# Patient Record
Sex: Male | Born: 1945
Health system: Southern US, Community
[De-identification: ages and names within clinical notes are randomized; demographics above are authoritative.]

## PROBLEM LIST (undated history)

## (undated) DIAGNOSIS — K08109 Complete loss of teeth, unspecified cause, unspecified class: Secondary | ICD-10-CM

## (undated) DIAGNOSIS — N400 Enlarged prostate without lower urinary tract symptoms: Secondary | ICD-10-CM

## (undated) DIAGNOSIS — I4819 Other persistent atrial fibrillation: Secondary | ICD-10-CM

## (undated) DIAGNOSIS — D3502 Benign neoplasm of left adrenal gland: Secondary | ICD-10-CM

## (undated) DIAGNOSIS — Z8719 Personal history of other diseases of the digestive system: Secondary | ICD-10-CM

## (undated) DIAGNOSIS — H409 Unspecified glaucoma: Secondary | ICD-10-CM

## (undated) DIAGNOSIS — D3501 Benign neoplasm of right adrenal gland: Secondary | ICD-10-CM

## (undated) DIAGNOSIS — N529 Male erectile dysfunction, unspecified: Secondary | ICD-10-CM

## (undated) DIAGNOSIS — Z86010 Personal history of colonic polyps: Secondary | ICD-10-CM

## (undated) DIAGNOSIS — K5730 Diverticulosis of large intestine without perforation or abscess without bleeding: Secondary | ICD-10-CM

## (undated) DIAGNOSIS — Z9189 Other specified personal risk factors, not elsewhere classified: Secondary | ICD-10-CM

## (undated) DIAGNOSIS — M1990 Unspecified osteoarthritis, unspecified site: Secondary | ICD-10-CM

## (undated) DIAGNOSIS — Z7901 Long term (current) use of anticoagulants: Secondary | ICD-10-CM

## (undated) DIAGNOSIS — R011 Cardiac murmur, unspecified: Secondary | ICD-10-CM

## (undated) DIAGNOSIS — I4891 Unspecified atrial fibrillation: Secondary | ICD-10-CM

## (undated) DIAGNOSIS — K219 Gastro-esophageal reflux disease without esophagitis: Secondary | ICD-10-CM

## (undated) DIAGNOSIS — Z860101 Personal history of adenomatous and serrated colon polyps: Secondary | ICD-10-CM

## (undated) DIAGNOSIS — E119 Type 2 diabetes mellitus without complications: Secondary | ICD-10-CM

## (undated) DIAGNOSIS — Z972 Presence of dental prosthetic device (complete) (partial): Secondary | ICD-10-CM

## (undated) DIAGNOSIS — E785 Hyperlipidemia, unspecified: Secondary | ICD-10-CM

## (undated) DIAGNOSIS — I10 Essential (primary) hypertension: Secondary | ICD-10-CM

## (undated) DIAGNOSIS — I491 Atrial premature depolarization: Secondary | ICD-10-CM

## (undated) DIAGNOSIS — G629 Polyneuropathy, unspecified: Secondary | ICD-10-CM

## (undated) DIAGNOSIS — C679 Malignant neoplasm of bladder, unspecified: Secondary | ICD-10-CM

## (undated) HISTORY — DX: Malignant neoplasm of bladder, unspecified: C67.9

## (undated) HISTORY — PX: COLONOSCOPY W/ POLYPECTOMY: SHX1380

## (undated) HISTORY — PX: COLONOSCOPY WITH ESOPHAGOGASTRODUODENOSCOPY (EGD): SHX5779

## (undated) HISTORY — DX: Unspecified atrial fibrillation: I48.91

---

## 2002-03-23 ENCOUNTER — Encounter: Admission: RE | Admit: 2002-03-23 | Discharge: 2002-06-21 | Payer: Self-pay | Admitting: Internal Medicine

## 2002-06-07 ENCOUNTER — Ambulatory Visit (HOSPITAL_COMMUNITY): Admission: RE | Admit: 2002-06-07 | Discharge: 2002-06-07 | Payer: Self-pay | Admitting: Gastroenterology

## 2002-06-07 ENCOUNTER — Encounter (INDEPENDENT_AMBULATORY_CARE_PROVIDER_SITE_OTHER): Payer: Self-pay | Admitting: *Deleted

## 2003-05-24 ENCOUNTER — Inpatient Hospital Stay (HOSPITAL_COMMUNITY): Admission: RE | Admit: 2003-05-24 | Discharge: 2003-05-26 | Payer: Self-pay | Admitting: Neurological Surgery

## 2003-05-24 HISTORY — PX: ANTERIOR CERVICAL DECOMP/DISCECTOMY FUSION: SHX1161

## 2003-06-18 ENCOUNTER — Encounter: Admission: RE | Admit: 2003-06-18 | Discharge: 2003-06-18 | Payer: Self-pay | Admitting: Neurological Surgery

## 2003-07-16 ENCOUNTER — Encounter: Admission: RE | Admit: 2003-07-16 | Discharge: 2003-07-16 | Payer: Self-pay | Admitting: Neurological Surgery

## 2003-08-27 ENCOUNTER — Encounter: Admission: RE | Admit: 2003-08-27 | Discharge: 2003-08-27 | Payer: Self-pay | Admitting: Neurological Surgery

## 2003-11-20 ENCOUNTER — Encounter: Admission: RE | Admit: 2003-11-20 | Discharge: 2003-11-20 | Payer: Self-pay | Admitting: Neurological Surgery

## 2004-07-25 IMAGING — CR DG CERVICAL SPINE 1V
1 series · 1 of 1 positions shown · non-contrast
Comparison: none

CLINICAL DATA: Left arm pain and numbness.  Cervical fusion on 05/24/03.
 SINGLE VIEW CERVICAL SPINE ? 08/27/03 
 Comparison 07/16/03.
 A single lateral view of the cervical spine again demonstrates interbody bone plug and anterior screw and plate fusion at the C4 through C7 levels with normal alignment.  
 IMPRESSION
 Stable anterior cervical fusion at the C4 through C7 levels with normal alignment.

[view not recorded]
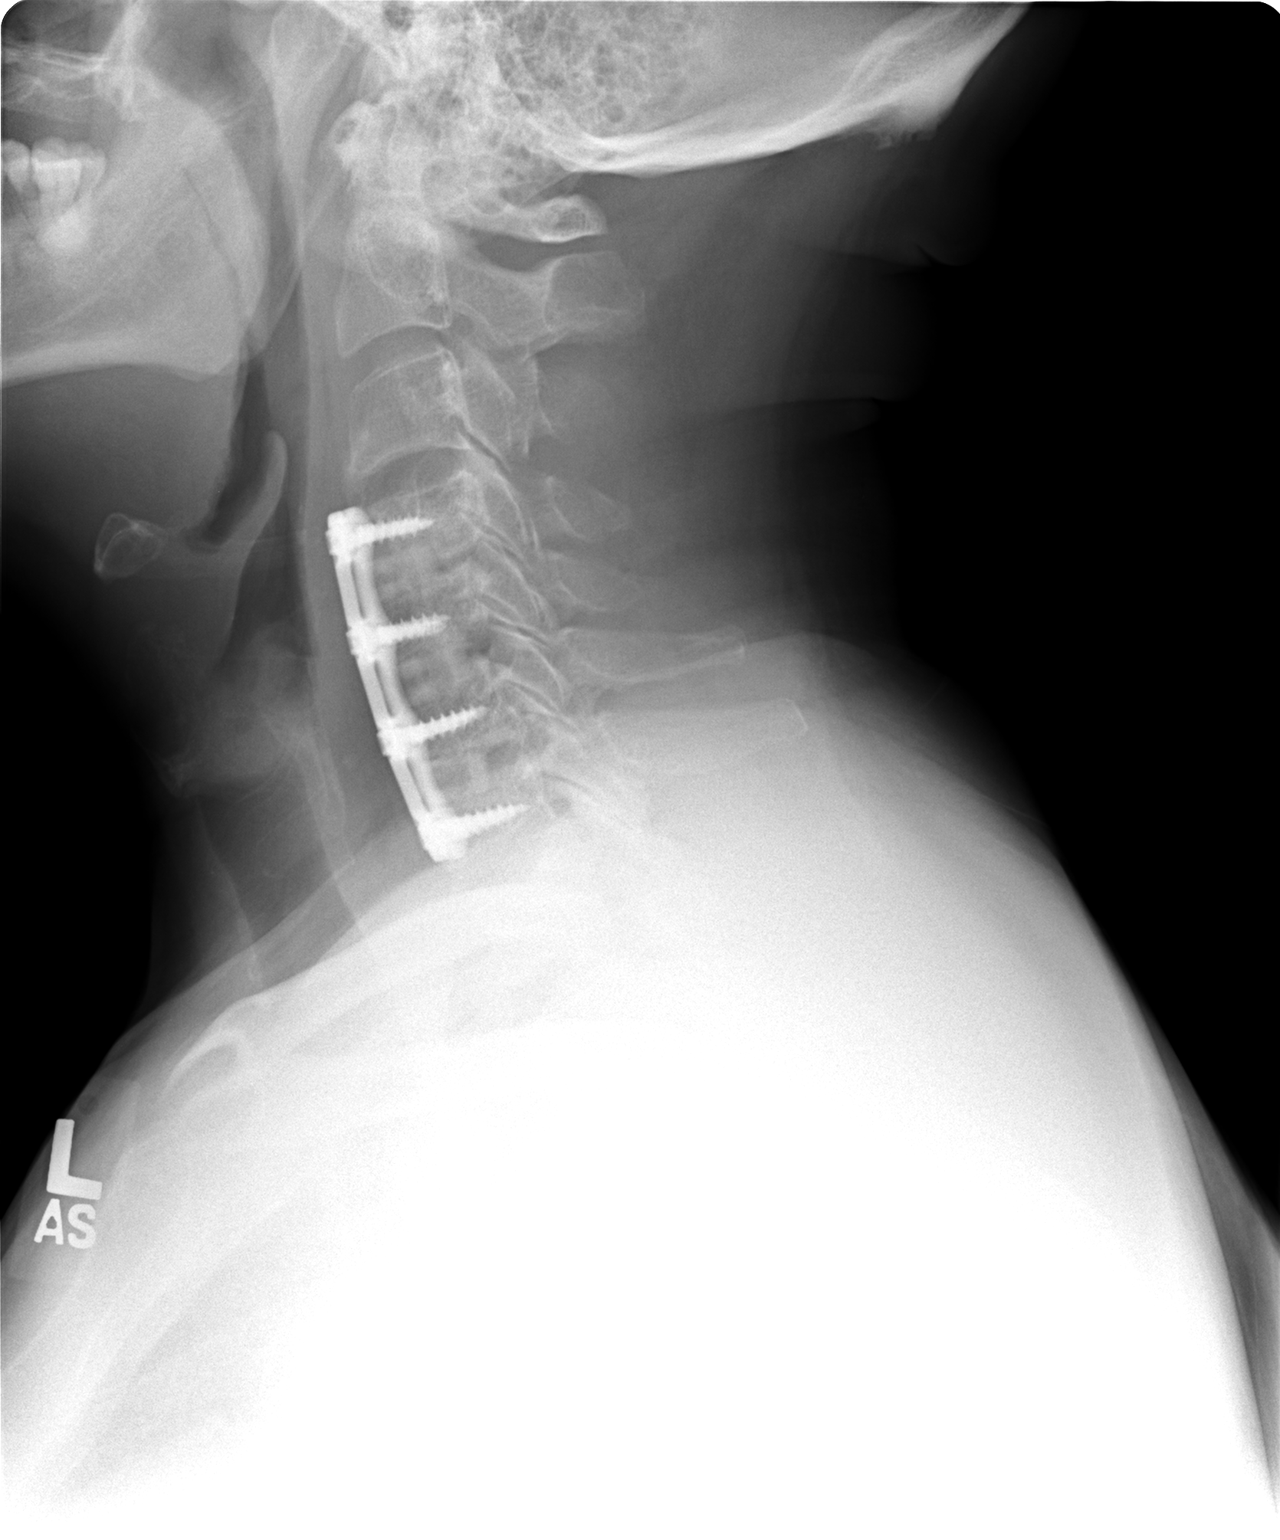

[1 of 1 positions shown; findings below may reference images not displayed]

## 2007-05-23 ENCOUNTER — Encounter: Payer: Self-pay | Admitting: Internal Medicine

## 2007-05-23 LAB — HM COLONOSCOPY

## 2008-11-09 ENCOUNTER — Encounter: Payer: Self-pay | Admitting: Internal Medicine

## 2008-11-09 LAB — CONVERTED CEMR LAB
ALT: 18 U/L
AST: 18 U/L
Albumin: 4.5 g/dL
Alkaline Phosphatase: 82 U/L
BUN: 12 mg/dL
CO2: 26 meq/L
Calcium: 10.3 mg/dL
Chloride: 99 meq/L
Cholesterol: 150 mg/dL
Creatinine, Ser: 0.96 mg/dL
Glucose, Bld: 148 mg/dL
HDL: 46 mg/dL
Hgb A1c MFr Bld: 6.7 %
LDL Cholesterol: 78 mg/dL
Potassium: 4.1 meq/L
Sodium: 140 meq/L
Total Bilirubin: 0.5 mg/dL
Total Protein: 7.3 g/dL
Triglyceride fasting, serum: 129 mg/dL

## 2009-03-15 ENCOUNTER — Encounter: Payer: Self-pay | Admitting: Internal Medicine

## 2009-03-15 LAB — CONVERTED CEMR LAB: Hgb A1c MFr Bld: 6.2 %

## 2009-05-31 ENCOUNTER — Encounter: Payer: Self-pay | Admitting: Internal Medicine

## 2009-05-31 LAB — CONVERTED CEMR LAB
ALT: 17 U/L
AST: 23 U/L
Albumin: 4.4 g/dL
Alkaline Phosphatase: 73 U/L
BUN: 15 mg/dL
CO2: 21 meq/L
Calcium: 10.3 mg/dL
Chloride: 103 meq/L
Cholesterol: 140 mg/dL
Creatinine, Ser: 1.02 mg/dL
Glucose, Bld: 109 mg/dL
HDL: 44 mg/dL
LDL Cholesterol: 68 mg/dL
Potassium: 3.4 meq/L
Sodium: 141 meq/L
Total Bilirubin: 0.6 mg/dL
Total Protein: 7.2 g/dL
Triglyceride fasting, serum: 138 mg/dL

## 2009-12-23 ENCOUNTER — Ambulatory Visit: Payer: Self-pay | Admitting: Internal Medicine

## 2009-12-23 DIAGNOSIS — Z860100 Personal history of colon polyps, unspecified: Secondary | ICD-10-CM | POA: Insufficient documentation

## 2009-12-23 DIAGNOSIS — E785 Hyperlipidemia, unspecified: Secondary | ICD-10-CM | POA: Insufficient documentation

## 2009-12-23 DIAGNOSIS — Z8719 Personal history of other diseases of the digestive system: Secondary | ICD-10-CM | POA: Insufficient documentation

## 2009-12-23 DIAGNOSIS — E1169 Type 2 diabetes mellitus with other specified complication: Secondary | ICD-10-CM | POA: Insufficient documentation

## 2009-12-23 DIAGNOSIS — I10 Essential (primary) hypertension: Secondary | ICD-10-CM | POA: Insufficient documentation

## 2009-12-23 DIAGNOSIS — E119 Type 2 diabetes mellitus without complications: Secondary | ICD-10-CM | POA: Insufficient documentation

## 2009-12-23 DIAGNOSIS — E1159 Type 2 diabetes mellitus with other circulatory complications: Secondary | ICD-10-CM | POA: Insufficient documentation

## 2009-12-23 LAB — CONVERTED CEMR LAB
ALT: 16 U/L
AST: 21 U/L
Albumin: 4.1 g/dL
Alkaline Phosphatase: 59 U/L
BUN: 12 mg/dL
Bilirubin, Direct: 0.2 mg/dL
CO2: 30 meq/L
Calcium: 10 mg/dL
Chloride: 101 meq/L
Cholesterol: 141 mg/dL
Creatinine, Ser: 0.9 mg/dL
GFR calc non Af Amer: 103.57 mL/min
Glucose, Bld: 125 mg/dL — ABNORMAL HIGH
HDL: 42.4 mg/dL
Hgb A1c MFr Bld: 6.7 % — ABNORMAL HIGH
LDL Cholesterol: 76 mg/dL
Potassium: 3.9 meq/L
Sodium: 141 meq/L
Total Bilirubin: 1 mg/dL
Total CHOL/HDL Ratio: 3
Total Protein: 6.8 g/dL
Triglycerides: 114 mg/dL
VLDL: 22.8 mg/dL

## 2009-12-23 LAB — HM DIABETES FOOT EXAM: HM Diabetic Foot Exam: NORMAL

## 2009-12-26 ENCOUNTER — Telehealth: Payer: Self-pay | Admitting: Internal Medicine

## 2010-01-02 ENCOUNTER — Encounter: Payer: Self-pay | Admitting: Internal Medicine

## 2010-03-09 DIAGNOSIS — Z8619 Personal history of other infectious and parasitic diseases: Secondary | ICD-10-CM

## 2010-03-09 HISTORY — DX: Personal history of other infectious and parasitic diseases: Z86.19

## 2010-03-26 ENCOUNTER — Ambulatory Visit
Admission: RE | Admit: 2010-03-26 | Discharge: 2010-03-26 | Payer: Self-pay | Source: Home / Self Care | Attending: Internal Medicine | Admitting: Internal Medicine

## 2010-03-26 ENCOUNTER — Other Ambulatory Visit: Payer: Self-pay | Admitting: Internal Medicine

## 2010-03-26 LAB — HEMOGLOBIN A1C: Hgb A1c MFr Bld: 6.8 % — ABNORMAL HIGH (ref 4.6–6.5)

## 2010-04-08 NOTE — Progress Notes (Signed)
Summary: COLONOSCOPY  Phone Note From Other Clinic   Caller: Dr Kenna Gilbert office  Summary of Call: Pt had colon w/Dr Loreta Ave in 2007 and is due 05/08/2010. They will fax colon and path report to my attention to document EMR.  Initial call taken by: Lamar Sprinkles, CMA,  December 26, 2009 2:32 PM  Follow-up for Phone Call        Info entered into EMR, report set to be scanned Follow-up by: Lamar Sprinkles, CMA,  December 26, 2009 4:45 PM  Additional Follow-up for Phone Call Additional follow up Details #1::        ok noted - thanks Additional Follow-up by: Newt Lukes MD,  December 27, 2009 10:12 AM         Preventive Care Screening  Colonoscopy:    Date:  05/23/2007    Next Due:  06/2010    Results:  Adenomatous Polyp

## 2010-04-08 NOTE — Procedures (Signed)
Summary: Colonoscopy/[facility]  Colonoscopy/[facility]   Imported By: Sherian Rein 01/01/2010 07:39:01  _____________________________________________________________________  External Attachment:    Type:   Image     Comment:   External Document

## 2010-04-08 NOTE — Assessment & Plan Note (Signed)
Summary: NEW PT/MEDICARE/#/LB   Vital Signs:  Patient profile:   65 year old male Height:      67 inches (170.18 cm) Weight:      204.4 pounds (92.91 kg) BMI:     32.13 O2 Sat:      96 % on Room air Temp:     98.5 degrees F (36.94 degrees C) oral Pulse rate:   81 / minute BP sitting:   140 / 72  (left arm) Cuff size:   large  Vitals Entered By: Orlan Leavens RMA (December 23, 2009 8:12 AM)  O2 Flow:  Room air CC: New patient Is Patient Diabetic? Yes Did you bring your meter with you today? No Pain Assessment Patient in pain? no        Primary Care Provider:  Newt Lukes MD  CC:  New patient.  History of Present Illness: new pt to me and our practice, here to est care prev followed at [facility] [place]  1) DM2 - reports compliance with ongoing medical treatment and no changes in medication dose or frequency. denies adverse side effects related to current therapy. checks sugars  <1 x/d, no signs or symptoms hypoglycemia  2) HTN - reports compliance with ongoing medical treatment and no changes in medication dose or frequency. denies adverse side effects related to current therapy.  no edema, HA or CP  3) dyslipidemia - on statin tx since 2009 - reports compliance with ongoing medical treatment and no changes in medication dose or frequency. denies adverse side effects related to current therapy.  no GI or muscle c/o  Preventive Screening-Counseling & Management  Alcohol-Tobacco     Alcohol drinks/day: <1     Smoking Status: current     Smoking Cessation Counseling: yes     Tobacco Counseling: to quit use of tobacco products  Caffeine-Diet-Exercise     Caffeine Counseling: not indicated; caffeine use is not excessive or problematic     Does Patient Exercise: no     Exercise Counseling: to improve exercise regimen  Safety-Violence-Falls     Seat Belt Counseling: not indicated; patient wears seat belts     Helmet Counseling: not applicable     Firearm  Counseling: not indicated; uses recommended firearm safety measures     Violence Counseling: not indicated; no violence risk noted     Fall Risk Counseling: not indicated; no significant falls noted  Clinical Review Panels:  Prevention   Last Colonoscopy:  pt states was done about 3 years ago polyp removed (03/09/2006)   Current Medications (verified): 1)  Metformin Hcl 500 Mg Tabs (Metformin Hcl) .... Take 1 Two Times A Day 2)  Lipitor 20 Mg Tabs (Atorvastatin Calcium) .... Take 1 By Mouth Once Daily 3)  Diltiazem Hcl Er Beads 180 Mg Xr24h-Cap (Diltiazem Hcl Er Beads) .... Take 1 Two Times A Day 4)  Lisinopril-Hydrochlorothiazide 20-12.5 Mg Tabs (Lisinopril-Hydrochlorothiazide) .... Take 1 Two Times A Day 5)  Terazosin Hcl 10 Mg Caps (Terazosin Hcl) .... 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 I have reviewed all other systems and they were negative.   Physical Exam  General:  alert, well-developed, well-nourished, and cooperative to examination.    Eyes:  vision grossly intact; pupils equal, round and reactive to light.  conjunctiva and lids normal.    Ears:  normal pinnae bilaterally, without erythema, swelling, or tenderness to palpation. TMs clear, without effusion, or cerumen impaction. Hearing grossly  normal bilaterally  Mouth:  teeth and gums in good repair; mucous membranes moist, without lesions or ulcers. oropharynx clear without exudate, no erythema.  Neck:  supple, full ROM, no masses, no thyromegaly; no thyroid nodules or tenderness. no JVD or carotid bruits.   Lungs:  normal respiratory effort, no intercostal retractions or use of accessory muscles; normal breath sounds bilaterally - no crackles and no wheezes.    Heart:  normal rate, regular rhythm, no murmur, and no rub. BLE without edema. normal DP pulses and normal cap refill in all 4 extremities    Abdomen:  soft, non-tender, normal bowel sounds, no distention; no masses and no appreciable hepatomegaly or splenomegaly.   Msk:  No deformity or scoliosis noted of thoracic or lumbar spine.   Neurologic:  alert & oriented X3 and cranial nerves II-XII symetrically intact.  strength normal in all extremities, sensation intact to light touch, and gait normal. speech fluent without dysarthria or aphasia; follows commands with good comprehension.  Skin:  no rashes, vesicles, ulcers, or erythema. No nodules or irregularity to palpation.  Psych:  Oriented X3, memory intact for recent and remote, normally interactive, good eye contact, not anxious appearing, not depressed appearing, and not agitated.     Diabetes Management Exam:    Foot Exam (with socks and/or shoes not present):       Sensory-Pinprick/Light touch:          Left medial foot (L-4): normal          Left dorsal foot (L-5): normal          Left lateral foot (S-1): normal          Right medial foot (L-4): normal          Right dorsal foot (L-5): normal          Right lateral foot (S-1): normal       Sensory-Monofilament:          Left foot: normal          Right foot: normal       Inspection:          Left foot: normal          Right foot: normal       Nails:          Left foot: normal          Right foot: normal   Impression & Recommendations:  Problem # 1:   HYPERTENSION (ICD-401.9)  His updated medication list for this problem includes:    Diltiazem Hcl Er Beads 180 Mg Xr24h-cap (Diltiazem hcl er beads) .Marland Kitchen... Take 1 two times a day    Lisinopril-hydrochlorothiazide 20-12.5 Mg Tabs (Lisinopril-hydrochlorothiazide) .Marland Kitchen... Take 1 two times a day    Terazosin Hcl 10 Mg Caps (Terazosin hcl) .Marland Kitchen... Take 1 by mouth once daily  Orders: TLB-BMP (Basic Metabolic Panel-BMET) (80048-METABOL)  BP today: 140/72  Problem # 2:  HYPERLIPIDEMIA (ICD-272.4) send for prior records to review His updated medication list for this problem includes:    Lipitor 20 Mg Tabs (Atorvastatin calcium) .Marland Kitchen... Take 1 by mouth once daily  Orders: TLB-Lipid Panel (80061-LIPID)  Problem # 3:  DIABETES MELLITUS, TYPE II (ICD-250.00) send for prior records to review  His updated medication list for this problem includes:    Metformin Hcl 500 Mg Tabs (Metformin hcl) .Marland Kitchen... Take 1 two times a day    Lisinopril-hydrochlorothiazide 20-12.5 Mg Tabs (Lisinopril-hydrochlorothiazide) .Marland Kitchen... Take 1 two times a day  Orders: TLB-A1C / Hgb A1C (Glycohemoglobin) (83036-A1C)  Problem # 4:  FAMILY HISTORY OF COLON CA 1ST DEGREE RELATIVE <60 (ICD-V16.0) reports screenng q76yrs, overdue now send for prior records and refer back to GI as per pt schedule (dr. Loreta Ave)   Orders: Gastroenterology Referral (GI)  Complete Medication List: 1)  Metformin Hcl 500 Mg Tabs (Metformin hcl) .... Take 1 two times a day 2)  Lipitor 20 Mg Tabs (Atorvastatin calcium) .... Take 1 by mouth once daily 3)  Diltiazem Hcl Er Beads 180 Mg Xr24h-cap (Diltiazem hcl er beads) .... Take 1 two times a day 4)  Lisinopril-hydrochlorothiazide 20-12.5 Mg Tabs (Lisinopril-hydrochlorothiazide) .... Take 1 two times a day 5)  Terazosin Hcl 10 Mg Caps (Terazosin hcl) .... Take 1 by mouth once daily  Other Orders: TLB-Hepatic/Liver Function Pnl (80076-HEPATIC)  Contraindications/Deferment of Procedures/Staging:     Test/Procedure: FLU VAX    Reason for deferment: patient declined   Patient Instructions: 1)  it was good to see you today. 2)  medications and history reviewed - no changes recommended at this time - refills done 90 days 3)  test(s) ordered today - your results will be posted on the phone tree for review in 48-72 hours from the time of test completion; call [phone] and enter your 9 digit MRN (listed above on this page, just below your name); if any changes need to be made or there are abnormal results, you will be contacted directly. 4)  will send for records from [facility] to review  5)  we'll make referral to dr. Loreta Ave for your colonoscopy . Our office will contact you regarding this appointment once made.  6)  Tobacco is very bad for your health and your loved ones! You Should stop smoking! 7)  Please schedule a follow-up appointment in 3-4 months to recheck diabetes, call sooner if problems.  Prescriptions: TERAZOSIN HCL 10 MG CAPS (TERAZOSIN HCL) take 1 by mouth once daily  #90 x 1   Entered and Authorized by:   Newt Lukes MD   Signed by:   Newt Lukes MD on 12/23/2009   Method used:   Electronically to        [facility] [address] [phone]* (retail)       [address]       [place], [place]  [address]       Ph: [phone]       Fax: [phone]   RxID:   [id number] LISINOPRIL-HYDROCHLOROTHIAZIDE 20-12.5 MG TABS (LISINOPRIL-HYDROCHLOROTHIAZIDE) take 1 two times a day  #180 x 1   Entered and Authorized by:   Newt Lukes MD   Signed by:   Newt Lukes MD on 12/23/2009   Method used:   Electronically to        [facility] [address] [phone]* (retail)       [address]  [place], [place]  [address]       Ph: [phone]       Fax: [phone]   RxID:   [id number] DILTIAZEM HCL ER BEADS 180 MG XR24H-CAP (DILTIAZEM HCL ER BEADS) take 1 two times a day  #180 x 1   Entered and Authorized by:   Newt Lukes MD   Signed by:   Newt Lukes MD on 12/23/2009   Method used:   Electronically to        [facility] [phone]* (retail)       [address]       [place], [place]  [address]       Ph: [phone]       Fax: [phone]   RxID:   [id number] LIPITOR 20 MG TABS (ATORVASTATIN CALCIUM) take 1 by mouth once daily  #90 x 1   Entered and Authorized by:   Newt Lukes MD   Signed by:   Newt Lukes MD on 12/23/2009   Method used:   Electronically to        [facility] [phone]* (retail)       [address]       [place], [place]  [address]       Ph: [phone]       Fax: [phone]   RxID:   [id number] METFORMIN HCL 500 MG TABS (METFORMIN HCL) take 1 two times a day  #180 x 1   Entered and Authorized by:   Newt Lukes MD   Signed by:   Newt Lukes MD on 12/23/2009   Method used:   Electronically to        [facility] [phone]* (retail)       [address]       [place], [place]  [address]       Ph: [phone]       Fax: [phone]   RxID:   [id number]    Orders Added: 1)  TLB-A1C / Hgb A1C (Glycohemoglobin) [83036-A1C] 2)  TLB-Lipid Panel [80061-LIPID] 3)  TLB-Hepatic/Liver Function Pnl [80076-HEPATIC] 4)  TLB-BMP (Basic Metabolic Panel-BMET) [80048-METABOL] 5)  Gastroenterology Referral [GI] 6)  New Patient Level IV [26712]     Colonoscopy  Procedure date:  03/09/2006  Findings:      pt states was done about 3 years ago polyp removed

## 2010-04-08 NOTE — Procedures (Signed)
Summary: Jaci Lazier MD  Jaci Lazier MD   Imported By: Lester Kibler 01/23/2010 10:22:10  _____________________________________________________________________  External Attachment:    Type:   Image     Comment:   External Document

## 2010-04-10 NOTE — Assessment & Plan Note (Signed)
Summary: 3-4 MTH FU---STC   Vital Signs:  Patient profile:   65 year old male Height:      67 inches (170.18 cm) Weight:      204.4 pounds (92.91 kg) O2 Sat:      97 % on Room air Temp:     98.8 degrees F (37.11 degrees C) oral Pulse rate:   81 / minute BP sitting:   122 / 72  (left arm) Cuff size:   large  Vitals Entered By: Orlan Leavens RMA (March 26, 2010 10:24 AM)  O2 Flow:  Room air CC: 3 month follow-up Is Patient Diabetic? Yes Did you bring your meter with you today? No Pain Assessment Patient in pain? no        Primary Care Provider:  Newt Lukes MD  CC:  3 month follow-up.  History of Present Illness: here for f/u  1) DM2 - reports compliance with ongoing medical treatment and no changes in medication dose or frequency. denies adverse side effects related to current therapy. checks sugars  <1 x/d, no signs or symptoms hypoglycemia  2) HTN - reports compliance with ongoing medical treatment and no changes in medication dose or frequency. denies adverse side effects related to current therapy.  no edema, HA or CP  3) dyslipidemia - on statin tx since 2009 - reports compliance with ongoing medical treatment and no changes in medication dose or frequency. denies adverse side effects related to current therapy.  no GI or muscle c/o  Clinical Review Panels:  Lipid Management   Cholesterol:  141 (12/23/2009)   LDL (bad choesterol):  76 (12/23/2009)   HDL (good cholesterol):  42.40 (12/23/2009)   Triglycerides:  138 (05/31/2009)  Diabetes Management   HgBA1C:  6.7 (12/23/2009)   Creatinine:  0.9 (12/23/2009)   Last Foot Exam:  yes (12/23/2009)  Complete Metabolic Panel   Glucose:  125 (12/23/2009)   Sodium:  141 (12/23/2009)   Potassium:  3.9 (12/23/2009)   Chloride:  101 (12/23/2009)   CO2:  30 (12/23/2009)   BUN:  12 (12/23/2009)   Creatinine:  0.9 (12/23/2009)   Albumin:  4.1 (12/23/2009)   Total Protein:  6.8 (12/23/2009)   Calcium:  10.0  (12/23/2009)   Total Bili:  1.0 (12/23/2009)   Alk Phos:  59 (12/23/2009)   SGPT (ALT):  16 (12/23/2009)   SGOT (AST):  21 (12/23/2009)   Current Medications (verified): 1)  Metformin Hcl 500 Mg Tabs (Metformin Hcl) .... Take 1 Two Times A Day 2)  Lipitor 20 Mg Tabs (Atorvastatin Calcium) .... Take 1 By Mouth Once Daily 3)  Diltiazem Hcl Er Beads 180 Mg Xr24h-Cap (Diltiazem Hcl Er Beads) .... Take 1 Two Times A Day 4)  Lisinopril-Hydrochlorothiazide 20-12.5 Mg Tabs (Lisinopril-Hydrochlorothiazide) .... Take 1 Two Times A Day 5)  Terazosin Hcl 10 Mg Caps (Terazosin Hcl) .... Take 1 By Mouth Once Daily 6)  Aspirin 81 Mg Tbec (Aspirin) .... Take 1 By Mouth Once Daily  Allergies (verified): No Known Drug Allergies  Past History:  Past Medical History: Diabetes mellitus, type II Diverticulitis, hx  Hyperlipidemia Hypertension Colonic polyps, hx   MD roster: GI - mann  Review of Systems  The patient denies weight loss, weight gain, chest pain, syncope, and headaches.    Physical Exam  General:  alert, well-developed, well-nourished, and cooperative to examination.    Lungs:  normal respiratory effort, no intercostal retractions or use of accessory muscles; normal breath sounds bilaterally - no crackles and  no wheezes.    Heart:  normal rate, regular rhythm, no murmur, and no rub. BLE without edema.  Msk:  1.5cm round ganglion cyst on thumb side of left wrist flexor surface   Impression & Recommendations:  Problem # 1:  DIABETES MELLITUS, TYPE II (ICD-250.00)  His updated medication list for this problem includes:    Metformin Hcl 500 Mg Tabs (Metformin hcl) .Marland Kitchen... Take 1 two times a day    Lisinopril-hydrochlorothiazide 20-12.5 Mg Tabs (Lisinopril-hydrochlorothiazide) .Marland Kitchen... Take 1 two times a day    Aspirin 81 Mg Tbec (Aspirin) .Marland Kitchen... Take 1 by mouth once daily  Orders: TLB-A1C / Hgb A1C (Glycohemoglobin) (83036-A1C)  Labs Reviewed: Creat: 0.9 (12/23/2009)    Reviewed  HgBA1c results: 6.7 (12/23/2009)  6.2 (03/15/2009)  Problem # 2:  HYPERTENSION (ICD-401.9)  His updated medication list for this problem includes:    Diltiazem Hcl Er Beads 180 Mg Xr24h-cap (Diltiazem hcl er beads) .Marland Kitchen... Take 1 two times a day    Lisinopril-hydrochlorothiazide 20-12.5 Mg Tabs (Lisinopril-hydrochlorothiazide) .Marland Kitchen... Take 1 two times a day    Terazosin Hcl 10 Mg Caps (Terazosin hcl) .Marland Kitchen... Take 1 by mouth once daily  BP today: 122/72 Prior BP: 140/72 (12/23/2009)  Labs Reviewed: K+: 3.9 (12/23/2009) Creat: : 0.9 (12/23/2009)   Chol: 141 (12/23/2009)   HDL: 42.40 (12/23/2009)   LDL: 76 (12/23/2009)   TG: 114.0 (12/23/2009)  Problem # 3:  HYPERLIPIDEMIA (ICD-272.4)  His updated medication list for this problem includes:    Lipitor 20 Mg Tabs (Atorvastatin calcium) .Marland Kitchen... Take 1 by mouth once daily  Labs Reviewed: SGOT: 21 (12/23/2009)   SGPT: 16 (12/23/2009)   HDL:42.40 (12/23/2009), 44 (05/31/2009)  LDL:76 (12/23/2009), 68 (05/31/2009)  Chol:141 (12/23/2009), 140 (05/31/2009)  Trig:114.0 (12/23/2009), 138 (05/31/2009)  Problem # 4:  FAMILY HISTORY OF COLON CA 1ST DEGREE RELATIVE <60 (ICD-V16.0)  refer back to GI as per pt schedule (dr. Loreta Ave) - due 05/2010  Orders: Gastroenterology Referral (GI)  Complete Medication List: 1)  Metformin Hcl 500 Mg Tabs (Metformin hcl) .... Take 1 two times a day 2)  Lipitor 20 Mg Tabs (Atorvastatin calcium) .... Take 1 by mouth once daily 3)  Diltiazem Hcl Er Beads 180 Mg Xr24h-cap (Diltiazem hcl er beads) .... Take 1 two times a day 4)  Lisinopril-hydrochlorothiazide 20-12.5 Mg Tabs (Lisinopril-hydrochlorothiazide) .... Take 1 two times a day 5)  Terazosin Hcl 10 Mg Caps (Terazosin hcl) .... Take 1 by mouth once daily 6)  Aspirin 81 Mg Tbec (Aspirin) .... Take 1 by mouth once daily  Patient Instructions: 1)  it was good to see you today. 2)  test(s) ordered today - your results will be posted on the phone tree for review in 48-72  hours from the time of test completion; call [phone] and enter your 9 digit MRN (listed above on this page, just below your name); if any changes need to be made or there are abnormal results, you will be contacted directly.  3)  no medication changes today 4)  we'll make referral to Dr. Loreta Ave for your colonoscopy in 05/2010. Our office will contact you regarding this appointment once made.  5)  Please schedule a follow-up appointment in 3-4 months to monitor diabetes, call sooner if problems.    Orders Added: 1)  Gastroenterology Referral [GI] 2)  TLB-A1C / Hgb A1C (Glycohemoglobin) [83036-A1C] 3)  Est. Patient Level IV [13086]

## 2010-05-26 LAB — CONVERTED CEMR LAB: Hgb A1c MFr Bld: 6.8 % — ABNORMAL HIGH

## 2010-05-28 ENCOUNTER — Encounter: Payer: Self-pay | Admitting: *Deleted

## 2010-06-03 ENCOUNTER — Encounter: Payer: Self-pay | Admitting: Internal Medicine

## 2010-07-02 ENCOUNTER — Other Ambulatory Visit (INDEPENDENT_AMBULATORY_CARE_PROVIDER_SITE_OTHER): Payer: Medicare HMO

## 2010-07-02 ENCOUNTER — Ambulatory Visit (INDEPENDENT_AMBULATORY_CARE_PROVIDER_SITE_OTHER): Payer: Medicare HMO | Admitting: Internal Medicine

## 2010-07-02 ENCOUNTER — Encounter: Payer: Self-pay | Admitting: Internal Medicine

## 2010-07-02 DIAGNOSIS — I10 Essential (primary) hypertension: Secondary | ICD-10-CM

## 2010-07-02 DIAGNOSIS — Z23 Encounter for immunization: Secondary | ICD-10-CM

## 2010-07-02 DIAGNOSIS — E119 Type 2 diabetes mellitus without complications: Secondary | ICD-10-CM

## 2010-07-02 DIAGNOSIS — E785 Hyperlipidemia, unspecified: Secondary | ICD-10-CM

## 2010-07-02 LAB — HEMOGLOBIN A1C: Hgb A1c MFr Bld: 6.8 % — ABNORMAL HIGH (ref 4.6–6.5)

## 2010-07-02 NOTE — Progress Notes (Signed)
  Subjective:    Patient ID: Colin Love, male    DOB: [birth year], 65 y.o.   MRN: [id number]  HPI  here for follow up - reviewed chronic medical issues today:  DM2 - reports compliance with ongoing medical treatment and no changes in medication dose or frequency. denies adverse side effects related to current therapy. checks sugars  <1 x/d, no signs or symptoms hypoglycemia  HTN - reports compliance with ongoing medical treatment and no changes in medication dose or frequency. denies adverse side effects related to current therapy.  no edema, HA or CP  dyslipidemia - on statin tx since 2009 - reports compliance with ongoing medical treatment and no changes in medication dose or frequency. denies adverse side effects related to current therapy.  no GI or muscle c/o  Past Medical History  Diagnosis Date  . COLONIC POLYPS, HX OF   . DIVERTICULITIS, HX OF   . DIABETES MELLITUS, TYPE II   . HYPERLIPIDEMIA   . HYPERTENSION      Review of Systems  Constitutional: Negative for unexpected weight change.  Respiratory: Negative for cough.   Cardiovascular: Negative for chest pain.  Neurological: Negative for headaches.       Objective:   Physical Exam  Constitutional: He is oriented to person, place, and time. He appears well-developed and well-nourished.  Cardiovascular: Normal rate, regular rhythm and normal heart sounds.   Pulmonary/Chest: Effort normal and breath sounds normal. No respiratory distress.  Musculoskeletal:       2.5 cm ganglion cyst on left wrist, flexor surface thenar side - nontender  Neurological: He is alert and oriented to person, place, and time. No cranial nerve deficit.  Psychiatric: He has a normal mood and affect. His behavior is normal.   Lab Results  Component Value Date   CHOL 141 12/23/2009   TRIG 114.0 12/23/2009   HDL 42.40 12/23/2009   ALT 16 12/23/2009   AST 21 12/23/2009   NA 141 12/23/2009   K 3.9 12/23/2009   CL 101 12/23/2009   CREATININE 0.9 12/23/2009   BUN 12 12/23/2009   CO2 30 12/23/2009   HGBA1C 6.8* 03/26/2010       Assessment & Plan:  Pt will notify us if ganglion cyst causes any problems See problem list. Medications and labs reviewed today.

## 2010-07-02 NOTE — Assessment & Plan Note (Signed)
The current medical regimen is effective;  continue present plan and medications.  Check a1c today

## 2010-07-02 NOTE — Assessment & Plan Note (Signed)
On statin since 2009 - The current medical regimen is effective;  continue present plan and medications.

## 2010-07-02 NOTE — Patient Instructions (Addendum)
It was good to see you today. Test(s) ordered today. Your results will be called to you after review (48-72hours after test completion). If any changes need to be made, you will be notified at that time. Medications reviewed, no changes at this time. Tdap shot today - think about pneumonia shot and shingles shot as discussed Please schedule followup in 4-6 months, call sooner if problems.

## 2010-07-02 NOTE — Assessment & Plan Note (Signed)
The current medical regimen is effective;  continue present plan and medications.  BP Readings from Last 3 Encounters:  07/02/10 118/70  03/26/10 122/72  12/23/09 140/72

## 2010-07-03 ENCOUNTER — Telehealth: Payer: Self-pay | Admitting: Internal Medicine

## 2010-07-03 NOTE — Telephone Encounter (Signed)
Called pt no ansew left msg on personal cell md reponse concerning a1c...07/03/10@1 :11pm/LMB

## 2010-07-03 NOTE — Telephone Encounter (Signed)
Please call patient - normal a1c results. No medication changes recommended. Thanks.

## 2010-07-21 ENCOUNTER — Other Ambulatory Visit: Payer: Self-pay | Admitting: Internal Medicine

## 2010-07-25 LAB — HM COLONOSCOPY

## 2010-07-25 NOTE — Op Note (Signed)
NAME:  Colin Love, Colin Love                        ACCOUNT NO.:  [id number]   MEDICAL RECORD NO.:  [id number]                   PATIENT TYPE:  AMB   LOCATION:  ENDO                                 FACILITY:  [facility]   PHYSICIAN:  Anselmo Rod, M.D.               DATE OF BIRTH:  05/02/1945   DATE OF PROCEDURE:  06/07/2002  DATE OF DISCHARGE:                                 OPERATIVE REPORT   PROCEDURE PERFORMED:  Colonoscopy with snare polypectomy times 15 and cold  biopsies times 12.   ENDOSCOPIST:  Charna Elizabeth, M.D.   INSTRUMENT USED:  Olympus video colonoscope.   INDICATIONS FOR PROCEDURE:  The patient is a 65 year old African-American  male with a previous history of colonic polyps and a family history of colon  cancer in a first degree relative (mother) undergoing a repeat surveillance  colonoscopy to rule out recurrent polyps.   PREPROCEDURE PREPARATION:  Informed consent was procured from the patient.  The patient was fasted for eight hours prior to the procedure and prepped  with a bottle of magnesium citrate and a gallon of GoLYTELY the night prior  to the procedure.   PREPROCEDURE PHYSICAL:  The patient had stable vital signs.  Neck supple.  Chest clear to auscultation.  S1 and S2 regular.  Abdomen soft with normal  bowel sounds.   DESCRIPTION OF PROCEDURE:  The patient was placed in left lateral decubitus  position and sedated with 70 mg of Demerol and 7 mg of Versed intravenously.  Once the patient was adequately sedated and maintained on low flow oxygen  and continuous cardiac monitoring, the Olympus video colonoscope was  advanced from the rectum to the cecum with difficulty.  There was some  residual stool in the colon.  Multiple washes were done. Multiple small  sessile polyps were biopsied and removed by snare polypectomy from the  rectum and the rectosigmoid area.  There was a large pedunculated polyp that  was snared from 45 cm after injecting the base of the  polyp with 2 ml of  epinephrine.  A patch of erythema was biopsied to rule out dysphagia versus  adenomatous change.  This patch was at  65 cm.  Two polyps were snared from the transverse colon.  There was  evidence of right-sided diverticulosis with cecal diverticula present as  well.  A prominent anal papilla was seen on retroflexion.  Small internal  hemorrhoids were seen as well.  The patient tolerated the procedure well  without complications.   IMPRESSION:  1. Multiple colonic polyps in the rectum and rectosigmoid area.  2. A large pedunculated polyp in the left colon and two polyps in the     transverse colon removed (see description above).  3. Area of erythema biopsied at 65 cm rule out dysplasia versus adenomatous     change.  4. Right-sided diverticulosis with a few scattered diverticula in the  transverse colon.  Cecal diverticula noticed as well.  5. Some residual stool in the colon, multiple washes done.  6. Prominent anal papilla seen on retroflexion with small internal     hemorrhoids.   RECOMMENDATIONS:  1. Await pathology results.  2. Possible evaluation for removal of anal papilla by Adolph Pollack,     M.D.  3. Avoid all aspirin products including over-the-counter nonsteroidals for     the next three to four weeks.  4. High fiber diet with liberal fluid intake.  5. Outpatient follow-up in the next two weeks for further recommendations.                                                   Anselmo Rod, M.D.    JNM/MEDQ  D:  06/08/2002  T:  06/08/2002  Job:  [id number]   cc:   Gabriel Earing, M.D.  [address]  [place]  [place] [address]  Fax: [phone]   Adolph Pollack, M.D.  [address], [address]  [place]  [place] [address]  Fax: [phone]

## 2010-07-25 NOTE — Op Note (Signed)
NAME:  Colin Love, Colin Love NO.:  [id number]   MEDICAL RECORD NO.:  [id number]                   PATIENT TYPE:  OIB   LOCATION:  2899                                 FACILITY:  [facility]   PHYSICIAN:  Tia Alert, MD                  DATE OF BIRTH:  27-Jun-1945   DATE OF PROCEDURE:  05/24/2003  DATE OF DISCHARGE:                                 OPERATIVE REPORT   PREOPERATIVE DIAGNOSIS:  Cervical spondylitic myelopathy from cervical  spinal stenosis at C4-5, C5-6, and C6-7.   POSTOPERATIVE DIAGNOSIS:  Cervical spondylitic myelopathy from cervical  spinal stenosis at C4-5, C5-6, and C6-7.   PROCEDURES:  1. Decompressive anterior cervical diskectomy, C4-5, C5-6, and C6-7 for     spinal cord and nerve root decompression.  2. Anterior cervical fusion, C4-5, C5-6, and C6-7, utilizing 8 mm fibular     allografts.  3. Anterior cervical plating, C4 to C7, inclusive, utilizing a 60 mm     Atlantis Vision plate.   SURGEON:  Tia Alert, M.D.   ASSISTANT:  Reinaldo Meeker, M.D.   ANESTHESIA:  General endotracheal.   COMPLICATIONS:  None apparent.   INDICATION FOR PROCEDURE:  Colin Love is a 65 year old black male who  presented with right hand numbness and neck pain.  He was found to be  myelopathic on exam with hyperreflexia and mild spasticity.  He had an MRI,  which showed cervical spondylosis with stenosis at C4-5, C5-6, and C6-7, and  I recommended decompressive anterior diskectomy, fusion, and plating at C4-  5, C5-6, and C6-7.  He understood the risks, benefits, and alternatives and  wished to proceed.   DESCRIPTION OF PROCEDURE:  The patient was taken to the operating room and  after induction of adequate generalized endotracheal anesthesia, he was  placed in the supine position on the operating room table.  His right  anterior cervical region was prepped with Duraprep and then draped in the  usual sterile fashion.  Local anesthesia 5 mL was  injected and then an  incision was made and carried down to the platysma.  The platysma was  opened, elevated, and undermined with the Metzenbaum scissors.  I then  dissected in a plane medial to the sternocleidomastoid muscle and lateral to  the trachea and esophagus to expose the anterior cervical spine at C4-5, C5-  6, and C6-7.  Intraoperative fluoroscopy confirmed our level and then the  longus colli muscles were taken down bilaterally from C4 to C7 and then the  retractors were placed under this to expose the anterior cervical spine.  He  had a lot of anterior osteophytes, and these were removed with the Leksell  rongeur and the high-speed drill and then the disk spaces were incised at C4-  5, C5-6, and C6-7 and initial diskectomy was done with pituitary rongeurs  and curved curettes.  We then brought in  the drill and drilled the end  plates and the anterior osteophytes.  We drilled down to the level of the  posterior longitudinal ligament at each level.  Once this was done, we  brought in the operating microscope and the remainder of the procedure was  done under this.  At C4-5 we were able to open the posterior longitudinal  ligament and then remove this circumferentially along with the posterior  osteophytes for cord decompression.  We could see the cord pulsatile through  the dura.  We performed bilateral foraminotomies.  Once the decompression  was complete, we measured this to be 8 mm and placed an 8 mm fibular  allograft into the interspace for arthrodesis here.  We then did the same  procedure at C5-6 and C6-7.  We drilled the end plates at Z6-1 down to the  level of the posterior longitudinal ligament and once again opened this and  removed it circumferentially with the Kerrison punch and performed bilateral  foraminotomies.  At C6-7 we did the exact same thing.  We drilled the end  plates down to the level of the posterior longitudinal ligament, which was  then opened, and  then removed circumferentially along with the posterior  osteophytes with the Kerrison punch.  Once the decompression was completed  at all three levels, 8 mm fibular allografts were tapped into each level at  C5-6 and C6-7.  We then turned our attention to the anterior cervical  plating.  We used a 60 mm Atlantis Vision plate, and we were able to place  two 15 mm fixed-angle screws into the body of C7 and two variable-angle 15  mm screws into the bodies of C4, C5, and C6, inclusive.  These were then  locked into position with a locking mechanism.  We then irrigated with  copious amounts of bacitracin-containing saline solution, dried all bleeding  points with bipolar cautery.  Once meticulous hemostasis was achieved, we  closed the platysma with 3-0 Vicryl, closed the subcuticular tissue with 3-0  Vicryl, and closed the skin with Dermabond.  The drapes were removed.  The  patient was awakened from general anesthesia and transferred to the recovery  room in stable condition.  At the end of the procedure all sponge, needle,  and instrument counts were correct.                                               Tia Alert, MD    DSJ/MEDQ  D:  05/24/2003  T:  05/26/2003  Job:  [id number]

## 2010-07-28 ENCOUNTER — Encounter: Payer: Self-pay | Admitting: Internal Medicine

## 2010-08-01 ENCOUNTER — Encounter: Payer: Self-pay | Admitting: Internal Medicine

## 2010-08-06 ENCOUNTER — Encounter: Payer: Self-pay | Admitting: Internal Medicine

## 2010-10-31 ENCOUNTER — Encounter: Payer: Self-pay | Admitting: Internal Medicine

## 2010-10-31 ENCOUNTER — Ambulatory Visit (INDEPENDENT_AMBULATORY_CARE_PROVIDER_SITE_OTHER): Payer: Medicare HMO | Admitting: Internal Medicine

## 2010-10-31 ENCOUNTER — Other Ambulatory Visit (INDEPENDENT_AMBULATORY_CARE_PROVIDER_SITE_OTHER): Payer: Medicare HMO

## 2010-10-31 DIAGNOSIS — M67439 Ganglion, unspecified wrist: Secondary | ICD-10-CM

## 2010-10-31 DIAGNOSIS — E785 Hyperlipidemia, unspecified: Secondary | ICD-10-CM

## 2010-10-31 DIAGNOSIS — M6740 Ganglion, unspecified site: Secondary | ICD-10-CM

## 2010-10-31 DIAGNOSIS — E119 Type 2 diabetes mellitus without complications: Secondary | ICD-10-CM

## 2010-10-31 DIAGNOSIS — I10 Essential (primary) hypertension: Secondary | ICD-10-CM

## 2010-10-31 LAB — HEMOGLOBIN A1C: Hgb A1c MFr Bld: 6.7 % — ABNORMAL HIGH (ref 4.6–6.5)

## 2010-10-31 NOTE — Assessment & Plan Note (Signed)
On statin since 2009 - LDL at goal, check annually The current medical regimen is effective;  continue present plan and medications. 

## 2010-10-31 NOTE — Assessment & Plan Note (Signed)
Manual recheck 132/82 The current medical regimen is effective;  continue present plan and medications.  BP Readings from Last 3 Encounters:  10/31/10 150/82  07/02/10 118/70  03/26/10 122/72

## 2010-10-31 NOTE — Assessment & Plan Note (Signed)
The current medical regimen is effective;  continue present plan and medications.  Check a1c today  Lab Results  Component Value Date   HGBA1C 6.8* 07/02/2010

## 2010-10-31 NOTE — Patient Instructions (Signed)
It was good to see you today. Test(s) ordered today. Your results will be called to you after review (48-72hours after test completion). If any changes need to be made, you will be notified at that time. Medications reviewed, no changes at this time. Get the flu shot this fall and think about pneumonia shot as discussed we'll make referral to eye doctor and hand doctor. Our office will contact you regarding appointment(s) once made. Please schedule followup in 4-6 months for diabetes and cholesterol check, call sooner if problems.

## 2010-10-31 NOTE — Progress Notes (Signed)
  Subjective:    Patient ID: Colin Love, male    DOB: 12-10-1945, 65 y.o.   MRN: [id number]  HPI   here for follow up Would like cyst to be removed from L wrist -  Also reviewed chronic medical issues today:  DM2 - reports compliance with ongoing medical treatment and no changes in medication dose or frequency. denies adverse side effects related to current therapy. checks sugars  <1 x/d, no signs or symptoms hypoglycemia  HTN - reports compliance with ongoing medical treatment and no changes in medication dose or frequency. denies adverse side effects related to current therapy.  no edema, HA or CP  dyslipidemia - on statin tx since 2009 - reports compliance with ongoing medical treatment and no changes in medication dose or frequency. denies adverse side effects related to current therapy.  no GI or muscle aches  Past Medical History  Diagnosis Date  . COLONIC POLYPS, HX OF   . DIVERTICULITIS, HX OF   . DIABETES MELLITUS, TYPE II   . HYPERLIPIDEMIA   . HYPERTENSION      Review of Systems  Constitutional: Negative for unexpected weight change.  Respiratory: Negative for cough.   Cardiovascular: Negative for chest pain.  Neurological: Negative for headaches.       Objective:   Physical Exam BP 132/82  Pulse 93  Temp(Src) 98.7 F (37.1 C) (Oral)  Ht 5\' 7"  (1.702 m)  Wt 201 lb (91.173 kg)  BMI 31.48 kg/m2  SpO2 98% Wt Readings from Last 3 Encounters:  10/31/10 201 lb (91.173 kg)  07/02/10 204 lb (92.534 kg)  03/26/10 204 lb 6.4 oz (92.715 kg)   Constitutional: He is oriented to person, place, and time. He appears well-developed and well-nourished.  Cardiovascular: Normal rate, regular rhythm and normal heart sounds.   Pulmonary/Chest: Effort normal and breath sounds normal. No respiratory distress.  Musculoskeletal: 2.5 cm ganglion cyst on left wrist, flexor surface thenar side - nontender  Neurological: He is alert and oriented to person, place, and time. No  cranial nerve deficit.  Psychiatric: He has a normal mood and affect. His behavior is normal.    Lab Results  Component Value Date   CHOL 141 12/23/2009   TRIG 114.0 12/23/2009   HDL 42.40 12/23/2009   ALT 16 12/23/2009   AST 21 12/23/2009   NA 141 12/23/2009   K 3.9 12/23/2009   CL 101 12/23/2009   CREATININE 0.9 12/23/2009   BUN 12 12/23/2009   CO2 30 12/23/2009   HGBA1C 6.8* 07/02/2010       Assessment & Plan:   See problem list. Medications and labs reviewed today.  Ganglion cyst L wrist - refer to hand for mgmt of same

## 2011-01-28 ENCOUNTER — Other Ambulatory Visit: Payer: Self-pay | Admitting: Internal Medicine

## 2011-03-20 ENCOUNTER — Other Ambulatory Visit (INDEPENDENT_AMBULATORY_CARE_PROVIDER_SITE_OTHER): Payer: Medicare HMO

## 2011-03-20 ENCOUNTER — Encounter: Payer: Self-pay | Admitting: Internal Medicine

## 2011-03-20 ENCOUNTER — Ambulatory Visit (INDEPENDENT_AMBULATORY_CARE_PROVIDER_SITE_OTHER): Payer: Medicare HMO | Admitting: Internal Medicine

## 2011-03-20 DIAGNOSIS — F17200 Nicotine dependence, unspecified, uncomplicated: Secondary | ICD-10-CM

## 2011-03-20 DIAGNOSIS — Z23 Encounter for immunization: Secondary | ICD-10-CM

## 2011-03-20 DIAGNOSIS — E785 Hyperlipidemia, unspecified: Secondary | ICD-10-CM

## 2011-03-20 DIAGNOSIS — E119 Type 2 diabetes mellitus without complications: Secondary | ICD-10-CM

## 2011-03-20 DIAGNOSIS — I10 Essential (primary) hypertension: Secondary | ICD-10-CM

## 2011-03-20 DIAGNOSIS — Z720 Tobacco use: Secondary | ICD-10-CM

## 2011-03-20 LAB — LIPID PANEL
Cholesterol: 126 mg/dL (ref 0–200)
HDL: 47.4 mg/dL
LDL Cholesterol: 67 mg/dL (ref 0–99)
Total CHOL/HDL Ratio: 3
Triglycerides: 60 mg/dL (ref 0.0–149.0)
VLDL: 12 mg/dL (ref 0.0–40.0)

## 2011-03-20 LAB — HEMOGLOBIN A1C: Hgb A1c MFr Bld: 6.6 % — ABNORMAL HIGH (ref 4.6–6.5)

## 2011-03-20 NOTE — Patient Instructions (Signed)
It was good to see you today. Test(s) ordered today. Your results will be called to you after review (48-72hours after test completion). If any changes need to be made, you will be notified at that time. Medications reviewed, no changes at this time. Flu shot and pneumonia vaccine today Continue to think about giving up cigarettes! Use nicotine gum, nicotine patches or electronic cigarettes. If you're interested in medication to help you quit, please call  - let me know how I can help! Please schedule followup in 6 months for diabetes check, call sooner if problems.

## 2011-03-20 NOTE — Assessment & Plan Note (Signed)
The current medical regimen is effective;  continue present plan and medications.  BP Readings from Last 3 Encounters:  03/20/11 122/82  10/31/10 132/82  07/02/10 118/70

## 2011-03-20 NOTE — Assessment & Plan Note (Signed)
On statin since 2009 - LDL at goal, check annually The current medical regimen is effective;  continue present plan and medications. 

## 2011-03-20 NOTE — Assessment & Plan Note (Signed)
The current medical regimen is effective;  continue present plan and medications.  Check a1c today  Lab Results  Component Value Date   HGBA1C 6.7* 10/31/2010

## 2011-03-20 NOTE — Progress Notes (Signed)
  Subjective:    Patient ID: Colin Love, male    DOB: 05/23/1945, 66 y.o.   MRN: [id number]  HPI   here for follow up - reviewed chronic medical issues today:  DM2 - reports compliance with ongoing medical treatment and no changes in medication dose or frequency. denies adverse side effects related to current therapy. checks sugars  <1 x/d, no signs or symptoms hypoglycemia  HTN - reports compliance with ongoing medical treatment and no changes in medication dose or frequency. denies adverse side effects related to current therapy.  no edema, HA or CP  dyslipidemia - on statin tx since 2009 - reports compliance with ongoing medical treatment and no changes in medication dose or frequency. denies adverse side effects related to current therapy.  no GI or muscle aches  Past Medical History  Diagnosis Date  . COLONIC POLYPS, HX OF   . DIVERTICULITIS, HX OF   . DIABETES MELLITUS, TYPE II   . HYPERLIPIDEMIA   . HYPERTENSION      Review of Systems  Constitutional: Negative for unexpected weight change.  Respiratory: Negative for cough.   Cardiovascular: Negative for chest pain.  Neurological: Negative for headaches.       Objective:   Physical Exam  BP 122/82  Pulse 88  Temp(Src) 98.9 F (37.2 C) (Oral)  Wt 205 lb 9.6 oz (93.26 kg)  SpO2 96% Wt Readings from Last 3 Encounters:  03/20/11 205 lb 9.6 oz (93.26 kg)  10/31/10 201 lb (91.173 kg)  07/02/10 204 lb (92.534 kg)   Constitutional: He is appears well-developed and well-nourished.  Cardiovascular: Normal rate, regular rhythm and normal heart sounds.   Pulmonary/Chest: Effort normal and breath sounds normal. No respiratory distress.   Neurological: He is alert and oriented to person, place, and time. No cranial nerve deficit.  Psychiatric: He has a normal mood and affect. His behavior is normal.    Lab Results  Component Value Date   CHOL 141 12/23/2009   TRIG 114.0 12/23/2009   HDL 42.40 12/23/2009   ALT 16  12/23/2009   AST 21 12/23/2009   NA 141 12/23/2009   K 3.9 12/23/2009   CL 101 12/23/2009   CREATININE 0.9 12/23/2009   BUN 12 12/23/2009   CO2 30 12/23/2009   HGBA1C 6.7* 10/31/2010       Assessment & Plan:   See problem list. Medications and labs reviewed today.  Tobacco abuse - 5 minutes today spent counseling patient on unhealthy effects of continued tobacco abuse and encouragement of cessation including medical options available to help the patient quit smoking.

## 2011-06-18 ENCOUNTER — Other Ambulatory Visit: Payer: Self-pay | Admitting: Internal Medicine

## 2011-08-10 ENCOUNTER — Other Ambulatory Visit: Payer: Self-pay | Admitting: Internal Medicine

## 2011-08-15 ENCOUNTER — Other Ambulatory Visit: Payer: Self-pay | Admitting: Internal Medicine

## 2011-09-17 ENCOUNTER — Encounter: Payer: Self-pay | Admitting: Internal Medicine

## 2011-09-17 ENCOUNTER — Ambulatory Visit (INDEPENDENT_AMBULATORY_CARE_PROVIDER_SITE_OTHER): Payer: Medicare HMO | Admitting: Internal Medicine

## 2011-09-17 ENCOUNTER — Other Ambulatory Visit (INDEPENDENT_AMBULATORY_CARE_PROVIDER_SITE_OTHER): Payer: Medicare HMO

## 2011-09-17 VITALS — BP 130/70 | HR 74 | Temp 98.4°F | Ht 67.0 in | Wt 199.4 lb

## 2011-09-17 DIAGNOSIS — F17210 Nicotine dependence, cigarettes, uncomplicated: Secondary | ICD-10-CM | POA: Insufficient documentation

## 2011-09-17 DIAGNOSIS — I10 Essential (primary) hypertension: Secondary | ICD-10-CM

## 2011-09-17 DIAGNOSIS — E119 Type 2 diabetes mellitus without complications: Secondary | ICD-10-CM

## 2011-09-17 DIAGNOSIS — E785 Hyperlipidemia, unspecified: Secondary | ICD-10-CM

## 2011-09-17 DIAGNOSIS — F17200 Nicotine dependence, unspecified, uncomplicated: Secondary | ICD-10-CM

## 2011-09-17 DIAGNOSIS — Z720 Tobacco use: Secondary | ICD-10-CM | POA: Insufficient documentation

## 2011-09-17 LAB — HEMOGLOBIN A1C: Hgb A1c MFr Bld: 6.8 % — ABNORMAL HIGH (ref 4.6–6.5)

## 2011-09-17 NOTE — Progress Notes (Signed)
  Subjective:    Patient ID: Colin Love, male    DOB: 11/02/1945, 66 y.o.   MRN: [id number]  HPI  here for follow up - reviewed chronic medical issues today:  DM2 - reports compliance with ongoing medical treatment and no changes in medication dose or frequency. denies adverse side effects related to current therapy. checks sugars  <1 x/d, no signs or symptoms hypoglycemia  HTN - reports compliance with ongoing medical treatment and no changes in medication dose or frequency. denies adverse side effects related to current therapy.  no edema, headache, or chest pain   dyslipidemia - on statin tx since 2009 - reports compliance with ongoing medical treatment and no changes in medication dose or frequency. denies adverse side effects related to current therapy.  no GI or muscle aches  Past Medical History  Diagnosis Date  . COLONIC POLYPS, HX OF   . DIVERTICULITIS, HX OF   . DIABETES MELLITUS, TYPE II   . HYPERLIPIDEMIA   . HYPERTENSION      Review of Systems  Constitutional: Negative for unexpected weight change.  Respiratory: Negative for cough and shortness of breath.   Cardiovascular: Negative for chest pain, palpitations and leg swelling.  Gastrointestinal: Negative for abdominal pain.  Neurological: Negative for dizziness, syncope and headaches.       Objective:   Physical Exam  BP 130/70  Pulse 74  Temp 98.4 F (36.9 C) (Oral)  Ht 5\' 7"  (1.702 m)  Wt 199 lb 6.4 oz (90.447 kg)  BMI 31.23 kg/m2  SpO2 96% Wt Readings from Last 3 Encounters:  09/17/11 199 lb 6.4 oz (90.447 kg)  03/20/11 205 lb 9.6 oz (93.26 kg)  10/31/10 201 lb (91.173 kg)   Constitutional: He is appears well-developed and well-nourished.  Cardiovascular: Normal rate, regular rhythm and normal heart sounds.  no BLE edema Pulmonary/Chest: Effort normal and breath sounds normal. No respiratory distress.   Neurological: He is alert and oriented to person, place, and time. No cranial nerve deficit.   Psychiatric: He has a normal mood and affect. His behavior is normal.    Lab Results  Component Value Date   CHOL 126 03/20/2011   TRIG 60.0 03/20/2011   HDL 47.40 03/20/2011   ALT 16 12/23/2009   AST 21 12/23/2009   NA 141 12/23/2009   K 3.9 12/23/2009   CL 101 12/23/2009   CREATININE 0.9 12/23/2009   BUN 12 12/23/2009   CO2 30 12/23/2009   HGBA1C 6.6* 03/20/2011       Assessment & Plan:   See problem list. Medications and labs reviewed today.

## 2011-09-17 NOTE — Assessment & Plan Note (Signed)
5 minutes today spent counseling patient on unhealthy effects of continued tobacco abuse and encouragement of cessation including medical options available to help the patient quit smoking. Recently purchased Nicorette gum 08/2011 - will consider chantix if nicorette ineffective -

## 2011-09-17 NOTE — Assessment & Plan Note (Signed)
On statin since 2009 - LDL at goal, check annually The current medical regimen is effective;  continue present plan and medications. 

## 2011-09-17 NOTE — Assessment & Plan Note (Signed)
The current medical regimen is effective;  continue present plan and medications.  BP Readings from Last 3 Encounters:  09/17/11 130/70  03/20/11 122/82  10/31/10 132/82

## 2011-09-17 NOTE — Assessment & Plan Note (Signed)
The current medical regimen is effective;  continue present plan and medications.  Check a1c today  Lab Results  Component Value Date   HGBA1C 6.6* 03/20/2011

## 2011-09-17 NOTE — Patient Instructions (Signed)
It was good to see you today. Test(s) ordered today. Your results will be called to you after review (48-72hours after test completion). If any changes need to be made, you will be notified at that time. Medications reviewed, no changes at this time. Continue to think about giving up cigarettes! Use nicotine gum, nicotine patches or electronic cigarettes. If you're interested in medication to help you quit, please call  - let me know how I can help! Please schedule followup in 6 months for diabetes check, call sooner if problems.

## 2011-11-05 ENCOUNTER — Ambulatory Visit (INDEPENDENT_AMBULATORY_CARE_PROVIDER_SITE_OTHER): Payer: Medicare HMO | Admitting: Internal Medicine

## 2011-11-05 ENCOUNTER — Encounter: Payer: Self-pay | Admitting: Internal Medicine

## 2011-11-05 VITALS — BP 130/60 | HR 95 | Temp 98.6°F | Ht 67.0 in | Wt 203.1 lb

## 2011-11-05 DIAGNOSIS — T6391XA Toxic effect of contact with unspecified venomous animal, accidental (unintentional), initial encounter: Secondary | ICD-10-CM

## 2011-11-05 DIAGNOSIS — E119 Type 2 diabetes mellitus without complications: Secondary | ICD-10-CM

## 2011-11-05 DIAGNOSIS — T63481A Toxic effect of venom of other arthropod, accidental (unintentional), initial encounter: Secondary | ICD-10-CM

## 2011-11-05 MED ORDER — METHYLPREDNISOLONE ACETATE 80 MG/ML IJ SUSP
80.0000 mg | Freq: Once | INTRAMUSCULAR | Status: AC
  Administered 2011-11-05: 80 mg via INTRAMUSCULAR

## 2011-11-05 MED ORDER — DIPHENHYDRAMINE HCL 25 MG PO TABS: 25.0000 mg | ORAL_TABLET | Freq: Four times a day (QID) | ORAL | Status: DC | PRN

## 2011-11-05 NOTE — Patient Instructions (Signed)
It was good to see you today. Medrol given for allergic reaction/swelling today Use Benadryl every 4-6 hours as needed for itch or swelling Watch your sugars and call if over 200 in next few days - steroids will increase your sugars more than usual for next few days! Allergic Reaction, Mild to Moderate Allergies may happen from anything your body is sensitive to. This may be food, medications, pollens, chemicals, and nearly anything around you in everyday life that produces allergens. An allergen is anything that causes an allergy producing substance. Allergens cause your body to release allergic antibodies. Through a chain of events, they cause a release of histamine into the blood stream. Histamines are meant to protect you, but they also cause your discomfort. This is why antihistamines are often used for allergies. Heredity is often a factor in causing allergic reactions. This means you may have some of the same allergies as your parents. Allergies happen in all age groups. You may have some idea of what caused your reaction. There are many allergens around [place]. It may be difficult to know what caused your reaction. If this is a first time event, it may never happen again. Allergies cannot be cured but can be controlled with medications. SYMPTOMS   You may get some or all of the following problems from allergies.  Swelling and itching in and around the mouth.   Tearing, itchy eyes.   Nasal congestion and runny nose.   Sneezing and coughing.   An itchy red rash or hives.   Vomiting or diarrhea.   Difficulty breathing.  Seasonal allergies occur in all age groups. They are seasonal because they usually occur during the same season every year. They may be a reaction to molds, grass pollens, or tree pollens. Other causes of allergies are house dust mite allergens, pet dander and mold spores. These are just a common few of the thousands of allergens around [place]. All of the symptoms listed above  happen when you come in contact with pollens and other allergens. Seasonal allergies are usually not life threatening. They are generally more of a nuisance that can often be handled using medications. Hay fever is a combination of all or some of the above listed allergy problems. It may often be treated with simple over-the-counter medications such as diphenhydramine. Take medication as directed. Check with your caregiver or package insert for child dosages. TREATMENT AND HOME CARE INSTRUCTIONS If hives or rash are present:  Take medications as directed.   You may use an over-the-counter antihistamine (diphenhydramine) for hives and itching as needed. Do not drive or drink alcohol until medications used to treat the reaction have worn off. Antihistamines tend to make people sleepy.   Apply cold cloths (compresses) to the skin or take baths in cool water. This will help itching. Avoid hot baths or showers. Heat will make a rash and itching worse.   If your allergies persist and become more severe, and over the counter medications are not effective, there are many new medications your caretaker can prescribe. Immunotherapy or desensitizing injections can be used if all else fails. Follow up with your caregiver if problems continue.  SEEK MEDICAL CARE IF:    Your allergies are becoming progressively more troublesome.   You suspect a food allergy. Symptoms generally happen within 30 minutes of eating a food.   Your symptoms have not gone away within 2 days or are getting worse.   You develop new symptoms.   You want to retest  yourself or your child with a food or drink you think causes an allergic reaction. Never test yourself or your child of a suspected allergy without being under the watchful eye of your caregivers. A second exposure to an allergen may be life-threatening.  SEEK IMMEDIATE MEDICAL CARE IF:  You develop difficulty breathing or wheezing, or have a tight feeling in your chest or  throat.   You develop a swollen mouth, hives, swelling, or itching all over your body.  A severe reaction with any of the above problems should be considered life-threatening. If you suddenly develop difficulty breathing call for local emergency medical help. THIS IS AN EMERGENCY. MAKE SURE YOU:    Understand these instructions.   Will watch your condition.   Will get help right away if you are not doing well or get worse.  Document Released: 12/21/2006 Document Revised: 02/12/2011 Document Reviewed: 12/21/2006 [facility] Patient Information 2012 [place], [place].

## 2011-11-05 NOTE — Assessment & Plan Note (Signed)
The current medical regimen is effective;  continue present plan and medications.  Advised to watch sugars and call if over 200 in next few days due to steroids effect   Lab Results  Component Value Date   HGBA1C 6.8* 09/17/2011

## 2011-11-05 NOTE — Progress Notes (Signed)
  Subjective:    Patient ID: Colin Love, male    DOB: 05/02/1945, 66 y.o.   MRN: [id number]  HPI  here for face/lip swelling Onset <6 hours ago - "normal" at 6 AM when got up to BR 730AM noticed itch on right side of mouth with swelling Progressed across upper lip for 2 hours, made appointment - now improving, but not yet normalized Denies tongue swelling, difficulty swallowing/breathing or talking No new meds or foods - actually off ASA last 72h due to upcoming dental work - planning to pull all upper teeth No fever, or rash - no swollen joints, no headache    Past Medical History  Diagnosis Date  . COLONIC POLYPS, HX OF   . DIVERTICULITIS, HX OF   . DIABETES MELLITUS, TYPE II   . HYPERLIPIDEMIA   . HYPERTENSION      Review of Systems  Constitutional: Negative for unexpected weight change.  Respiratory: Negative for cough and shortness of breath.   Cardiovascular: Negative for chest pain, palpitations and leg swelling.  Gastrointestinal: Negative for abdominal pain.  Neurological: Negative for dizziness, syncope and headaches.       Objective:   Physical Exam  BP 130/60  Pulse 95  Temp 98.6 F (37 C) (Oral)  Ht 5\' 7"  (1.702 m)  Wt 203 lb 1.9 oz (92.135 kg)  BMI 31.81 kg/m2  SpO2 97% Wt Readings from Last 3 Encounters:  11/05/11 203 lb 1.9 oz (92.135 kg)  09/17/11 199 lb 6.4 oz (90.447 kg)  03/20/11 205 lb 9.6 oz (93.26 kg)   Constitutional: He is appears well-developed and well-nourished. localized edema R>L upper lip - HENT: non tender sinus- no tongue swelling or OP swelling - no dysarthria or stridor - poor dentition with broken teeth but no abscess Cardiovascular: Normal rate, regular rhythm and normal heart sounds.  no BLE edema Pulmonary/Chest: Effort normal and breath sounds normal. No respiratory distress.   Skin - mild erythema localized to R upper lip - no blister or hive - mild nodular Neurological: He is alert and oriented to person, place, and  time. No cranial nerve deficit.  Psychiatric: He has a normal mood and affect. His behavior is normal.    Lab Results  Component Value Date   CHOL 126 03/20/2011   TRIG 60.0 03/20/2011   HDL 47.40 03/20/2011   ALT 16 12/23/2009   AST 21 12/23/2009   NA 141 12/23/2009   K 3.9 12/23/2009   CL 101 12/23/2009   CREATININE 0.9 12/23/2009   BUN 12 12/23/2009   CO2 30 12/23/2009   HGBA1C 6.8* 09/17/2011       Assessment & Plan:   Allergic reaction - ?insect sting - no evidence for angioedema - spontaneously improving since onset per pt over past 90 minutes IM medrol and benadryl today The patient is reassured that these symptoms do not appear to represent a serious or threatening condition. However, warning signs reviewed - to monitor for change and go to [facility] if worse or recurrent

## 2012-02-09 ENCOUNTER — Other Ambulatory Visit: Payer: Self-pay | Admitting: Internal Medicine

## 2012-02-16 ENCOUNTER — Other Ambulatory Visit: Payer: Self-pay | Admitting: Internal Medicine

## 2012-03-17 ENCOUNTER — Ambulatory Visit (INDEPENDENT_AMBULATORY_CARE_PROVIDER_SITE_OTHER): Payer: Medicare HMO | Admitting: Internal Medicine

## 2012-03-17 ENCOUNTER — Other Ambulatory Visit (INDEPENDENT_AMBULATORY_CARE_PROVIDER_SITE_OTHER): Payer: Medicare HMO

## 2012-03-17 ENCOUNTER — Encounter: Payer: Self-pay | Admitting: Internal Medicine

## 2012-03-17 VITALS — BP 118/72 | HR 80 | Temp 98.3°F | Ht 67.0 in | Wt 201.0 lb

## 2012-03-17 DIAGNOSIS — E119 Type 2 diabetes mellitus without complications: Secondary | ICD-10-CM

## 2012-03-17 DIAGNOSIS — Z79899 Other long term (current) drug therapy: Secondary | ICD-10-CM

## 2012-03-17 DIAGNOSIS — E785 Hyperlipidemia, unspecified: Secondary | ICD-10-CM

## 2012-03-17 DIAGNOSIS — I10 Essential (primary) hypertension: Secondary | ICD-10-CM

## 2012-03-17 DIAGNOSIS — F17200 Nicotine dependence, unspecified, uncomplicated: Secondary | ICD-10-CM

## 2012-03-17 DIAGNOSIS — Z23 Encounter for immunization: Secondary | ICD-10-CM

## 2012-03-17 LAB — HEPATIC FUNCTION PANEL
ALT: 17 U/L (ref 0–53)
AST: 22 U/L (ref 0–37)
Albumin: 4 g/dL (ref 3.5–5.2)
Alkaline Phosphatase: 58 U/L (ref 39–117)
Bilirubin, Direct: 0.2 mg/dL (ref 0.0–0.3)
Total Bilirubin: 1.1 mg/dL (ref 0.3–1.2)
Total Protein: 7.2 g/dL (ref 6.0–8.3)

## 2012-03-17 LAB — BASIC METABOLIC PANEL WITH GFR
BUN: 11 mg/dL (ref 6–23)
CO2: 29 meq/L (ref 19–32)
Calcium: 9.8 mg/dL (ref 8.4–10.5)
Chloride: 99 meq/L (ref 96–112)
Creatinine, Ser: 0.9 mg/dL (ref 0.4–1.5)
GFR: 109.56 mL/min
Glucose, Bld: 121 mg/dL — ABNORMAL HIGH (ref 70–99)
Potassium: 3.4 meq/L — ABNORMAL LOW (ref 3.5–5.1)
Sodium: 136 meq/L (ref 135–145)

## 2012-03-17 LAB — LIPID PANEL
Cholesterol: 123 mg/dL (ref 0–200)
HDL: 41.5 mg/dL
LDL Cholesterol: 58 mg/dL (ref 0–99)
Total CHOL/HDL Ratio: 3
Triglycerides: 120 mg/dL (ref 0.0–149.0)
VLDL: 24 mg/dL (ref 0.0–40.0)

## 2012-03-17 LAB — HEMOGLOBIN A1C: Hgb A1c MFr Bld: 6.6 % — ABNORMAL HIGH (ref 4.6–6.5)

## 2012-03-17 NOTE — Assessment & Plan Note (Signed)
5 minutes today spent counseling patient on unhealthy effects of continued tobacco abuse and encouragement of cessation including medical options available to help the patient quit smoking. tried Nicorette gum 08/2011 - will consider chantix if "cold [place]" plans for New Year 2014 ineffective -

## 2012-03-17 NOTE — Assessment & Plan Note (Signed)
The current medical regimen is effective;  continue present plan and medications.  BP Readings from Last 3 Encounters:  03/17/12 118/72  11/05/11 130/60  09/17/11 130/70

## 2012-03-17 NOTE — Assessment & Plan Note (Signed)
On statin since 2009 - LDL at goal, check annually The current medical regimen is effective;  continue present plan and medications. 

## 2012-03-17 NOTE — Patient Instructions (Signed)
It was good to see you today. Test(s) ordered today. Your results will be released to MyChart (or called to you) after review, usually within 72hours after test completion. If any changes need to be made, you will be notified at that same time. Flu shot given today Medications reviewed, no changes at this time. we'll make referral to eye specialist for diabetes mellitus check . Our office will contact you regarding appointment(s) once made. Continue to think about giving up cigarettes! Use nicotine gum, nicotine patches or electronic cigarettes. If you're interested in medication to help you quit, please call  - let me know how I can help! Please schedule followup in 6 months for diabetes check, call sooner if problems. Smoking Cessation Quitting smoking is important to your health and has many advantages. However, it is not always easy to quit since nicotine is a very addictive drug. Often times, people try 3 times or more before being able to quit. This document explains the best ways for you to prepare to quit smoking. Quitting takes hard work and a lot of effort, but you can do it. ADVANTAGES OF QUITTING SMOKING  You will live longer, feel better, and live better.   Your body will feel the impact of quitting smoking almost immediately.   Within 20 minutes, blood pressure decreases. Your pulse returns to its normal level.   After 8 hours, carbon monoxide levels in the blood return to normal. Your oxygen level increases.   After 24 hours, the chance of having a heart attack starts to decrease. Your breath, hair, and body stop smelling like smoke.   After 48 hours, damaged nerve endings begin to recover. Your sense of taste and smell improve.   After 72 hours, the body is virtually free of nicotine. Your bronchial tubes relax and breathing becomes easier.   After 2 to 12 weeks, lungs can hold more air. Exercise becomes easier and circulation improves.   The risk of having a heart attack,  stroke, cancer, or lung disease is greatly reduced.   After 1 year, the risk of coronary heart disease is cut in half.   After 5 years, the risk of stroke falls to the same as a nonsmoker.   After 10 years, the risk of lung cancer is cut in half and the risk of other cancers decreases significantly.   After 15 years, the risk of coronary heart disease drops, usually to the level of a nonsmoker.   If you are pregnant, quitting smoking will improve your chances of having a healthy baby.   The people you live with, especially any children, will be healthier.   You will have extra money to spend on things other than cigarettes.  QUESTIONS TO THINK ABOUT BEFORE ATTEMPTING TO QUIT You may want to talk about your answers with your caregiver.  Why do you want to quit?   If you tried to quit in the past, what helped and what did not?   What will be the most difficult situations for you after you quit? How will you plan to handle them?   Who can help you through the tough times? Your family? Friends? A caregiver?   What pleasures do you get from smoking? What ways can you still get pleasure if you quit?  Here are some questions to ask your caregiver:  How can you help me to be successful at quitting?   What medicine do you think would be best for me and how should I take  it?   What should I do if I need more help?   What is smoking withdrawal like? How can I get information on withdrawal?  GET READY  Set a quit date.   Change your environment by getting rid of all cigarettes, ashtrays, matches, and lighters in your home, car, or work. Do not let people smoke in your home.   Review your past attempts to quit. Think about what worked and what did not.  GET SUPPORT AND ENCOURAGEMENT You have a better chance of being successful if you have help. You can get support in many ways.  Tell your family, friends, and co-workers that you are going to quit and need their support. Ask them not  to smoke around you.   Get individual, group, or telephone counseling and support. Programs are available at [employer] and health centers. Call your local health department for information about programs in your area.   Spiritual beliefs and practices may help some smokers quit.   Download a "quit meter" on your computer to keep track of quit statistics, such as how long you have gone without smoking, cigarettes not smoked, and money saved.   Get a self-help book about quitting smoking and staying off of tobacco.  LEARN NEW SKILLS AND BEHAVIORS  Distract yourself from urges to smoke. Talk to someone, go for a walk, or occupy your time with a task.   Change your normal routine. Take a different route to work. Drink tea instead of coffee. Eat breakfast in a different place.   Reduce your stress. Take a hot bath, exercise, or read a book.   Plan something enjoyable to do every day. Reward yourself for not smoking.   Explore interactive web-based programs that specialize in helping you quit.  GET MEDICINE AND USE IT CORRECTLY Medicines can help you stop smoking and decrease the urge to smoke. Combining medicine with the above behavioral methods and support can greatly increase your chances of successfully quitting smoking.  Nicotine replacement therapy helps deliver nicotine to your body without the negative effects and risks of smoking. Nicotine replacement therapy includes nicotine gum, lozenges, inhalers, nasal sprays, and skin patches. Some may be available over-the-counter and others require a prescription.   Antidepressant medicine helps people abstain from smoking, but how this works is unknown. This medicine is available by prescription.   Nicotinic receptor partial agonist medicine simulates the effect of nicotine in your brain. This medicine is available by prescription.  Ask your caregiver for advice about which medicines to use and how to use them based on your health  history. Your caregiver will tell you what side effects to look out for if you choose to be on a medicine or therapy. Carefully read the information on the package. Do not use any other product containing nicotine while using a nicotine replacement product.   RELAPSE OR DIFFICULT SITUATIONS Most relapses occur within the first 3 months after quitting. Do not be discouraged if you start smoking again. Remember, most people try several times before finally quitting. You may have symptoms of withdrawal because your body is used to nicotine. You may crave cigarettes, be irritable, feel very hungry, cough often, get headaches, or have difficulty concentrating. The withdrawal symptoms are only temporary. They are strongest when you first quit, but they will go away within 10 14 days. To reduce the chances of relapse, try to:  Avoid drinking alcohol. Drinking lowers your chances of successfully quitting.   Reduce the amount of  caffeine you consume. Once you quit smoking, the amount of caffeine in your body increases and can give you symptoms, such as a rapid heartbeat, sweating, and anxiety.   Avoid smokers because they can make you want to smoke.   Do not let weight gain distract you. Many smokers will gain weight when they quit, usually less than 10 pounds. Eat a healthy diet and stay active. You can always lose the weight gained after you quit.   Find ways to improve your mood other than smoking.  FOR MORE INFORMATION   www.smokefree.gov   Document Released: 02/17/2001 Document Revised: 08/25/2011 Document Reviewed: 06/04/2011 [facility] Patient Information 2013 [place], [place].

## 2012-03-17 NOTE — Assessment & Plan Note (Signed)
The current medical regimen is effective;  continue present plan and medications.  On statin, ACEI and ASA Check a1c q6 mo, titrate as needed  Lab Results  Component Value Date   HGBA1C 6.8* 09/17/2011

## 2012-03-17 NOTE — Progress Notes (Signed)
  Subjective:    Patient ID: Colin Love, male    DOB: [birth year], 67 y.o.   MRN: [id number]  HPI  here for follow up - reviewed chronic medical issues today:  DM2 - reports compliance with ongoing medical treatment and no changes in medication dose or frequency. denies adverse side effects related to current therapy. checks sugars  <1 x/d, no signs or symptoms hypoglycemia  HTN - reports compliance with ongoing medical treatment and no changes in medication dose or frequency. denies adverse side effects related to current therapy.  no edema, headache, or chest pain   dyslipidemia - on statin tx since 2009 - reports compliance with ongoing medical treatment and no changes in medication dose or frequency. denies adverse side effects related to current therapy.  no GI or muscle aches  Past Medical History  Diagnosis Date  . COLONIC POLYPS, HX OF   . DIVERTICULITIS, HX OF   . DIABETES MELLITUS, TYPE II   . HYPERLIPIDEMIA   . HYPERTENSION      Review of Systems  Constitutional: Negative for unexpected weight change.  Respiratory: Negative for cough and shortness of breath.   Cardiovascular: Negative for chest pain, palpitations and leg swelling.  Gastrointestinal: Negative for abdominal pain.  Neurological: Negative for dizziness, syncope and headaches.       Objective:   Physical Exam  BP 118/72  Pulse 80  Temp 98.3 F (36.8 C) (Oral)  Ht 5\' 7"  (1.702 m)  Wt 201 lb (91.173 kg)  BMI 31.48 kg/m2  SpO2 97% Wt Readings from Last 3 Encounters:  03/17/12 201 lb (91.173 kg)  11/05/11 203 lb 1.9 oz (92.135 kg)  09/17/11 199 lb 6.4 oz (90.447 kg)   Constitutional: He is appears well-developed and well-nourished.  Cardiovascular: Normal rate, regular rhythm and normal heart sounds.  no BLE edema Pulmonary/Chest: Effort normal and breath sounds normal. No respiratory distress.   Neurological: He is alert and oriented to person, place, and time. No cranial nerve deficit.    Psychiatric: He has a normal mood and affect. His behavior is normal.    Lab Results  Component Value Date   CHOL 126 03/20/2011   TRIG 60.0 03/20/2011   HDL 47.40 03/20/2011   ALT 16 12/23/2009   AST 21 12/23/2009   NA 141 12/23/2009   K 3.9 12/23/2009   CL 101 12/23/2009   CREATININE 0.9 12/23/2009   BUN 12 12/23/2009   CO2 30 12/23/2009   HGBA1C 6.8* 09/17/2011       Assessment & Plan:   See problem list. Medications and labs reviewed today.

## 2012-03-18 NOTE — Progress Notes (Signed)
Subjective:    Patient ID: Colin Love, male    DOB: [birth year], 67 y.o.   MRN: [id number]  HPI Pt presents for 6 months follow-up of chronic health issues (HTN, HLD, and DM).  Pt reports no current complaints or health issues. HTN- Pt reports home monitoring of BP with good control and compliance with medications.  Pt denies chest pain, shortness of breath, syncope, headache and swelling. HLD- Pt reports attempting healthier diet and compliance with medications. DM- Pt reports home monitoring of blood sugars shows good control checking 1x/day.  Self reports previous HgbA1C of 6.8.  Pt reports compliance with medications and denies hypoglycemic events.  Pt denies blurring or changes in vision, neuropathies.  Pt denies regular follow-up with opthamologist or podiatrist. Diverticulosis-  Pt denies history of flares since last visit.  Pt reports normal bowel movements with no obvious blood or melena.    Past Medical History  Diagnosis Date  . COLONIC POLYPS, HX OF   . DIVERTICULITIS, HX OF   . DIABETES MELLITUS, TYPE II   . HYPERLIPIDEMIA   . HYPERTENSION        Review of Systems  Constitutional: Negative for fatigue and unexpected weight change.  HENT: Negative for hearing loss.   Musculoskeletal: Negative for arthralgias.  Also see above HPI.     Objective:   Physical Exam  Constitutional: He is oriented to person, place, and time. He appears well-developed and well-nourished. He is active. No distress.  HENT:  Head: Normocephalic.  Right Ear: Tympanic membrane normal.  Left Ear: Tympanic membrane normal.  Nose: No mucosal edema.  Mouth/Throat: Oropharynx is clear and moist and mucous membranes are normal.  Eyes: Conjunctivae normal are normal. Pupils are equal, round, and reactive to light.  Neck: Neck supple. No JVD present. Carotid bruit is not present. No mass and no thyromegaly present.  Cardiovascular: Normal rate, regular rhythm, normal heart sounds and intact  distal pulses.  Exam reveals no gallop and no friction rub.   No murmur heard. Pulmonary/Chest: Effort normal and breath sounds normal. No respiratory distress.  Abdominal: Soft. Bowel sounds are normal.  Neurological: He is alert and oriented to person, place, and time.  Skin: Skin is warm, dry and intact.  Psychiatric: He has a normal mood and affect. His speech is not delayed and not tangential. Cognition and memory are normal.  Diabetic foot exam completed- See [facility] flowsheet.  BP 118/72  Pulse 80  Temp 98.3 F (36.8 C) (Oral)  Ht 5\' 7"  (1.702 m)  Wt 201 lb (91.173 kg)  BMI 31.48 kg/m2  SpO2 97%  Lab Results  Component Value Date   GLUCOSE 121* 03/17/2012   CHOL 123 03/17/2012   TRIG 120.0 03/17/2012   HDL 41.50 03/17/2012   LDLCALC 58 03/17/2012   ALT 17 03/17/2012   AST 22 03/17/2012   NA 136 03/17/2012   K 3.4* 03/17/2012   CL 99 03/17/2012   CREATININE 0.9 03/17/2012   BUN 11 03/17/2012   CO2 29 03/17/2012   HGBA1C 6.6* 03/17/2012       Assessment & Plan:   Impression is of general good health.  No refills needed.  No changes in current medication regimen.  HTN-  Well controlled will follow-up in 6 months and as needed.  Continue home monitoring.  Will continue current medication regimen.  HLD-  Lab results show LDL in goal range.  Pt given diet education and will attempt to follow low fat diet.  No changes  in medication needed at this time.  Will follow lipids yearly.  DM-  Pt reports good control which is consistent with previous lab values.  Will follow-up in 6 months.  Pt will continue home monitoring of CBG's.  Will check Hgb A1C today.  No changes in medication needed.  Diverticulosis- Pt reports no issues at this time.  Will follow up in 6 months or as needed.  No medications currently prescribed or required.   I have personally reviewed this case with PA student. I also personally examined this patient. I agree with history and findings as documented above. I reviewed, discussed  and approve of the assessment and plan as listed above. Rene Paci, MD

## 2012-04-08 LAB — HM DIABETES EYE EXAM

## 2012-04-11 ENCOUNTER — Encounter: Payer: Self-pay | Admitting: Internal Medicine

## 2012-05-08 ENCOUNTER — Other Ambulatory Visit: Payer: Self-pay | Admitting: Internal Medicine

## 2012-08-18 ENCOUNTER — Other Ambulatory Visit: Payer: Self-pay | Admitting: Internal Medicine

## 2012-09-14 ENCOUNTER — Ambulatory Visit: Payer: Medicare HMO | Admitting: Internal Medicine

## 2012-09-22 ENCOUNTER — Other Ambulatory Visit (INDEPENDENT_AMBULATORY_CARE_PROVIDER_SITE_OTHER): Payer: Medicare HMO

## 2012-09-22 ENCOUNTER — Ambulatory Visit (INDEPENDENT_AMBULATORY_CARE_PROVIDER_SITE_OTHER): Payer: Medicare HMO | Admitting: Internal Medicine

## 2012-09-22 ENCOUNTER — Encounter: Payer: Self-pay | Admitting: Internal Medicine

## 2012-09-22 VITALS — BP 142/84 | HR 72 | Temp 98.5°F | Wt 196.4 lb

## 2012-09-22 DIAGNOSIS — E119 Type 2 diabetes mellitus without complications: Secondary | ICD-10-CM

## 2012-09-22 DIAGNOSIS — F17200 Nicotine dependence, unspecified, uncomplicated: Secondary | ICD-10-CM

## 2012-09-22 DIAGNOSIS — E785 Hyperlipidemia, unspecified: Secondary | ICD-10-CM

## 2012-09-22 DIAGNOSIS — I10 Essential (primary) hypertension: Secondary | ICD-10-CM

## 2012-09-22 LAB — MICROALBUMIN / CREATININE URINE RATIO: Microalb Creat Ratio: 31.9 mg/g — ABNORMAL HIGH (ref 0.0–30.0)

## 2012-09-22 LAB — HEMOGLOBIN A1C: Hgb A1c MFr Bld: 6.5 % (ref 4.6–6.5)

## 2012-09-22 MED ORDER — BUPROPION HCL ER (SMOKING DET) 150 MG PO TB12: 150.0000 mg | ORAL_TABLET | Freq: Two times a day (BID) | ORAL | Status: DC

## 2012-09-22 NOTE — Progress Notes (Signed)
  Subjective:    Patient ID: Colin Love, male    DOB: 1945/04/11, 67 y.o.   MRN: [id number]  HPI  here for follow up - reviewed chronic medical issues today:  DM2 - reports compliance with ongoing medical treatment and no changes in medication dose or frequency. denies adverse side effects related to current therapy. checks sugars  <1 x/d, no signs or symptoms hypoglycemia  HTN - reports compliance with ongoing medical treatment and no changes in medication dose or frequency. denies adverse side effects related to current therapy.  no edema, headache, or chest pain   dyslipidemia - on statin tx since 2009 - reports compliance with ongoing medical treatment and no changes in medication dose or frequency. denies adverse side effects related to current therapy.  no GI or muscle aches  Past Medical History  Diagnosis Date  . COLONIC POLYPS, HX OF   . DIVERTICULITIS, HX OF   . DIABETES MELLITUS, TYPE II   . HYPERLIPIDEMIA   . HYPERTENSION      Review of Systems  Constitutional: Negative for unexpected weight change.  Respiratory: Negative for cough and shortness of breath.   Cardiovascular: Negative for chest pain, palpitations and leg swelling.  Gastrointestinal: Negative for abdominal pain.  Neurological: Negative for dizziness, syncope and headaches.       Objective:   Physical Exam  BP 142/84  Pulse 72  Temp(Src) 98.5 F (36.9 C) (Oral)  Wt 196 lb 6.4 oz (89.086 kg)  BMI 30.75 kg/m2  SpO2 98% Wt Readings from Last 3 Encounters:  09/22/12 196 lb 6.4 oz (89.086 kg)  03/17/12 201 lb (91.173 kg)  11/05/11 203 lb 1.9 oz (92.135 kg)   Constitutional: He is appears well-developed and well-nourished.  Cardiovascular: Normal rate, regular rhythm and normal heart sounds.  no BLE edema Pulmonary/Chest: Effort normal and breath sounds normal. No respiratory distress.   Neurological: He is alert and oriented to person, place, and time. No cranial nerve deficit.  Psychiatric:  He has a normal mood and affect. His behavior is normal.    Lab Results  Component Value Date   CHOL 123 03/17/2012   TRIG 120.0 03/17/2012   HDL 41.50 03/17/2012   ALT 17 03/17/2012   AST 22 03/17/2012   NA 136 03/17/2012   K 3.4* 03/17/2012   CL 99 03/17/2012   CREATININE 0.9 03/17/2012   BUN 11 03/17/2012   CO2 29 03/17/2012   HGBA1C 6.6* 03/17/2012       Assessment & Plan:   See problem list. Medications and labs reviewed today.

## 2012-09-22 NOTE — Progress Notes (Signed)
Subjective:    Patient ID: Colin Love, male    DOB: [birth year], 67 y.o.   MRN: [id number]  HPI Patient presents today for follow up of chronic medical conditions (HTN, DM, hyperlipidemia). He currently states he is feeling well. DM- states sugars have been under good control, but could not provide actual numbers. Patient has lost 10 pounds since last visit due to trying to eat well and decrease in appetite with new dentures. Taking his metformin daily; had annual DM eye check in February. No polydyspia, polyuria. HTN- states his pressure have been running around 118/70s. Today was slightly elevated at 142/84. No complaints of headache or blurry vision. Hyperlipidemia- lipid panel was checked in January and was acceptable. See labs below.  Patient continues to smoke a ppd and would like to discuss drug treatment for cessation today.  Past Medical History  Diagnosis Date  . COLONIC POLYPS, HX OF   . DIVERTICULITIS, HX OF   . DIABETES MELLITUS, TYPE II   . HYPERLIPIDEMIA   . HYPERTENSION    Family History  Problem Relation Age of Onset  . Colon cancer Mother   . Hypertension Mother   . Hyperlipidemia Mother   . Hypertension Father   . Hyperlipidemia Father   . Diabetes Brother    History  Substance Use Topics  . Smoking status: Current Every Day Smoker -- 1.00 packs/day  . Smokeless tobacco: Not on file     Comment: Married, lives w/ wife. Retired Insurance underwriter  . Alcohol Use: No    Review of Systems  Eyes: Negative for visual disturbance.  Respiratory: Negative for cough and shortness of breath.   Cardiovascular: Negative for chest pain and leg swelling.  Gastrointestinal: Negative for nausea and abdominal pain.  Endocrine: Negative for polydipsia, polyphagia and polyuria.        Current Outpatient Prescriptions on File Prior to Visit  Medication Sig Dispense Refill  . aspirin 81 MG tablet Take 81 mg by mouth daily.        Marland Kitchen atorvastatin (LIPITOR) 20 MG  tablet take 1 tablet by mouth once daily  30 tablet  2  . diltiazem (CARDIZEM CD) 180 MG 24 hr capsule take 1 capsule by mouth twice a day  60 capsule  2  . diphenhydrAMINE (BENADRYL) 25 MG tablet Take 25 mg by mouth every 6 (six) hours as needed.      Marland Kitchen lisinopril-hydrochlorothiazide (PRINZIDE,ZESTORETIC) 20-12.5 MG per tablet take 1 tablet by mouth twice a day  60 tablet  2  . metFORMIN (GLUCOPHAGE) 500 MG tablet take 1 tablet by mouth twice a day  180 tablet  1  . terazosin (HYTRIN) 10 MG capsule take 1 capsule once daily  90 capsule  1   No current facility-administered medications on file prior to visit.    Objective:   Physical Exam  Constitutional: He is oriented to person, place, and time. He appears well-developed and well-nourished.  Cardiovascular: Normal rate, regular rhythm and intact distal pulses.  Exam reveals no friction rub.   No murmur heard. Pulmonary/Chest: Breath sounds normal. He has no wheezes. He has no rales.  Abdominal: Soft. He exhibits no distension. There is no tenderness.  Musculoskeletal: He exhibits no edema.  Neurological: He is alert and oriented to person, place, and time. No cranial nerve deficit.  Skin: Skin is warm and dry. No erythema.  Psychiatric: He has a normal mood and affect.   Foot exam- without lesion, erythema or swelling. Pedal and posterior  tibial pulses intact. Sensation (dull and sharp) intact throughout.  Lab Results  Component Value Date   GLUCOSE 121* 03/17/2012   CHOL 123 03/17/2012   TRIG 120.0 03/17/2012   HDL 41.50 03/17/2012   LDLCALC 58 03/17/2012   ALT 17 03/17/2012   AST 22 03/17/2012   NA 136 03/17/2012   K 3.4* 03/17/2012   CL 99 03/17/2012   CREATININE 0.9 03/17/2012   BUN 11 03/17/2012   CO2 29 03/17/2012   HGBA1C 6.6* 03/17/2012        Assessment & Plan:   1. DM II- Will obtain A1C today. Continue foot check and eye exam annually. Continue current treatment with metformin unless changes in a1c.  2. HTN- currently stable,  continue current management with lisinopril-hctz.  3. Hyperlipidemia- will obtain lipid profile annually, continue statin.  4. Smoking cessation- provided counseling and prescribed buproprion x 12 weeks for smoking cessation. Follow up in 3-4 months to discuss success with cessation.  5. Health maintenance- up to date on vaccinations and screening.  Concha Se, [place] [facility]ighbor  I have personally reviewed this case with PA student. I also personally examined this patient. I agree with history and findings as documented above. I reviewed, discussed and approve of the assessment and plan as listed above. Rene Paci, MD

## 2012-09-22 NOTE — Assessment & Plan Note (Signed)
The current medical regimen is effective;  continue present plan and medications.  On statin, ACEI and ASA Check a1c q6 mo, titrate as needed  Lab Results  Component Value Date   HGBA1C 6.6* 03/17/2012

## 2012-09-22 NOTE — Patient Instructions (Addendum)
It was good to see you today. Test(s) ordered today. Your results will be released to MyChart (or called to you) after review, usually within 72hours after test completion. If any changes need to be made, you will be notified at that same time. Medications reviewed and updated Start Zyban for help with smoking cessation - take one daily x 3 days, then stop smoking and increase to 1 pill 2x/day Your prescription(s) have been submitted to your pharmacy. Please take as directed and contact our office if you believe you are having problem(s) with the medication(s). Please schedule followup in 3-4 months for diabetes check, call sooner if problems. Smoking Cessation Quitting smoking is important to your health and has many advantages. However, it is not always easy to quit since nicotine is a very addictive drug. Often times, people try 3 times or more before being able to quit. This document explains the best ways for you to prepare to quit smoking. Quitting takes hard work and a lot of effort, but you can do it. ADVANTAGES OF QUITTING SMOKING  You will live longer, feel better, and live better.   Your body will feel the impact of quitting smoking almost immediately.   Within 20 minutes, blood pressure decreases. Your pulse returns to its normal level.   After 8 hours, carbon monoxide levels in the blood return to normal. Your oxygen level increases.   After 24 hours, the chance of having a heart attack starts to decrease. Your breath, hair, and body stop smelling like smoke.   After 48 hours, damaged nerve endings begin to recover. Your sense of taste and smell improve.   After 72 hours, the body is virtually free of nicotine. Your bronchial tubes relax and breathing becomes easier.   After 2 to 12 weeks, lungs can hold more air. Exercise becomes easier and circulation improves.   The risk of having a heart attack, stroke, cancer, or lung disease is greatly reduced.   After 1 year, the risk  of coronary heart disease is cut in half.   After 5 years, the risk of stroke falls to the same as a nonsmoker.   After 10 years, the risk of lung cancer is cut in half and the risk of other cancers decreases significantly.   After 15 years, the risk of coronary heart disease drops, usually to the level of a nonsmoker.   If you are pregnant, quitting smoking will improve your chances of having a healthy baby.   The people you live with, especially any children, will be healthier.   You will have extra money to spend on things other than cigarettes.  QUESTIONS TO THINK ABOUT BEFORE ATTEMPTING TO QUIT You may want to talk about your answers with your caregiver.  Why do you want to quit?   If you tried to quit in the past, what helped and what did not?   What will be the most difficult situations for you after you quit? How will you plan to handle them?   Who can help you through the tough times? Your family? Friends? A caregiver?   What pleasures do you get from smoking? What ways can you still get pleasure if you quit?  Here are some questions to ask your caregiver:  How can you help me to be successful at quitting?   What medicine do you think would be best for me and how should I take it?   What should I do if I need more help?  What is smoking withdrawal like? How can I get information on withdrawal?  GET READY  Set a quit date.   Change your environment by getting rid of all cigarettes, ashtrays, matches, and lighters in your home, car, or work. Do not let people smoke in your home.   Review your past attempts to quit. Think about what worked and what did not.  GET SUPPORT AND ENCOURAGEMENT You have a better chance of being successful if you have help. You can get support in many ways.  Tell your family, friends, and co-workers that you are going to quit and need their support. Ask them not to smoke around you.   Get individual, group, or telephone counseling and  support. Programs are available at [employer] and health centers. Call your local health department for information about programs in your area.   Spiritual beliefs and practices may help some smokers quit.   Download a "quit meter" on your computer to keep track of quit statistics, such as how long you have gone without smoking, cigarettes not smoked, and money saved.   Get a self-help book about quitting smoking and staying off of tobacco.  LEARN NEW SKILLS AND BEHAVIORS  Distract yourself from urges to smoke. Talk to someone, go for a walk, or occupy your time with a task.   Change your normal routine. Take a different route to work. Drink tea instead of coffee. Eat breakfast in a different place.   Reduce your stress. Take a hot bath, exercise, or read a book.   Plan something enjoyable to do every day. Reward yourself for not smoking.   Explore interactive web-based programs that specialize in helping you quit.  GET MEDICINE AND USE IT CORRECTLY Medicines can help you stop smoking and decrease the urge to smoke. Combining medicine with the above behavioral methods and support can greatly increase your chances of successfully quitting smoking.  Nicotine replacement therapy helps deliver nicotine to your body without the negative effects and risks of smoking. Nicotine replacement therapy includes nicotine gum, lozenges, inhalers, nasal sprays, and skin patches. Some may be available over-the-counter and others require a prescription.   Antidepressant medicine helps people abstain from smoking, but how this works is unknown. This medicine is available by prescription.   Nicotinic receptor partial agonist medicine simulates the effect of nicotine in your brain. This medicine is available by prescription.  Ask your caregiver for advice about which medicines to use and how to use them based on your health history. Your caregiver will tell you what side effects to look out for if you  choose to be on a medicine or therapy. Carefully read the information on the package. Do not use any other product containing nicotine while using a nicotine replacement product.   RELAPSE OR DIFFICULT SITUATIONS Most relapses occur within the first 3 months after quitting. Do not be discouraged if you start smoking again. Remember, most people try several times before finally quitting. You may have symptoms of withdrawal because your body is used to nicotine. You may crave cigarettes, be irritable, feel very hungry, cough often, get headaches, or have difficulty concentrating. The withdrawal symptoms are only temporary. They are strongest when you first quit, but they will go away within 10 14 days. To reduce the chances of relapse, try to:  Avoid drinking alcohol. Drinking lowers your chances of successfully quitting.   Reduce the amount of caffeine you consume. Once you quit smoking, the amount of caffeine in your body  increases and can give you symptoms, such as a rapid heartbeat, sweating, and anxiety.   Avoid smokers because they can make you want to smoke.   Do not let weight gain distract you. Many smokers will gain weight when they quit, usually less than 10 pounds. Eat a healthy diet and stay active. You can always lose the weight gained after you quit.   Find ways to improve your mood other than smoking.  FOR MORE INFORMATION   www.smokefree.gov   Document Released: 02/17/2001 Document Revised: 08/25/2011 Document Reviewed: 06/04/2011 [facility] Patient Information 2013 [place], [place].

## 2012-09-22 NOTE — Assessment & Plan Note (Signed)
5 minutes today spent counseling patient on unhealthy effects of continued tobacco abuse and encouragement of cessation including medical options available to help the patient quit smoking. tried Nicorette gum 08/2011 -  Will try zyban as "cold [place]" plans for New Year 2014 were ineffective -

## 2012-09-22 NOTE — Assessment & Plan Note (Signed)
On statin since 2009 - LDL at goal, check annually The current medical regimen is effective;  continue present plan and medications. 

## 2012-09-22 NOTE — Assessment & Plan Note (Signed)
The current medical regimen is effective;  continue present plan and medications.  Manual recheck at goal today BP Readings from Last 3 Encounters:  09/22/12 142/84  03/17/12 118/72  11/05/11 130/60

## 2012-11-08 ENCOUNTER — Other Ambulatory Visit: Payer: Self-pay

## 2012-11-08 DIAGNOSIS — E785 Hyperlipidemia, unspecified: Secondary | ICD-10-CM

## 2012-11-08 DIAGNOSIS — I10 Essential (primary) hypertension: Secondary | ICD-10-CM

## 2012-11-08 MED ORDER — LISINOPRIL-HYDROCHLOROTHIAZIDE 20-12.5 MG PO TABS: 1.0000 | ORAL_TABLET | Freq: Two times a day (BID) | ORAL | Status: DC

## 2012-11-08 MED ORDER — ATORVASTATIN CALCIUM 20 MG PO TABS: 20.0000 mg | ORAL_TABLET | Freq: Every day | ORAL | Status: DC

## 2012-11-09 ENCOUNTER — Other Ambulatory Visit: Payer: Self-pay

## 2012-11-09 DIAGNOSIS — I10 Essential (primary) hypertension: Secondary | ICD-10-CM

## 2012-11-09 MED ORDER — DILTIAZEM HCL ER COATED BEADS 180 MG PO CP24: 180.0000 mg | ORAL_CAPSULE | Freq: Two times a day (BID) | ORAL | Status: DC

## 2012-11-09 NOTE — Telephone Encounter (Signed)
Sent in refill for diltiazem to [facility]...ds,cma

## 2013-01-24 ENCOUNTER — Ambulatory Visit: Payer: Medicare HMO | Admitting: Internal Medicine

## 2013-01-30 ENCOUNTER — Ambulatory Visit: Payer: Commercial Managed Care - HMO

## 2013-01-30 ENCOUNTER — Encounter: Payer: Self-pay | Admitting: Internal Medicine

## 2013-01-30 ENCOUNTER — Ambulatory Visit (INDEPENDENT_AMBULATORY_CARE_PROVIDER_SITE_OTHER): Payer: Medicare HMO | Admitting: Internal Medicine

## 2013-01-30 VITALS — BP 120/70 | HR 64 | Temp 97.9°F | Ht 67.0 in | Wt 189.1 lb

## 2013-01-30 DIAGNOSIS — E785 Hyperlipidemia, unspecified: Secondary | ICD-10-CM

## 2013-01-30 DIAGNOSIS — E119 Type 2 diabetes mellitus without complications: Secondary | ICD-10-CM

## 2013-01-30 DIAGNOSIS — F17200 Nicotine dependence, unspecified, uncomplicated: Secondary | ICD-10-CM

## 2013-01-30 DIAGNOSIS — I10 Essential (primary) hypertension: Secondary | ICD-10-CM

## 2013-01-30 DIAGNOSIS — Z23 Encounter for immunization: Secondary | ICD-10-CM

## 2013-01-30 LAB — HEMOGLOBIN A1C: Hgb A1c MFr Bld: 6.5 % (ref 4.6–6.5)

## 2013-01-30 LAB — LIPID PANEL
Cholesterol: 106 mg/dL (ref 0–200)
HDL: 41.6 mg/dL
LDL Cholesterol: 52 mg/dL (ref 0–99)
Total CHOL/HDL Ratio: 3
Triglycerides: 62 mg/dL (ref 0.0–149.0)
VLDL: 12.4 mg/dL (ref 0.0–40.0)

## 2013-01-30 NOTE — Assessment & Plan Note (Signed)
The current medical regimen is effective;  continue present plan and medications.  On statin, ACEI and ASA Check a1c q6 mo, titrate as needed  Lab Results  Component Value Date   HGBA1C 6.5 09/22/2012

## 2013-01-30 NOTE — Progress Notes (Signed)
  Subjective:    Patient ID: Colin Love, male    DOB: 1945-06-18, 67 y.o.   MRN: [id number]  HPI here for follow up - reviewed chronic medical issues today:  DM2 - reports compliance with ongoing medical treatment and no changes in medication dose or frequency. denies adverse side effects related to current therapy. checks sugars  <1 x/d, no signs or symptoms hypoglycemia  HTN - reports compliance with ongoing medical treatment and no changes in medication dose or frequency. denies adverse side effects related to current therapy.  no edema, headache, or chest pain   dyslipidemia - on statin tx since 2009 - reports compliance with ongoing medical treatment and no changes in medication dose or frequency. denies adverse side effects related to current therapy.  no GI or muscle aches  Past Medical History  Diagnosis Date  . COLONIC POLYPS, HX OF   . DIVERTICULITIS, HX OF   . DIABETES MELLITUS, TYPE II   . HYPERLIPIDEMIA   . HYPERTENSION      Review of Systems  Constitutional: Negative for unexpected weight change.  Respiratory: Negative for cough and shortness of breath.   Cardiovascular: Negative for chest pain, palpitations and leg swelling.  Gastrointestinal: Negative for abdominal pain.  Neurological: Negative for dizziness, syncope and headaches.       Objective:   Physical Exam BP 120/70  Pulse 64  Temp(Src) 97.9 F (36.6 C) (Oral)  Ht 5\' 7"  (1.702 m)  Wt 189 lb 2 oz (85.787 kg)  BMI 29.61 kg/m2 Wt Readings from Last 3 Encounters:  01/30/13 189 lb 2 oz (85.787 kg)  09/22/12 196 lb 6.4 oz (89.086 kg)  03/17/12 201 lb (91.173 kg)   Constitutional: He is appears well-developed and well-nourished.  Cardiovascular: Normal rate, regular rhythm and normal heart sounds.  no BLE edema Pulmonary/Chest: Effort normal and breath sounds normal. No respiratory distress.   Neurological: He is alert and oriented to person, place, and time. No cranial nerve deficit.   Psychiatric: He has a normal mood and affect. His behavior is normal.    Lab Results  Component Value Date   CHOL 123 03/17/2012   TRIG 120.0 03/17/2012   HDL 41.50 03/17/2012   ALT 17 03/17/2012   AST 22 03/17/2012   NA 136 03/17/2012   K 3.4* 03/17/2012   CL 99 03/17/2012   CREATININE 0.9 03/17/2012   BUN 11 03/17/2012   CO2 29 03/17/2012   HGBA1C 6.5 09/22/2012   MICROALBUR 49.5* 09/22/2012       Assessment & Plan:   See problem list. Medications and labs reviewed today.

## 2013-01-30 NOTE — Assessment & Plan Note (Signed)
On statin since 2009 - LDL at goal, check annually The current medical regimen is effective;  continue present plan and medications. 

## 2013-01-30 NOTE — Assessment & Plan Note (Signed)
The current medical regimen is effective;  continue present plan and medications.   BP Readings from Last 3 Encounters:  01/30/13 120/70  09/22/12 142/84  03/17/12 118/72

## 2013-01-30 NOTE — Progress Notes (Signed)
Pre visit review using our clinic review tool, if applicable. No additional management support is needed unless otherwise documented below in the visit note. 

## 2013-01-30 NOTE — Assessment & Plan Note (Signed)
5 minutes today spent counseling patient on unhealthy effects of continued tobacco abuse and encouragement of cessation including medical options available to help the patient quit smoking. tried Nicorette gum 08/2011 -  Will re-try zyban for new year 2015 - reviewed that "cold [place]" efforts New Year 2014 were ineffective -

## 2013-01-30 NOTE — Patient Instructions (Signed)
It was good to see you today.  We have reviewed your prior records including labs and tests today  Your annual flu shot was given and/or updated today.  Test(s) ordered today. Your results will be released to MyChart (or called to you) after review, usually within 72hours after test completion. If any changes need to be made, you will be notified at that same time.  Medications reviewed and updated - try Zyban again as planned for help with smoking cessation - no other changes recommended  Please schedule followup in 6 months for diabetes check and annual wellness visit, call sooner if problems. Smoking Cessation Quitting smoking is important to your health and has many advantages. However, it is not always easy to quit since nicotine is a very addictive drug. Often times, people try 3 times or more before being able to quit. This document explains the best ways for you to prepare to quit smoking. Quitting takes hard work and a lot of effort, but you can do it. ADVANTAGES OF QUITTING SMOKING  You will live longer, feel better, and live better.   Your body will feel the impact of quitting smoking almost immediately.   Within 20 minutes, blood pressure decreases. Your pulse returns to its normal level.   After 8 hours, carbon monoxide levels in the blood return to normal. Your oxygen level increases.   After 24 hours, the chance of having a heart attack starts to decrease. Your breath, hair, and body stop smelling like smoke.   After 48 hours, damaged nerve endings begin to recover. Your sense of taste and smell improve.   After 72 hours, the body is virtually free of nicotine. Your bronchial tubes relax and breathing becomes easier.   After 2 to 12 weeks, lungs can hold more air. Exercise becomes easier and circulation improves.   The risk of having a heart attack, stroke, cancer, or lung disease is greatly reduced.   After 1 year, the risk of coronary heart disease is cut in half.    After 5 years, the risk of stroke falls to the same as a nonsmoker.   After 10 years, the risk of lung cancer is cut in half and the risk of other cancers decreases significantly.   After 15 years, the risk of coronary heart disease drops, usually to the level of a nonsmoker.   If you are pregnant, quitting smoking will improve your chances of having a healthy baby.   The people you live with, especially any children, will be healthier.   You will have extra money to spend on things other than cigarettes.  QUESTIONS TO THINK ABOUT BEFORE ATTEMPTING TO QUIT You may want to talk about your answers with your caregiver.  Why do you want to quit?   If you tried to quit in the past, what helped and what did not?   What will be the most difficult situations for you after you quit? How will you plan to handle them?   Who can help you through the tough times? Your family? Friends? A caregiver?   What pleasures do you get from smoking? What ways can you still get pleasure if you quit?  Here are some questions to ask your caregiver:  How can you help me to be successful at quitting?   What medicine do you think would be best for me and how should I take it?   What should I do if I need more help?   What is smoking  withdrawal like? How can I get information on withdrawal?  GET READY  Set a quit date.   Change your environment by getting rid of all cigarettes, ashtrays, matches, and lighters in your home, car, or work. Do not let people smoke in your home.   Review your past attempts to quit. Think about what worked and what did not.  GET SUPPORT AND ENCOURAGEMENT You have a better chance of being successful if you have help. You can get support in many ways.  Tell your family, friends, and co-workers that you are going to quit and need their support. Ask them not to smoke around you.   Get individual, group, or telephone counseling and support. Programs are available at [employer] and health centers. Call your local health department for information about programs in your area.   Spiritual beliefs and practices may help some smokers quit.   Download a "quit meter" on your computer to keep track of quit statistics, such as how long you have gone without smoking, cigarettes not smoked, and money saved.   Get a self-help book about quitting smoking and staying off of tobacco.  LEARN NEW SKILLS AND BEHAVIORS  Distract yourself from urges to smoke. Talk to someone, go for a walk, or occupy your time with a task.   Change your normal routine. Take a different route to work. Drink tea instead of coffee. Eat breakfast in a different place.   Reduce your stress. Take a hot bath, exercise, or read a book.   Plan something enjoyable to do every day. Reward yourself for not smoking.   Explore interactive web-based programs that specialize in helping you quit.  GET MEDICINE AND USE IT CORRECTLY Medicines can help you stop smoking and decrease the urge to smoke. Combining medicine with the above behavioral methods and support can greatly increase your chances of successfully quitting smoking.  Nicotine replacement therapy helps deliver nicotine to your body without the negative effects and risks of smoking. Nicotine replacement therapy includes nicotine gum, lozenges, inhalers, nasal sprays, and skin patches. Some may be available over-the-counter and others require a prescription.   Antidepressant medicine helps people abstain from smoking, but how this works is unknown. This medicine is available by prescription.   Nicotinic receptor partial agonist medicine simulates the effect of nicotine in your brain. This medicine is available by prescription.  Ask your caregiver for advice about which medicines to use and how to use them based on your health history. Your caregiver will tell you what side effects to look out for if you choose to be on a medicine or therapy.  Carefully read the information on the package. Do not use any other product containing nicotine while using a nicotine replacement product.   RELAPSE OR DIFFICULT SITUATIONS Most relapses occur within the first 3 months after quitting. Do not be discouraged if you start smoking again. Remember, most people try several times before finally quitting. You may have symptoms of withdrawal because your body is used to nicotine. You may crave cigarettes, be irritable, feel very hungry, cough often, get headaches, or have difficulty concentrating. The withdrawal symptoms are only temporary. They are strongest when you first quit, but they will go away within 10 14 days. To reduce the chances of relapse, try to:  Avoid drinking alcohol. Drinking lowers your chances of successfully quitting.   Reduce the amount of caffeine you consume. Once you quit smoking, the amount of caffeine in your body increases and can  give you symptoms, such as a rapid heartbeat, sweating, and anxiety.   Avoid smokers because they can make you want to smoke.   Do not let weight gain distract you. Many smokers will gain weight when they quit, usually less than 10 pounds. Eat a healthy diet and stay active. You can always lose the weight gained after you quit.   Find ways to improve your mood other than smoking.  FOR MORE INFORMATION   www.smokefree.gov   Document Released: 02/17/2001 Document Revised: 08/25/2011 Document Reviewed: 06/04/2011 [facility] Patient Information 2013 [place], [place].

## 2013-02-07 ENCOUNTER — Other Ambulatory Visit: Payer: Self-pay | Admitting: *Deleted

## 2013-02-07 ENCOUNTER — Other Ambulatory Visit: Payer: Self-pay | Admitting: Internal Medicine

## 2013-02-07 DIAGNOSIS — I10 Essential (primary) hypertension: Secondary | ICD-10-CM

## 2013-02-07 MED ORDER — DILTIAZEM HCL ER COATED BEADS 180 MG PO CP24: 180.0000 mg | ORAL_CAPSULE | Freq: Two times a day (BID) | ORAL | Status: DC

## 2013-02-14 ENCOUNTER — Other Ambulatory Visit: Payer: Self-pay | Admitting: *Deleted

## 2013-02-14 NOTE — Telephone Encounter (Signed)
Sent to [facility] office. Ok to refill?

## 2013-02-15 ENCOUNTER — Other Ambulatory Visit: Payer: Self-pay | Admitting: *Deleted

## 2013-02-15 MED ORDER — METFORMIN HCL 500 MG PO TABS: 500.0000 mg | ORAL_TABLET | Freq: Two times a day (BID) | ORAL | Status: DC

## 2013-02-15 MED ORDER — TERAZOSIN HCL 10 MG PO CAPS: ORAL_CAPSULE | ORAL | Status: DC

## 2013-06-12 ENCOUNTER — Other Ambulatory Visit: Payer: Self-pay | Admitting: *Deleted

## 2013-06-12 DIAGNOSIS — I10 Essential (primary) hypertension: Secondary | ICD-10-CM

## 2013-06-12 NOTE — Telephone Encounter (Signed)
Faxed refill request sent to [facility].  Please advise.

## 2013-06-13 MED ORDER — DILTIAZEM HCL ER COATED BEADS 180 MG PO CP24: 180.0000 mg | ORAL_CAPSULE | Freq: Two times a day (BID) | ORAL | Status: DC

## 2013-08-01 ENCOUNTER — Other Ambulatory Visit: Payer: Self-pay | Admitting: Internal Medicine

## 2013-08-07 ENCOUNTER — Other Ambulatory Visit: Payer: Self-pay | Admitting: Internal Medicine

## 2013-08-09 ENCOUNTER — Other Ambulatory Visit: Payer: Self-pay | Admitting: Internal Medicine

## 2013-09-29 ENCOUNTER — Other Ambulatory Visit: Payer: Self-pay | Admitting: Internal Medicine

## 2013-11-23 ENCOUNTER — Encounter: Payer: Self-pay | Admitting: Internal Medicine

## 2013-12-26 ENCOUNTER — Ambulatory Visit (INDEPENDENT_AMBULATORY_CARE_PROVIDER_SITE_OTHER): Payer: Medicare HMO | Admitting: Internal Medicine

## 2013-12-26 ENCOUNTER — Encounter: Payer: Self-pay | Admitting: Internal Medicine

## 2013-12-26 ENCOUNTER — Other Ambulatory Visit (INDEPENDENT_AMBULATORY_CARE_PROVIDER_SITE_OTHER): Payer: Medicare HMO

## 2013-12-26 VITALS — BP 140/70 | HR 75 | Temp 98.5°F | Resp 14 | Wt 192.4 lb

## 2013-12-26 DIAGNOSIS — I499 Cardiac arrhythmia, unspecified: Secondary | ICD-10-CM

## 2013-12-26 DIAGNOSIS — R310 Gross hematuria: Secondary | ICD-10-CM

## 2013-12-26 DIAGNOSIS — I4891 Unspecified atrial fibrillation: Secondary | ICD-10-CM

## 2013-12-26 LAB — BASIC METABOLIC PANEL WITH GFR
BUN: 14 mg/dL (ref 6–23)
CO2: 25 meq/L (ref 19–32)
Calcium: 9.9 mg/dL (ref 8.4–10.5)
Chloride: 102 meq/L (ref 96–112)
Creatinine, Ser: 1 mg/dL (ref 0.4–1.5)
GFR: 101.08 mL/min
Glucose, Bld: 109 mg/dL — ABNORMAL HIGH (ref 70–99)
Potassium: 3.1 meq/L — ABNORMAL LOW (ref 3.5–5.1)
Sodium: 137 meq/L (ref 135–145)

## 2013-12-26 LAB — URINALYSIS, ROUTINE W REFLEX MICROSCOPIC
Bilirubin Urine: NEGATIVE
Ketones, ur: NEGATIVE
Nitrite: NEGATIVE
Specific Gravity, Urine: 1.02
Total Protein, Urine: 100 — AB
Urine Glucose: NEGATIVE
Urobilinogen, UA: 0.2
pH: 5.5 (ref 5.0–8.0)

## 2013-12-26 LAB — CBC WITH DIFFERENTIAL/PLATELET
Basophils Absolute: 0 10*3/uL (ref 0.0–0.1)
Basophils Relative: 0.2 % (ref 0.0–3.0)
Eosinophils Absolute: 0.1 10*3/uL (ref 0.0–0.7)
Eosinophils Relative: 0.6 % (ref 0.0–5.0)
HCT: 46.5 % (ref 39.0–52.0)
Hemoglobin: 15.2 g/dL (ref 13.0–17.0)
Lymphocytes Relative: 14.8 % (ref 12.0–46.0)
Lymphs Abs: 1.3 10*3/uL (ref 0.7–4.0)
MCHC: 32.8 g/dL (ref 30.0–36.0)
MCV: 86.2 fl (ref 78.0–100.0)
Monocytes Absolute: 0.7 10*3/uL (ref 0.1–1.0)
Monocytes Relative: 7.6 % (ref 3.0–12.0)
Neutro Abs: 6.9 10*3/uL (ref 1.4–7.7)
Neutrophils Relative %: 76.8 % (ref 43.0–77.0)
Platelets: 167 10*3/uL (ref 150.0–400.0)
RBC: 5.39 Mil/uL (ref 4.22–5.81)
RDW: 13.9 % (ref 11.5–15.5)
WBC: 9 10*3/uL (ref 4.0–10.5)

## 2013-12-26 LAB — MAGNESIUM: Magnesium: 1.6 mg/dL (ref 1.5–2.5)

## 2013-12-26 LAB — TSH: TSH: 0.21 u[IU]/mL — ABNORMAL LOW (ref 0.35–4.50)

## 2013-12-26 NOTE — Progress Notes (Signed)
Pre visit review using our clinic review tool, if applicable. No additional management support is needed unless otherwise documented below in the visit note. 

## 2013-12-26 NOTE — Patient Instructions (Addendum)
Your next office appointment will be determined based upon review of your pending labs & x-rays. Those instructions will be transmitted to you through My Chart  OR  by mail;whichever process is your choice to receive results & recommendations .  Followup as needed for your acute issue. Please report any significant change in your symptoms.  Please think about quitting smoking. Review the risks we discussed. Please call 1-800-QUIT-NOW [phone]) for free smoking cessation counseling.

## 2013-12-26 NOTE — Progress Notes (Signed)
   Subjective:    Patient ID: Colin Love, male    DOB: 04/02/1945, 68 y.o.   MRN: [id number]  HPI   He began to have hematuria 12/21/13 which has progressed to gross blood with each urination. He has had associated right flank pain up to a  level V which is intermittent but can last all day.  He had been on 81 mg aspirin but discontinued this.  He does describe slight dysuria with hematuria.  He has no past medical history of renal calculi.      Review of Systems   He denies fever, chills, or sweats. He also has no pyuria. There is no other bleeding dyscrasia.Epistaxis, hemoptysis, , melena, or rectal bleeding denied. No unexplained weight loss, significant dyspepsia,dysphagia, or abdominal pain.  There is no abnormal bruising , bleeding, or difficulty stopping bleeding with injury.     Objective:   Physical Exam    Pertinent or positive findings include: Arcus senilis is present. He has complete dentures. There is a rapid irregular, irregular rhythm and rate suggesting  a fibrillation clinically  General appearance :adequately nourished; in no distress. Eyes: No conjunctival inflammation or scleral icterus is present. Oral exam: Lips and gums are healthy appearing.There is no oropharyngeal erythema or exudate noted.  Heart:  No gallop, murmur, click, rub or other extra sounds   Lungs:Chest clear to auscultation; no wheezes, rhonchi,rales ,or rubs present.No increased work of breathing.  Abdomen: bowel sounds normal, soft and non-tender without masses, organomegaly or hernias noted.  No guarding or rebound. No flank tenderness to percussion. Vascular : all pulses equal ; no bruits present. Skin:Warm & dry.  Intact without suspicious lesions or rashes ; no jaundice or tenting Lymphatic: No lymphadenopathy is noted about the head, neck, axilla              Assessment & Plan:  #1 gross hematuria #2 abnormal heart rhythm; a fib with RVR on EKG.Anticoagualtion  not option with gross hematuria  See Orders

## 2013-12-27 ENCOUNTER — Ambulatory Visit (INDEPENDENT_AMBULATORY_CARE_PROVIDER_SITE_OTHER)
Admission: RE | Admit: 2013-12-27 | Discharge: 2013-12-27 | Disposition: A | Discharge status: Home / Self Care | Payer: Medicare HMO | Source: Ambulatory Visit | Attending: Internal Medicine | Admitting: Internal Medicine

## 2013-12-27 ENCOUNTER — Other Ambulatory Visit (INDEPENDENT_AMBULATORY_CARE_PROVIDER_SITE_OTHER): Payer: Medicare HMO

## 2013-12-27 ENCOUNTER — Telehealth: Payer: Self-pay

## 2013-12-27 ENCOUNTER — Other Ambulatory Visit: Payer: Self-pay | Admitting: Internal Medicine

## 2013-12-27 DIAGNOSIS — R310 Gross hematuria: Secondary | ICD-10-CM

## 2013-12-27 DIAGNOSIS — E119 Type 2 diabetes mellitus without complications: Secondary | ICD-10-CM

## 2013-12-27 LAB — HEMOGLOBIN A1C: Hgb A1c MFr Bld: 6.5 % (ref 4.6–6.5)

## 2013-12-27 NOTE — Telephone Encounter (Signed)
Please see comments on lab results. Lisinopril 40 mg qd in place of LisinoprilHCT bid Dx is hyperglycemia (Code should pop up). Thanks, [employer]

## 2013-12-28 ENCOUNTER — Other Ambulatory Visit: Payer: Self-pay

## 2013-12-28 LAB — CULTURE, URINE COMPREHENSIVE

## 2013-12-28 MED ORDER — LISINOPRIL 40 MG PO TABS: 40.0000 mg | ORAL_TABLET | Freq: Every day | ORAL | Status: DC

## 2014-01-09 ENCOUNTER — Ambulatory Visit (INDEPENDENT_AMBULATORY_CARE_PROVIDER_SITE_OTHER): Payer: Commercial Managed Care - HMO | Admitting: Internal Medicine

## 2014-01-09 ENCOUNTER — Encounter: Payer: Self-pay | Admitting: Internal Medicine

## 2014-01-09 VITALS — BP 138/82 | HR 61 | Temp 97.2°F | Ht 67.0 in | Wt 195.0 lb

## 2014-01-09 DIAGNOSIS — Z23 Encounter for immunization: Secondary | ICD-10-CM

## 2014-01-09 DIAGNOSIS — Z0000 Encounter for general adult medical examination without abnormal findings: Secondary | ICD-10-CM

## 2014-01-09 DIAGNOSIS — R310 Gross hematuria: Secondary | ICD-10-CM

## 2014-01-09 DIAGNOSIS — C679 Malignant neoplasm of bladder, unspecified: Secondary | ICD-10-CM | POA: Insufficient documentation

## 2014-01-09 DIAGNOSIS — I10 Essential (primary) hypertension: Secondary | ICD-10-CM

## 2014-01-09 DIAGNOSIS — E119 Type 2 diabetes mellitus without complications: Secondary | ICD-10-CM

## 2014-01-09 DIAGNOSIS — Z8551 Personal history of malignant neoplasm of bladder: Secondary | ICD-10-CM | POA: Insufficient documentation

## 2014-01-09 DIAGNOSIS — I480 Paroxysmal atrial fibrillation: Secondary | ICD-10-CM

## 2014-01-09 MED ORDER — DILTIAZEM HCL ER COATED BEADS 180 MG PO CP24: 180.0000 mg | ORAL_CAPSULE | Freq: Two times a day (BID) | ORAL | Status: DC

## 2014-01-09 MED ORDER — METFORMIN HCL 500 MG PO TABS: 500.0000 mg | ORAL_TABLET | Freq: Two times a day (BID) | ORAL | Status: DC

## 2014-01-09 MED ORDER — LISINOPRIL 40 MG PO TABS: 40.0000 mg | ORAL_TABLET | Freq: Every day | ORAL | Status: DC

## 2014-01-09 MED ORDER — ATORVASTATIN CALCIUM 20 MG PO TABS: 20.0000 mg | ORAL_TABLET | Freq: Every day | ORAL | Status: DC

## 2014-01-09 MED ORDER — TERAZOSIN HCL 10 MG PO CAPS: 10.0000 mg | ORAL_CAPSULE | Freq: Every day | ORAL | Status: DC

## 2014-01-09 NOTE — Patient Instructions (Addendum)
It was good to see you today.  We have reviewed your prior records including labs and tests today  Your annual flu shot was given and/or updated today. Will given Prevnar (pneumonia vaccine) next visit  Other Health Maintenance reviewed - all other recommended immunizations and age-appropriate screenings are up-to-date.  Test(s) ordered today. Return when fasting for cholesterol check. Your results will be released to [place] (or called to you) after review, usually within 72hours after test completion. If any changes need to be made, you will be notified at that same time.  Medications reviewed and updated, no changes recommended at this time. Refill on medication(s) as discussed today.  Please schedule followup in 6 months for semiannual diabetes mellitus exam and labs, call sooner if problems.  we'll make referral to eye doctor for eye check. Our office will contact you regarding appointment(s) once made.  Health Maintenance A healthy lifestyle and preventative care can promote health and wellness.  Maintain regular health, dental, and eye exams.  Eat a healthy diet. Foods like vegetables, fruits, whole grains, low-fat dairy products, and lean protein foods contain the nutrients you need and are low in calories. Decrease your intake of foods high in solid fats, added sugars, and salt. Get information about a proper diet from your health care provider, if necessary.  Regular physical exercise is one of the most important things you can do for your health. Most adults should get at least 150 minutes of moderate-intensity exercise (any activity that increases your heart rate and causes you to sweat) each week. In addition, most adults need muscle-strengthening exercises on 2 or more days a week.   Maintain a healthy weight. The body mass index (BMI) is a screening tool to identify possible weight problems. It provides an estimate of body fat based on height and weight. Your health care  provider can find your BMI and can help you achieve or maintain a healthy weight. For males 20 years and older:  A BMI below 18.5 is considered underweight.  A BMI of 18.5 to 24.9 is normal.  A BMI of 25 to 29.9 is considered overweight.  A BMI of 30 and above is considered obese.  Maintain normal blood lipids and cholesterol by exercising and minimizing your intake of saturated fat. Eat a balanced diet with plenty of fruits and vegetables. Blood tests for lipids and cholesterol should begin at age 55 and be repeated every 5 years. If your lipid or cholesterol levels are high, you are over age 26, or you are at high risk for heart disease, you may need your cholesterol levels checked more frequently.Ongoing high lipid and cholesterol levels should be treated with medicines if diet and exercise are not working.  If you smoke, find out from your health care provider how to quit. If you do not use tobacco, do not start.  Lung cancer screening is recommended for adults aged 24-80 years who are at high risk for developing lung cancer because of a history of smoking. A yearly low-dose CT scan of the lungs is recommended for people who have at least a 30-pack-year history of smoking and are current smokers or have quit within the past 15 years. A pack year of smoking is smoking an average of 1 pack of cigarettes a day for 1 year (for example, a 30-pack-year history of smoking could mean smoking 1 pack a day for 30 years or 2 packs a day for 15 years). Yearly screening should continue until the smoker has  stopped smoking for at least 15 years. Yearly screening should be stopped for people who develop a health problem that would prevent them from having lung cancer treatment.  If you choose to drink alcohol, do not have more than 2 drinks per day. One drink is considered to be 12 oz (360 mL) of beer, 5 oz (150 mL) of wine, or 1.5 oz (45 mL) of liquor.  Avoid the use of street drugs. Do not share needles  with anyone. Ask for help if you need support or instructions about stopping the use of drugs.  High blood pressure causes heart disease and increases the risk of stroke. Blood pressure should be checked at least every 1-2 years. Ongoing high blood pressure should be treated with medicines if weight loss and exercise are not effective.  If you are 60-90 years old, ask your health care provider if you should take aspirin to prevent heart disease.  Diabetes screening involves taking a blood sample to check your fasting blood sugar level. This should be done once every 3 years after age 66 if you are at a normal weight and without risk factors for diabetes. Testing should be considered at a younger age or be carried out more frequently if you are overweight and have at least 1 risk factor for diabetes.  Colorectal cancer can be detected and often prevented. Most routine colorectal cancer screening begins at the age of 96 and continues through age 73. However, your health care provider may recommend screening at an earlier age if you have risk factors for colon cancer. On a yearly basis, your health care provider may provide home test kits to check for hidden blood in the stool. A small camera at the end of a tube may be used to directly examine the colon (sigmoidoscopy or colonoscopy) to detect the earliest forms of colorectal cancer. Talk to your health care provider about this at age 37 when routine screening begins. A direct exam of the colon should be repeated every 5-10 years through age 39, unless early forms of precancerous polyps or small growths are found.  People who are at an increased risk for hepatitis B should be screened for this virus. You are considered at high risk for hepatitis B if:  You were born in a country where hepatitis B occurs often. Talk with your health care provider about which countries are considered high risk.  Your parents were born in a high-risk country and you have  not received a shot to protect against hepatitis B (hepatitis B vaccine).  You have HIV or AIDS.  You use needles to inject street drugs.  You live with, or have sex with, someone who has hepatitis B.  You are a man who has sex with other men (MSM).  You get hemodialysis treatment.  You take certain medicines for conditions like cancer, organ transplantation, and autoimmune conditions.  Hepatitis C blood testing is recommended for all people born from [birth year] through 1966 and any individual with known risk factors for hepatitis C.  Healthy men should no longer receive prostate-specific antigen (PSA) blood tests as part of routine cancer screening. Talk to your health care provider about prostate cancer screening.  Testicular cancer screening is not recommended for adolescents or adult males who have no symptoms. Screening includes self-exam, a health care provider exam, and other screening tests. Consult with your health care provider about any symptoms you have or any concerns you have about testicular cancer.  Practice safe sex. Use  condoms and avoid high-risk sexual practices to reduce the spread of sexually transmitted infections (STIs).  You should be screened for STIs, including gonorrhea and chlamydia if:  You are sexually active and are younger than 24 years.  You are older than 24 years, and your health care provider tells you that you are at risk for this type of infection.  Your sexual activity has changed since you were last screened, and you are at an increased risk for chlamydia or gonorrhea. Ask your health care provider if you are at risk.  If you are at risk of being infected with HIV, it is recommended that you take a prescription medicine daily to prevent HIV infection. This is called pre-exposure prophylaxis (PrEP). You are considered at risk if:  You are a man who has sex with other men (MSM).  You are a heterosexual man who is sexually active with multiple  partners.  You take drugs by injection.  You are sexually active with a partner who has HIV.  Talk with your health care provider about whether you are at high risk of being infected with HIV. If you choose to begin PrEP, you should first be tested for HIV. You should then be tested every 3 months for as long as you are taking PrEP.  Use sunscreen. Apply sunscreen liberally and repeatedly throughout the day. You should seek shade when your shadow is shorter than you. Protect yourself by wearing long sleeves, pants, a wide-brimmed hat, and sunglasses year round whenever you are outdoors.  Tell your health care provider of new moles or changes in moles, especially if there is a change in shape or color. Also, tell your health care provider if a mole is larger than the size of a pencil eraser.  A one-time screening for abdominal aortic aneurysm (AAA) and surgical repair of large AAAs by ultrasound is recommended for men aged 60-75 years who are current or former smokers.  Stay current with your vaccines (immunizations). Document Released: 08/22/2007 Document Revised: 02/28/2013 Document Reviewed: 07/21/2010 [facility] Patient Information 2015 [place], [place]. This information is not intended to replace advice given to you by your health care provider. Make sure you discuss any questions you have with your health care provider.

## 2014-01-09 NOTE — Assessment & Plan Note (Signed)
The current medical regimen is effective;  continue present plan and medications.  On statin, ACEI and ASA Check a1c q6 mo, titrate as needed  Lab Results  Component Value Date   HGBA1C 6.5 12/27/2013

## 2014-01-09 NOTE — Progress Notes (Signed)
Subjective:    Patient ID: Colin Love, male    DOB: [birth year], 68 y.o.   MRN: [id number]  HPI   Here for annual wellness  Diet: heart healthy, diabetic Physical activity: sedentary Depression/mood screen: negative Hearing: intact to whispered voice Visual acuity: grossly normal, performs annual eye exam  ADLs: capable Fall risk: none Home safety: good Cognitive evaluation: intact to orientation, naming, recall and repetition EOL planning: adv directives, full code/ I agree  I have personally reviewed and have noted 1. The patient's medical and social history 2. Their use of alcohol, tobacco or illicit drugs 3. Their current medications and supplements 4. The patient's functional ability including ADL's, fall risks, home safety risks and hearing or visual impairment. 5. Diet and physical activities 6. Evidence for depression or mood disorders  Also reviewed chronic medical issues and interval medical events  Past Medical History  Diagnosis Date  . COLONIC POLYPS, HX OF   . DIVERTICULITIS, HX OF   . DIABETES MELLITUS, TYPE II   . HYPERLIPIDEMIA   . HYPERTENSION    Family History  Problem Relation Age of Onset  . Colon cancer Mother   . Hypertension Mother   . Hyperlipidemia Mother   . Hypertension Father   . Hyperlipidemia Father   . Diabetes Brother    History  Substance Use Topics  . Smoking status: Current Every Day Smoker -- 1.00 packs/day  . Smokeless tobacco: Not on file     Comment: Married, lives w/ wife. Retired Optician, dispensing  . Alcohol Use: No    Review of Systems  Constitutional: Negative for fever, activity change, appetite change, fatigue and unexpected weight change.  Respiratory: Negative for cough, chest tightness, shortness of breath and wheezing.   Cardiovascular: Negative for chest pain, palpitations and leg swelling.  Genitourinary: Positive for hematuria (OV 12/26/13 for same reviewed, cleared for last 4-5 days).  Negative for frequency and flank pain.  Neurological: Negative for dizziness, weakness and headaches.  Psychiatric/Behavioral: Negative for dysphoric mood. The patient is not nervous/anxious.   All other systems reviewed and are negative.      Objective:   Physical Exam  BP 138/82 mmHg  Pulse 61  Temp(Src) 97.2 F (36.2 C) (Oral)  Ht 5\' 7"  (1.702 m)  Wt 195 lb (88.451 kg)  BMI 30.53 kg/m2  SpO2 96% Wt Readings from Last 3 Encounters:  01/09/14 195 lb (88.451 kg)  12/26/13 192 lb 6 oz (87.261 kg)  01/30/13 189 lb 2 oz (85.787 kg)   Constitutional: he appears well-developed and well-nourished. No distress.  Neck: Normal range of motion. Neck supple. No JVD present. No thyromegaly present.  Cardiovascular: Normal rate, regular rhythm and normal heart sounds.  No murmur heard. No BLE edema. Pulmonary/Chest: Effort normal and breath sounds normal. No respiratory distress. he has no wheezes.  Psychiatric: he has a normal mood and affect. His behavior is normal. Judgment and thought content normal.   Lab Results  Component Value Date   WBC 9.0 12/26/2013   HGB 15.2 12/26/2013   HCT 46.5 12/26/2013   PLT 167.0 12/26/2013   GLUCOSE 109* 12/26/2013   CHOL 106 01/30/2013   TRIG 62.0 01/30/2013   HDL 41.60 01/30/2013   LDLCALC 52 01/30/2013   ALT 17 03/17/2012   AST 22 03/17/2012   NA 137 12/26/2013   K 3.1* 12/26/2013   CL 102 12/26/2013   CREATININE 1.0 12/26/2013   BUN 14 12/26/2013   CO2 25 12/26/2013   TSH  0.21* 12/26/2013   HGBA1C 6.5 12/27/2013   MICROALBUR 49.5* 09/22/2012    Ct Renal Stone Study  12/27/2013   CLINICAL DATA:  Five-day history of gross hematuria and dysuria. Intermittent right flank tenderness. Evaluate for kidney stone. Initial encounter  EXAM: CT ABDOMEN AND PELVIS WITHOUT CONTRAST  TECHNIQUE: Multidetector CT imaging of the abdomen and pelvis was performed following the standard protocol without IV contrast.  COMPARISON:  None.  FINDINGS: BODY  WALL: Unremarkable.  LOWER CHEST: Right coronary artery atherosclerosis.  ABDOMEN/PELVIS:  Liver: No focal abnormality.  Biliary: No evidence of biliary obstruction or stone.  Pancreas: Unremarkable.  Spleen: Unremarkable.  Adrenals: There is bilateral adrenal thickening, with superimposed nodularity on the left. When the density is measured in areas of focal nodularity, it measures 0 to -15. Largest discrete nodular area is 14 mm.  Kidneys and ureters: No hydronephrosis or stone. No evidence of renal inflammation or mass.  Bladder: Unremark able.  Reproductive:  Mild symmetric enlargement of the prostate.  Bowel: Distal colonic diverticulosis. No active inflammation. No bowel obstruction. Normal appendix.  Retroperitoneum: No mass or adenopathy.  Peritoneum: No ascites or pneumoperitoneum.  Vascular: No acute abnormality. Diffuse aortic and branch vessel atherosclerosis.  OSSEOUS: Diffuse lumbar spondylosis. No fracture or other acute finding.  IMPRESSION: 1. No hydronephrosis or urolithiasis. 2. Colonic diverticulosis. 3. Left adrenal nodules consistent with adenomas.   Electronically Signed   By: Jorje Guild M.D.   On: 12/27/2013 11:37       Assessment & Plan:   AWV/z00.00 - Today patient counseled on age appropriate routine health concerns for screening and prevention, each reviewed and up to date or declined. Immunizations reviewed and up to date or declined. Labs ordered and reviewed. Risk factors for depression reviewed and negative. Hearing function and visual acuity are intact. ADLs screened and addressed as needed. Functional ability and level of safety reviewed and appropriate. Education, counseling and referrals performed based on assessed risks today. Patient provided with a copy of personalized plan for preventive services.  Problem List Items Addressed This Visit    Diabetes type 2, controlled    The current medical regimen is effective;  continue present plan and medications.  On  statin, ACEI and ASA Check a1c q6 mo, titrate as needed  Lab Results  Component Value Date   HGBA1C 6.5 12/27/2013      Relevant Medications      atorvastatin (LIPITOR) tablet      lisinopril (PRINIVIL,ZESTRIL) tablet      metFORMIN (GLUCOPHAGE) tablet   Other Relevant Orders      Lipid panel      Microalbumin / creatinine urine ratio      Ambulatory referral to Ophthalmology   Hematuria, gross    Episode mid Oct reviewed - resolved spont in past 4-5 day Ok to resume ASA 81 and follow up with uro as planned this week    Paroxysmal atrial fibrillation    Incidental on ECG 10/20 during eval for gross hematuria Not on anticoag due to recent gross hematuria pending uro eval Appears NSR on exam today Pending cards eval for same later this mo - continue CCB and mgmt per cards    Relevant Medications      atorvastatin (LIPITOR) tablet      diltiazem (CARDIZEM CD) 24 hr capsule      lisinopril (PRINIVIL,ZESTRIL) tablet      terazosin (HYTRIN) capsule    Other Visit Diagnoses    Routine general medical examination at  a health care facility    -  Primary    Need for prophylactic vaccination and inoculation against influenza        Relevant Orders       Flu vaccine HIGH DOSE PF (Fluzone Tri High dose) (Completed)    Essential hypertension        Relevant Medications       atorvastatin (LIPITOR) tablet       diltiazem (CARDIZEM CD) 24 hr capsule       lisinopril (PRINIVIL,ZESTRIL) tablet       terazosin (HYTRIN) capsule

## 2014-01-09 NOTE — Progress Notes (Signed)
Pre visit review using our clinic review tool, if applicable. No additional management support is needed unless otherwise documented below in the visit note. 

## 2014-01-09 NOTE — Assessment & Plan Note (Signed)
Episode mid Oct reviewed - resolved spont in past 4-5 day Ok to resume ASA 81 and follow up with uro as planned this week

## 2014-01-09 NOTE — Assessment & Plan Note (Signed)
Incidental on ECG 10/20 during eval for gross hematuria Not on anticoag due to recent gross hematuria pending uro eval Appears NSR on exam today Pending cards eval for same later this mo - continue CCB and mgmt per cards

## 2014-01-10 ENCOUNTER — Telehealth: Payer: Self-pay | Admitting: Internal Medicine

## 2014-01-10 NOTE — Telephone Encounter (Signed)
emmi mailed  °

## 2014-01-11 ENCOUNTER — Telehealth: Payer: Self-pay | Admitting: Internal Medicine

## 2014-01-11 NOTE — Telephone Encounter (Signed)
Colin Love from [facility] - Dr Herrick's office calling for clearance for surgery (Bladder Tumor surgery). Form will be faxed to be filled out.

## 2014-01-12 NOTE — Telephone Encounter (Signed)
Will complete same Mon when i return to clinic thanks

## 2014-01-15 ENCOUNTER — Other Ambulatory Visit (INDEPENDENT_AMBULATORY_CARE_PROVIDER_SITE_OTHER): Payer: Commercial Managed Care - HMO

## 2014-01-15 DIAGNOSIS — E119 Type 2 diabetes mellitus without complications: Secondary | ICD-10-CM

## 2014-01-15 LAB — LIPID PANEL
Cholesterol: 120 mg/dL (ref 0–200)
HDL: 38.3 mg/dL — ABNORMAL LOW
LDL Cholesterol: 66 mg/dL (ref 0–99)
NonHDL: 81.7
Total CHOL/HDL Ratio: 3
Triglycerides: 80 mg/dL (ref 0.0–149.0)
VLDL: 16 mg/dL (ref 0.0–40.0)

## 2014-01-15 LAB — MICROALBUMIN / CREATININE URINE RATIO
CREATININE, U: 215.5 mg/dL
Microalb Creat Ratio: 31 mg/g — ABNORMAL HIGH (ref 0.0–30.0)
Microalb, Ur: 66.9 mg/dL — ABNORMAL HIGH (ref 0.0–1.9)

## 2014-01-19 ENCOUNTER — Other Ambulatory Visit: Payer: Self-pay | Admitting: Urology

## 2014-01-24 ENCOUNTER — Ambulatory Visit (INDEPENDENT_AMBULATORY_CARE_PROVIDER_SITE_OTHER): Payer: Medicare HMO | Admitting: Cardiology

## 2014-01-24 ENCOUNTER — Encounter: Payer: Self-pay | Admitting: Cardiology

## 2014-01-24 VITALS — BP 124/82 | HR 90 | Ht 67.0 in | Wt 195.0 lb

## 2014-01-24 DIAGNOSIS — I491 Atrial premature depolarization: Secondary | ICD-10-CM | POA: Insufficient documentation

## 2014-01-24 DIAGNOSIS — I480 Paroxysmal atrial fibrillation: Secondary | ICD-10-CM

## 2014-01-24 DIAGNOSIS — I10 Essential (primary) hypertension: Secondary | ICD-10-CM

## 2014-01-24 DIAGNOSIS — R319 Hematuria, unspecified: Secondary | ICD-10-CM

## 2014-01-24 DIAGNOSIS — I498 Other specified cardiac arrhythmias: Secondary | ICD-10-CM

## 2014-01-24 NOTE — Patient Instructions (Signed)
The current medical regimen is effective;  continue present plan and medications.  Follow up in 6 months with Dr. Skains.  You will receive a letter in the mail 2 months before you are due.  Please call us when you receive this letter to schedule your follow up appointment.  

## 2014-01-24 NOTE — Progress Notes (Signed)
[facility]. [address], [address], [place]  [address] Phone: [phone] Fax:  [phone]  Date:  01/24/2014   ID:  ALOYSUIS RIBAUDO, DOB 1945-04-30, MRN [id number]  PCP:  Gwendolyn Grant, MD   History of Present Illness: Colin Love is a 68 y.o. male here for evaluation of arrhythmia. Paroxysmal atrial fibrillation after being found incidentally on EKG 12/26/13 during evaluation of gross hematuria. Currently in normal rhythm. Not on anticoagulation due to gross hematuria. Calcium channel blocker noted. Aspirin.  When closely inspecting original EKG, there does appear to be P wave activity present. Please see below.  He is having no symptoms, no chest pain, no shortness of breath, no strokelike symptoms. He is diabetic. He is treated for hypertension.  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   Wt Readings from Last 3 Encounters:  01/24/14 195 lb (88.451 kg)  01/09/14 195 lb (88.451 kg)  12/26/13 192 lb 6 oz (87.261 kg)     Past Medical History  Diagnosis Date  . COLONIC POLYPS, HX OF   . DIVERTICULITIS, HX OF   . DIABETES MELLITUS, TYPE II   . HYPERLIPIDEMIA   . HYPERTENSION     Past Surgical History  Procedure Laterality Date  . Cervical fusion  2006    x's 3    Current Outpatient Prescriptions  Medication Sig Dispense Refill  . aspirin 81 MG tablet Take 81 mg by mouth daily.      Marland Kitchen atorvastatin (LIPITOR) 20 MG tablet Take 1 tablet (20 mg total) by mouth daily at 6 PM. 90 tablet 5  . buPROPion (ZYBAN) 150 MG 12 hr tablet Take 1 tablet (150 mg total) by mouth 2 (two) times daily. 60 tablet 3  . diltiazem (CARDIZEM CD) 180 MG 24 hr capsule Take 1 capsule (180 mg total) by mouth 2 (two) times daily. 180 capsule 3  . diphenhydrAMINE (BENADRYL) 25 MG tablet Take 25 mg by mouth every 6 (six) hours as needed.    Marland Kitchen lisinopril  (PRINIVIL,ZESTRIL) 40 MG tablet Take 1 tablet (40 mg total) by mouth daily. 90 tablet 3  . metFORMIN (GLUCOPHAGE) 500 MG tablet Take 1 tablet (500 mg total) by mouth 2 (two) times daily with a meal. 180 tablet 3  . terazosin (HYTRIN) 10 MG capsule Take 1 capsule (10 mg total) by mouth at bedtime. 90 capsule 3   No current facility-administered medications for this visit.    Allergies:   No Known Allergies  Social History:  The patient  reports that he has been smoking.  He does not have any smokeless tobacco history on file. He reports that he does not drink alcohol or use illicit drugs.   Family History  Problem Relation Age of Onset  . Colon cancer Mother   . Hypertension Mother   . Hyperlipidemia Mother   . Hypertension Father   . Hyperlipidemia Father   . Diabetes Brother     ROS:  Please see the history of present illness.   Denies any fevers, chills, orthopnea, PND, syncope. Positive for hematuria. No chest pain, no strokelike symptoms.   All other systems reviewed and negative.   PHYSICAL EXAM: VS:  BP 124/82 mmHg  Pulse 58  Ht 5\' 7"  (1.702 m)  Wt 195 lb (88.451 kg)  BMI 30.53  kg/m2 Well nourished, well developed, in no acute distress HEENT: normal, [place]/AT, EOMI Neck: no JVD, normal carotid upstroke, no bruit Cardiac:  normal S1, S2; RRR; no murmuroccasional ectopy Lungs:  clear to auscultation bilaterally, no wheezing, rhonchi or rales Abd: soft, nontender, no hepatomegaly, no bruits Ext: no edema, 2+ distal pulses Skin: warm and dry GU: deferred Neuro: no focal abnormalities noted, AAO x 3  EKG:  12/26/13-there is some baseline artifact present and I do see P waves throughout EKG. There is a chance that this is sinus rhythm with premature atrial contractions, potentially paroxysmal atrial tachycardia and not atrial fibrillation or atrial flutter.    01/24/14-sinus rhythm with premature atrial complexes, heart rate 90, left axis deviation  ASSESSMENT AND  PLAN:  1. Arrhythmia- I reviewed earlier EKG from 12/26/13 and there does appear to be P wave activity. It is possible that this was a premature atrial contraction/paroxysmal atrial tachycardia and not atrial fibrillation or atrial flutter. Of course, this means that he does not need anticoagulation. Repeat EKG today and this clearly shows sinus rhythm with premature atrial complexes. We will continue to monitor this and I will see him back in 6 months. 2. hematuria-seeing urology. 3. diabetes-per primary team 4. Hypertension-well controlled  Signed, Candee Furbish, MD [facility]  01/24/2014 2:36 PM

## 2014-01-24 NOTE — Addendum Note (Signed)
Addended by: Candee Furbish C on: 01/24/2014 03:05 PM   Modules accepted: Level of Service

## 2014-01-25 ENCOUNTER — Other Ambulatory Visit: Payer: Self-pay | Admitting: Internal Medicine

## 2014-01-31 ENCOUNTER — Encounter (HOSPITAL_BASED_OUTPATIENT_CLINIC_OR_DEPARTMENT_OTHER): Payer: Self-pay | Admitting: *Deleted

## 2014-02-06 ENCOUNTER — Encounter (HOSPITAL_BASED_OUTPATIENT_CLINIC_OR_DEPARTMENT_OTHER): Payer: Self-pay | Admitting: *Deleted

## 2014-02-06 DIAGNOSIS — Z8551 Personal history of malignant neoplasm of bladder: Secondary | ICD-10-CM

## 2014-02-06 DIAGNOSIS — C679 Malignant neoplasm of bladder, unspecified: Secondary | ICD-10-CM

## 2014-02-06 HISTORY — DX: Malignant neoplasm of bladder, unspecified: C67.9

## 2014-02-06 HISTORY — DX: Personal history of malignant neoplasm of bladder: Z85.51

## 2014-02-06 NOTE — Progress Notes (Signed)
   02/06/14 1135  OBSTRUCTIVE SLEEP APNEA  Have you ever been diagnosed with sleep apnea through a sleep study? No  Do you snore loudly (loud enough to be heard through closed doors)?  0  Do you often feel tired, fatigued, or sleepy during the daytime? 0  Has anyone observed you stop breathing during your sleep? 0  Do you have, or are you being treated for high blood pressure? 1  BMI more than 35 kg/m2? 0  Age over 69 years old? 1  Neck circumference greater than 40 cm/16 inches? 1  Gender: 1  Obstructive Sleep Apnea Score 4  Score 4 or greater  Results sent to PCP

## 2014-02-06 NOTE — Progress Notes (Signed)
NPO AFTER MN. ARRIVE AT 1950. NEEDS ISTAT. CURRENT EKG IN CHART AND EPIC. WILL TAKE CARDIZEM AM DOS W/ SIPS OF WATER.

## 2014-02-09 ENCOUNTER — Encounter (HOSPITAL_BASED_OUTPATIENT_CLINIC_OR_DEPARTMENT_OTHER): Payer: Self-pay | Admitting: Anesthesiology

## 2014-02-09 ENCOUNTER — Encounter (HOSPITAL_BASED_OUTPATIENT_CLINIC_OR_DEPARTMENT_OTHER)
Admission: RE | Disposition: A | Discharge status: Home / Self Care | Payer: Self-pay | Source: Ambulatory Visit | Attending: Urology

## 2014-02-09 ENCOUNTER — Ambulatory Visit (HOSPITAL_BASED_OUTPATIENT_CLINIC_OR_DEPARTMENT_OTHER): Payer: Commercial Managed Care - HMO | Admitting: Anesthesiology

## 2014-02-09 ENCOUNTER — Ambulatory Visit (HOSPITAL_BASED_OUTPATIENT_CLINIC_OR_DEPARTMENT_OTHER)
Admission: RE | Admit: 2014-02-09 | Discharge: 2014-02-09 | Disposition: A | Discharge status: Home / Self Care | Payer: Commercial Managed Care - HMO | Source: Ambulatory Visit | Attending: Urology | Admitting: Urology

## 2014-02-09 DIAGNOSIS — E669 Obesity, unspecified: Secondary | ICD-10-CM | POA: Insufficient documentation

## 2014-02-09 DIAGNOSIS — I10 Essential (primary) hypertension: Secondary | ICD-10-CM | POA: Insufficient documentation

## 2014-02-09 DIAGNOSIS — R310 Gross hematuria: Secondary | ICD-10-CM | POA: Diagnosis not present

## 2014-02-09 DIAGNOSIS — Z6830 Body mass index (BMI) 30.0-30.9, adult: Secondary | ICD-10-CM | POA: Insufficient documentation

## 2014-02-09 DIAGNOSIS — Z7982 Long term (current) use of aspirin: Secondary | ICD-10-CM | POA: Insufficient documentation

## 2014-02-09 DIAGNOSIS — K219 Gastro-esophageal reflux disease without esophagitis: Secondary | ICD-10-CM | POA: Insufficient documentation

## 2014-02-09 DIAGNOSIS — E119 Type 2 diabetes mellitus without complications: Secondary | ICD-10-CM | POA: Insufficient documentation

## 2014-02-09 DIAGNOSIS — Z79899 Other long term (current) drug therapy: Secondary | ICD-10-CM | POA: Diagnosis not present

## 2014-02-09 DIAGNOSIS — E785 Hyperlipidemia, unspecified: Secondary | ICD-10-CM | POA: Insufficient documentation

## 2014-02-09 DIAGNOSIS — F17210 Nicotine dependence, cigarettes, uncomplicated: Secondary | ICD-10-CM | POA: Diagnosis not present

## 2014-02-09 DIAGNOSIS — C671 Malignant neoplasm of dome of bladder: Secondary | ICD-10-CM | POA: Insufficient documentation

## 2014-02-09 HISTORY — DX: Benign neoplasm of left adrenal gland: D35.02

## 2014-02-09 HISTORY — DX: Benign prostatic hyperplasia without lower urinary tract symptoms: N40.0

## 2014-02-09 HISTORY — DX: Personal history of adenomatous and serrated colon polyps: Z86.0101

## 2014-02-09 HISTORY — DX: Gastro-esophageal reflux disease without esophagitis: K21.9

## 2014-02-09 HISTORY — DX: Complete loss of teeth, unspecified cause, unspecified class: K08.109

## 2014-02-09 HISTORY — DX: Type 2 diabetes mellitus without complications: E11.9

## 2014-02-09 HISTORY — DX: Complete loss of teeth, unspecified cause, unspecified class: Z97.2

## 2014-02-09 HISTORY — PX: TRANSURETHRAL RESECTION OF BLADDER TUMOR WITH GYRUS (TURBT-GYRUS): SHX6458

## 2014-02-09 HISTORY — DX: Other specified personal risk factors, not elsewhere classified: Z91.89

## 2014-02-09 HISTORY — DX: Diverticulosis of large intestine without perforation or abscess without bleeding: K57.30

## 2014-02-09 HISTORY — DX: Hyperlipidemia, unspecified: E78.5

## 2014-02-09 HISTORY — DX: Personal history of other diseases of the digestive system: Z87.19

## 2014-02-09 HISTORY — DX: Atrial premature depolarization: I49.1

## 2014-02-09 HISTORY — DX: Personal history of colonic polyps: Z86.010

## 2014-02-09 HISTORY — PX: CYSTOSCOPY W/ RETROGRADES: SHX1426

## 2014-02-09 HISTORY — DX: Essential (primary) hypertension: I10

## 2014-02-09 LAB — GLUCOSE, CAPILLARY: Glucose-Capillary: 113 mg/dL — ABNORMAL HIGH (ref 70–99)

## 2014-02-09 SURGERY — CYSTOSCOPY WITH RETROGRADE PYELOGRAM
Anesthesia: General | Site: Ureter

## 2014-02-09 MED ORDER — ACETAMINOPHEN-CODEINE #3 300-30 MG PO TABS: 1.0000 | ORAL_TABLET | ORAL | Status: DC | PRN

## 2014-02-09 MED ORDER — BELLADONNA ALKALOIDS-OPIUM 16.2-60 MG RE SUPP
RECTAL | Status: DC | PRN
  Administered 2014-02-09: 1 via RECTAL

## 2014-02-09 MED ORDER — MIDAZOLAM HCL 5 MG/5ML IJ SOLN
INTRAMUSCULAR | Status: DC | PRN
  Administered 2014-02-09: 2 mg via INTRAVENOUS

## 2014-02-09 MED ORDER — IOHEXOL 350 MG/ML SOLN
INTRAVENOUS | Status: DC | PRN
  Administered 2014-02-09: 10 mL via INTRAVENOUS

## 2014-02-09 MED ORDER — MITOMYCIN CHEMO FOR BLADDER INSTILLATION 40 MG
40.0000 mg | Freq: Once | INTRAVENOUS | Status: DC
  Filled 2014-02-09: qty 40

## 2014-02-09 MED ORDER — BELLADONNA ALKALOIDS-OPIUM 16.2-60 MG RE SUPP
RECTAL | Status: AC
  Filled 2014-02-09: qty 1

## 2014-02-09 MED ORDER — PHENAZOPYRIDINE HCL 200 MG PO TABS: 200.0000 mg | ORAL_TABLET | Freq: Three times a day (TID) | ORAL | Status: DC | PRN

## 2014-02-09 MED ORDER — FENTANYL CITRATE 0.05 MG/ML IJ SOLN
INTRAMUSCULAR | Status: AC
  Filled 2014-02-09: qty 4

## 2014-02-09 MED ORDER — LIDOCAINE HCL 2 % EX GEL
CUTANEOUS | Status: DC | PRN
  Administered 2014-02-09: 1 "application " via URETHRAL

## 2014-02-09 MED ORDER — FENTANYL CITRATE 0.05 MG/ML IJ SOLN
INTRAMUSCULAR | Status: DC | PRN
  Administered 2014-02-09: 25 ug via INTRAVENOUS
  Administered 2014-02-09: 50 ug via INTRAVENOUS
  Administered 2014-02-09: 25 ug via INTRAVENOUS

## 2014-02-09 MED ORDER — PROPOFOL 10 MG/ML IV BOLUS
INTRAVENOUS | Status: DC | PRN
  Administered 2014-02-09: 150 mg via INTRAVENOUS

## 2014-02-09 MED ORDER — CIPROFLOXACIN IN D5W 400 MG/200ML IV SOLN
INTRAVENOUS | Status: AC
  Filled 2014-02-09: qty 200

## 2014-02-09 MED ORDER — CIPROFLOXACIN IN D5W 400 MG/200ML IV SOLN
400.0000 mg | INTRAVENOUS | Status: AC
  Administered 2014-02-09: 400 mg via INTRAVENOUS
  Filled 2014-02-09: qty 200

## 2014-02-09 MED ORDER — ACETAMINOPHEN 10 MG/ML IV SOLN
INTRAVENOUS | Status: DC | PRN
  Administered 2014-02-09: 1000 mg via INTRAVENOUS

## 2014-02-09 MED ORDER — DEXAMETHASONE SODIUM PHOSPHATE 4 MG/ML IJ SOLN
INTRAMUSCULAR | Status: DC | PRN
  Administered 2014-02-09: 10 mg via INTRAVENOUS

## 2014-02-09 MED ORDER — STERILE WATER FOR IRRIGATION IR SOLN
Status: DC | PRN
  Administered 2014-02-09: 3000 mL

## 2014-02-09 MED ORDER — MIDAZOLAM HCL 2 MG/2ML IJ SOLN
INTRAMUSCULAR | Status: AC
  Filled 2014-02-09: qty 2

## 2014-02-09 MED ORDER — FENTANYL CITRATE 0.05 MG/ML IJ SOLN
25.0000 ug | INTRAMUSCULAR | Status: DC | PRN
  Filled 2014-02-09: qty 1

## 2014-02-09 MED ORDER — SODIUM CHLORIDE 0.9 % IR SOLN
Status: DC | PRN
  Administered 2014-02-09: 1000 mL via INTRAVESICAL

## 2014-02-09 MED ORDER — MITOMYCIN CHEMO FOR BLADDER INSTILLATION 40 MG: 40.0000 mg | Freq: Once | INTRAVENOUS | Status: DC

## 2014-02-09 MED ORDER — PROMETHAZINE HCL 25 MG/ML IJ SOLN
6.2500 mg | INTRAMUSCULAR | Status: DC | PRN
  Filled 2014-02-09: qty 1

## 2014-02-09 MED ORDER — LIDOCAINE HCL (CARDIAC) 20 MG/ML IV SOLN
INTRAVENOUS | Status: DC | PRN
  Administered 2014-02-09: 80 mg via INTRAVENOUS

## 2014-02-09 MED ORDER — ONDANSETRON HCL 4 MG/2ML IJ SOLN
INTRAMUSCULAR | Status: DC | PRN
  Administered 2014-02-09: 4 mg via INTRAVENOUS

## 2014-02-09 MED ORDER — LACTATED RINGERS IV SOLN
INTRAVENOUS | Status: DC
  Administered 2014-02-09 (×2): via INTRAVENOUS
  Filled 2014-02-09: qty 1000

## 2014-02-09 SURGICAL SUPPLY — 53 items
ADAPTER CATH URET PLST 4-6FR (CATHETERS)
ADPR CATH URET STRL DISP 4-6FR (CATHETERS)
BAG DRAIN URO-CYSTO SKYTR STRL (DRAIN) ×3
BAG DRN UROCATH (DRAIN) ×2
BAG URINE DRAINAGE (UROLOGICAL SUPPLIES) ×3
BAG URO CATCHER STRL LF (DRAPE) ×3
BASKET LASER NITINOL 1.9FR (BASKET)
BASKET STNLS GEMINI 4WIRE 3FR (BASKET)
BASKET ZERO TIP NITINOL 2.4FR (BASKET)
BLADE SURG 15 STRL LF DISP TIS (BLADE)
BLADE SURG 15 STRL SS (BLADE)
BSKT STON RTRVL 120 1.9FR (BASKET)
BSKT STON RTRVL GEM 120X11 3FR (BASKET)
BSKT STON RTRVL ZERO TP 2.4FR (BASKET)
CANISTER SUCT LVC 12 LTR MEDI- (MISCELLANEOUS) ×3
CATH FOLEY 3WAY 30CC 22FR (CATHETERS) ×3
CATH INTERMIT  6FR 70CM (CATHETERS)
CATH URET 5FR 28IN CONE TIP (BALLOONS)
CATH URET 5FR 28IN OPEN ENDED (CATHETERS) ×3
CATH URET 5FR 70CM CONE TIP (BALLOONS)
CATH URET DUAL LUMEN 6-10FR 50 (CATHETERS)
CLOTH BEACON ORANGE TIMEOUT ST (SAFETY) ×3
DRAPE CAMERA CLOSED 9X96 (DRAPES) ×3
DRSG TEGADERM 2-3/8X2-3/4 SM (GAUZE/BANDAGES/DRESSINGS)
ELECT BUTTON HF 24-28F 2 30DE (ELECTRODE) ×3
ELECT HF RESECT BIPO 24F 45 ND (CUTTING LOOP) ×3
ELECT LOOP MED HF 24F 12D (CUTTING LOOP) ×3
ELECT REM PT RETURN 9FT ADLT (ELECTROSURGICAL) ×3
EVACUATOR MICROVAS BLADDER (UROLOGICAL SUPPLIES) ×3
FIBER LASER FLEXIVA 200 (UROLOGICAL SUPPLIES)
FIBER LASER FLEXIVA 365 (UROLOGICAL SUPPLIES)
GLOVE BIO SURGEON STRL SZ7.5 (GLOVE) ×3
GLOVE BIO SURGEON STRL SZ8 (GLOVE) ×3
GLOVE INDICATOR 6.5 STRL GRN (GLOVE) ×3
GLOVE SURG SS PI 6.5 STRL IVOR (GLOVE) ×3
GOWN STRL REUS W/ TWL XL LVL3 (GOWN DISPOSABLE) ×2
GOWN STRL REUS W/TWL XL LVL3 (GOWN DISPOSABLE) ×6
GUIDEWIRE 0.038 PTFE COATED (WIRE)
GUIDEWIRE ANG ZIPWIRE 038X150 (WIRE)
GUIDEWIRE STR DUAL SENSOR (WIRE)
HOLDER FOLEY CATH W/STRAP (MISCELLANEOUS)
IV NS IRRIG 3000ML ARTHROMATIC (IV SOLUTION) ×12
KIT BALLIN UROMAX 15FX10 (LABEL)
KIT BALLN UROMAX 15FX4 (MISCELLANEOUS)
KIT BALLN UROMAX 26 75X4 (MISCELLANEOUS)
NS IRRIG 500ML POUR BTL (IV SOLUTION)
PACK CYSTO (CUSTOM PROCEDURE TRAY) ×3
SET ASPIRATION TUBING (TUBING) ×3
SET HIGH PRES BAL DIL (LABEL)
SHEATH ACCESS URETERAL 38CM (SHEATH)
SHEATH URET ACCESS 12FR/55CM (UROLOGICAL SUPPLIES)
SUT ETHILON 3 0 PS 1 (SUTURE)
SYRINGE 10CC LL (SYRINGE) ×3

## 2014-02-09 NOTE — Anesthesia Procedure Notes (Signed)
Procedure Name: LMA Insertion Date/Time: 02/09/2014 10:50 AM Performed by: Wanita Chamberlain Pre-anesthesia Checklist: Patient identified, Timeout performed, Emergency Drugs available, Suction available and Patient being monitored Patient Re-evaluated:Patient Re-evaluated prior to inductionOxygen Delivery Method: Circle system utilized Preoxygenation: Pre-oxygenation with 100% oxygen Intubation Type: IV induction Ventilation: Mask ventilation without difficulty LMA: LMA inserted LMA Size: 5.0 Number of attempts: 1 Placement Confirmation: positive ETCO2 Tube secured with: Tape Dental Injury: Teeth and Oropharynx as per pre-operative assessment

## 2014-02-09 NOTE — Discharge Instructions (Signed)
Transurethral Resection of Bladder Tumor (TURBT) or Bladder Biopsy   Definition:  Transurethral Resection of the Bladder Tumor is a surgical procedure used to diagnose and remove tumors within the bladder. TURBT is the most common treatment for early stage bladder cancer.  General instructions:     Your recent bladder surgery requires very little post hospital care but some definite precautions.  Despite the fact that no skin incisions were used, the area around the bladder incisions are raw and covered with scabs to promote healing and prevent bleeding. Certain precautions are needed to insure that the scabs are not disturbed over the next 2-4 weeks while the healing proceeds.  Because the raw surface inside your bladder and the irritating effects of urine you may expect frequency of urination and/or urgency (a stronger desire to urinate) and perhaps even getting up at night more often. This will usually resolve or improve slowly over the healing period. You may see some blood in your urine over the first 6 weeks. Do not be alarmed, even if the urine was clear for a while. Get off your feet and drink lots of fluids until clearing occurs. If you start to pass clots or don't improve call us.  Diet:  You may return to your normal diet immediately. Because of the raw surface of your bladder, alcohol, spicy foods, foods high in acid and drinks with caffeine may cause irritation or frequency and should be used in moderation. To keep your urine flowing freely and avoid constipation, drink plenty of fluids during the day (8-10 glasses). Tip: Avoid cranberry juice because it is very acidic.  Activity:  Your physical activity doesn't need to be restricted. However, if you are very active, you may see some blood in the urine. We suggest that you reduce your activity under the circumstances until the bleeding has stopped.  Bowels:  It is important to keep your bowels regular during the postoperative  period. Straining with bowel movements can cause bleeding. A bowel movement every other day is reasonable. Use a mild laxative if needed, such as milk of magnesia 2-3 tablespoons, or 2 Dulcolax tablets. Call if you continue to have problems. If you had been taking narcotics for pain, before, during or after your surgery, you may be constipated. Take a laxative if necessary.    Medication:  You should resume your pre-surgery medications unless told not to. In addition you may be given an antibiotic to prevent or treat infection. Antibiotics are not always necessary. All medication should be taken as prescribed until the bottles are finished unless you are having an unusual reaction to one of the drugs.      Post Anesthesia Home Care Instructions  Activity: Get plenty of rest for the remainder of the day. A responsible adult should stay with you for 24 hours following the procedure.  For the next 24 hours, DO NOT: -Drive a car -Operate machinery -Drink alcoholic beverages -Take any medication unless instructed by your physician -Make any legal decisions or sign important papers.  Meals: Start with liquid foods such as gelatin or soup. Progress to regular foods as tolerated. Avoid greasy, spicy, heavy foods. If nausea and/or vomiting occur, drink only clear liquids until the nausea and/or vomiting subsides. Call your physician if vomiting continues.  Special Instructions/Symptoms: Your throat may feel dry or sore from the anesthesia or the breathing tube placed in your throat during surgery. If this causes discomfort, gargle with warm salt water. The discomfort should disappear within 24 

## 2014-02-09 NOTE — H&P (Signed)
Reason For Visit Gross hematuria   History of Present Illness 68M referred by Dr. Unice Cobble, MD for eval and management of gross hematuria. His PCP is Dr. Daneil Dolin, MD.  Patient's symptoms began approximately 3 weeks ago with gross hematuria with each void. He was having a small amt of dysuria. Was not having flank pain. No progressive voiding symtoms such as frequency/urgency. His culture done by Dr. Linna Darner grew 20K GBS. His BUN/Cr was 14/1.0. A stone protocol CT scan showed no stones of pathology within either kidney (limited by no contast).  The patient states that he has had intermittent gross hematuria. He denies any changes in urinary frequency, urgency, or incontinence. The patient states that he has a weak stream, but feels that he empties his bladder completely. He has noticed some clots passing in his urine. Patient has no history of urinary tract infections. He has never passed a kidney stone. There is no family history of kidney stones or GU malignancies. The patient is a lifelong smoker.   Past Medical History Problems  1. History of diabetes mellitus (Z86.39) 2. History of hyperlipidemia (Z86.39) 3. History of hypertension (Z86.79)  Surgical History Problems  1. History of Neck Surgery  Current Meds 1. Aspirin 81 MG Oral Tablet;  Therapy: (Recorded:05Nov2015) to Recorded 2. Cardizem CD 180 MG Oral Capsule Extended Release 24 Hour;  Therapy: (Recorded:05Nov2015) to Recorded 3. Hytrin 10 MG CAPS;  Therapy: (Recorded:05Nov2015) to Recorded 4. Lipitor 20 MG Oral Tablet;  Therapy: (Recorded:05Nov2015) to Recorded 5. MetFORMIN HCl - 500 MG Oral Tablet;  Therapy: (Recorded:05Nov2015) to Recorded 6. Prinzide 20-25 MG TABS;  Therapy: (Recorded:05Nov2015) to Recorded  Allergies Medication  1. No Known Drug Allergies  Family History Problems  1. Family history of Death of family member : Mother, Father   Mother at age 37; colon cancerFather at age 69; heart  attack 2. Family history of colon cancer (Z80.0) : Mother 3. Family history of myocardial infarction (Z82.49) : Father 4. Family history of tuberculosis (Z55.1) : Mother  Social History Problems    Alcohol use (F10.99)   2 on the weekends   Caffeine use (F15.90)   3 cups of coffee per day   Current every day smoker (F17.200)   1/2 ppd for 35 years   Married   Number of children   1 son and 1 daughter   Retired  Review of Systems  Genitourinary: urinary frequency, dysuria, hematuria and penile pain.  Musculoskeletal: back pain.  Neurological: dizziness.    Vitals Vital Signs [Data Includes: Last 1 Day]  Recorded: [id number] 08:55AM  Height: 5 ft 7 in Weight: 195 lb  BMI Calculated: 30.54 BSA Calculated: 2 Blood Pressure: 163 / 80 Temperature: 98.2 F Heart Rate: 83  Physical Exam Constitutional: Well nourished and well developed . No acute distress.  ENT:. The ears and nose are normal in appearance.  Neck: The appearance of the neck is normal and no neck mass is present.  Pulmonary: No respiratory distress and normal respiratory rhythm and effort.  Cardiovascular: Heart rate and rhythm are normal . No peripheral edema.  Abdomen: The abdomen is soft and nontender. No masses are palpated. No CVA tenderness. No hernias are palpable. No hepatosplenomegaly noted.  Lymphatics: The femoral and inguinal nodes are not enlarged or tender.  Skin: Normal skin turgor, no visible rash and no visible skin lesions.  Neuro/Psych:. Mood and affect are appropriate.    Results/Data Patient had a noncontrast stone protocol CT scan performed on 12/27/13.  I independently reviewed these images, they showed no stones or suspicious renal masses. This is a limited evaluation because there is no contrast.  The patient's urinalysis today demonstrates gross hematuria, urine cultures and sent     Procedure  Procedure: Cystoscopy   Indication: Hematuria.  Informed Consent: from the  patient . Specific risks including, but not limited to bleeding, infection, pain, allergic reaction etc. were explained.  Prep: The patient was prepped with hibiclens.  Anesthesia:. Local anesthesia was administered intraurethrally with 2% lidocaine jelly.  Antibiotic prophylaxis: Ciprofloxacin.  Procedure Note:  Urethral meatus:. No abnormalities.  Anterior urethra: No abnormalities.  Prostatic urethra: No abnormalities.  Bladder: Visulization was obscured due to blood. A solitary tumor was visualized in the bladder. A papillary tumor was seen in the bladder measuring approximately 1 cm in size. This tumor was located near the dome of the bladder.    Assessment Assessed  1. Gross hematuria (R31.0) 2. Bladder tumor (D49.4)  Procedure likely secondary to a small papillary bladder tumor.   Plan Gross hematuria  1. Cysto; Status:Complete;   Done: [id number] 2. URINE CULTURE; Status:Hold For - Specimen/Data Collection,Appointment; Requested  for:05Nov2015;  3. Follow-up Schedule Surgery Office  Follow-up  Status: Hold For - Appointment   Requested for: [id number] Health Maintenance  4. UA With REFLEX; [Do Not Release]; Status:In Progress - Specimen/Data Collected;    Done: [id number]  Discussion/Summary Plan is to take the patient to the operating room for transurethral resection of his bladder tumor, bilateral retrograde pyelograms, and postoperative mitomycin I got over the procedure in detail with the patient. He understands the risk and benefits associated with it. We'll get this set up as soon as possible. I have asked that we get surgery clearance from his primary care provider.

## 2014-02-09 NOTE — Transfer of Care (Signed)
Immediate Anesthesia Transfer of Care Note  Patient: Colin Love  Procedure(s) Performed: Procedure(s): BILATERAL RETROGRADE PYELOGRAM , Cystoscopy (Bilateral) Bladder Biopsies (N/A)  Patient Location: PACU  Anesthesia Type:General  Level of Consciousness: awake, alert , oriented and patient cooperative  Airway & Oxygen Therapy: Patient Spontanous Breathing and Patient connected to nasal cannula oxygen  Post-op Assessment: Report given to PACU RN and Post -op Vital signs reviewed and stable  Post vital signs: Reviewed and stable  Complications: No apparent anesthesia complications

## 2014-02-09 NOTE — Op Note (Signed)
Preoperative diagnosis:  1. Small bladder mass left bladder dome   Postoperative diagnosis:  1. 1 cm bladder mass in the left bladder dome  2. Flat carpet-like bladder mucosal lesion left lateral wall posterior to the ureteral orifice  Procedure: 1. TURBT, multiple overlapping sites, proximally 2 cm in size 2. Bilateral retrograde pyelograms with interpretation 3. Insertion of intravesical mitomycin C  Surgeon: Ardis Hughs, MD  Anesthesia: General  Complications: None  Intraoperative findings: Retro-pyelograms bilaterally showed a normal caliber ureter with no filling defects, the upper urinary tracts were normal with no appreciable filling defects or abnormalities. There was a small approximately 1 cm papillary-like tumor at the left bladder dome. There was additional mucosal abnormality, carpet-like appearing, concerning for CIF, located in the left lateral wall just posterior to the ureteral orifice.  EBL: Minimal  Specimens: #1 left lateral wall biopsy #2 tumor left bladder dome #3 tumor base left bladder dome  Indication: Colin Love is a 68 y.o. patient who presented with gross hematuria. His evaluation revealed a small papillary tumor in the dome of his bladder.  After reviewing the management options for treatment, he elected to proceed with the above surgical procedure(s). We have discussed the potential benefits and risks of the procedure, side effects of the proposed treatment, the likelihood of the patient achieving the goals of the procedure, and any potential problems that might occur during the procedure or recuperation. Informed consent has been obtained.  Description of procedure:  The patient was taken to the operating room and general anesthesia was induced.  The patient was placed in the dorsal lithotomy position, prepped and draped in the usual sterile fashion, and preoperative antibiotics were administered. A preoperative time-out was performed.   A  12.5 22 French rigid cystoscope was gently passed to the patient's urethra and into the bladder. A 12.5 lens was exchanged for the 70 lens and a 360 cystoscopic evaluation was performed. The above findings were noted. I then exchanged the 70 lens for a 12.5 lens and using a 5 [place] open-ended ureteral catheter inserted into the left ureteral orifice and injected 7 mL of Omnipaque contrast into the collecting system performing a retrograde pyelogram on the left. The findings were noted as above. I then repeated this procedure on the right side and cannulated the right ureteral orifice performing a retrograde in a similar fashion. The above findings are again noted. I then biopsied the left lateral wall using cold cup bladder biopsy forceps. I then remove the tumor on the left bladder dome using several bites of the cold cup forceps. I then sent a separate bladder tumor base specimen. Using the Bugbee cautery I then cauterized around the resection sites insuring adequate hemostasis. I then emptied the bladder and performed an exam under anesthesia which revealed prominent prostatic ridges bilaterally with no palpable masses within the bladder. An 24 French Foley catheter was then inserted into the patient's bladder. The patient was subsequently extubated and returned the PACU in excellent condition.  Once the patient was in recovery, I instilled 40 mL of 40 mg of mitomycin C into the patient's bladder. The Foley catheter was then capped. The mitomycin C will be left in the patient's bladder for 90-120 minutes. At that point the catheter will be drained and the catheter removed. Ardis Hughs, M.D.

## 2014-02-09 NOTE — Anesthesia Preprocedure Evaluation (Addendum)
Anesthesia Evaluation  Patient identified by MRN, date of birth, ID band Patient awake    Reviewed: Allergy & Precautions, H&P , NPO status , Patient's Chart, lab work & pertinent test results  Airway Mallampati: II  TM Distance: >3 FB Neck ROM: Full    Dental  (+) Lower Dentures, Upper Dentures   Pulmonary Current Smoker,  breath sounds clear to auscultation  Pulmonary exam normal       Cardiovascular Exercise Tolerance: Good hypertension, Pt. on medications Rhythm:Irregular Rate:Normal  Recently evaluated by Dr. Marlou Porch for PAF. In sinus rhythm on ECG.     Neuro/Psych S/P 3 level ACDF 2006 negative neurological ROS  negative psych ROS   GI/Hepatic Neg liver ROS, GERD-  ,  Endo/Other  diabetes, Type 2, Oral Hypoglycemic Agents  Renal/GU negative Renal ROS  negative genitourinary   Musculoskeletal negative musculoskeletal ROS (+)   Abdominal (+) + obese,   Peds negative pediatric ROS (+)  Hematology negative hematology ROS (+)   Anesthesia Other Findings   Reproductive/Obstetrics negative OB ROS                         Anesthesia Physical Anesthesia Plan  ASA: III  Anesthesia Plan: General   Post-op Pain Management:    Induction: Intravenous  Airway Management Planned: LMA  Additional Equipment:   Intra-op Plan:   Post-operative Plan: Extubation in OR  Informed Consent: I have reviewed the patients History and Physical, chart, labs and discussed the procedure including the risks, benefits and alternatives for the proposed anesthesia with the patient or authorized representative who has indicated his/her understanding and acceptance.   Dental advisory given  Plan Discussed with: CRNA  Anesthesia Plan Comments:         Anesthesia Quick Evaluation

## 2014-02-09 NOTE — Anesthesia Postprocedure Evaluation (Signed)
  Anesthesia Post-op Note  Patient: Colin Love  Procedure(s) Performed: Procedure(s) (LRB): BILATERAL RETROGRADE PYELOGRAM , Cystoscopy (Bilateral) Bladder Biopsies (N/A)  Patient Location: PACU  Anesthesia Type: General  Level of Consciousness: awake and alert   Airway and Oxygen Therapy: Patient Spontanous Breathing  Post-op Pain: mild  Post-op Assessment: Post-op Vital signs reviewed, Patient's Cardiovascular Status Stable, Respiratory Function Stable, Patent Airway and No signs of Nausea or vomiting  Last Vitals: 116/79, 67, 19   Post-op Vital Signs: stable   Complications: No apparent anesthesia complications

## 2014-02-09 NOTE — Interval H&P Note (Signed)
History and Physical Interval Note:  02/09/2014 9:37 AM  Colin Love  has presented today for surgery, with the diagnosis of bladder mass  The various methods of treatment have been discussed with the patient and family. After consideration of risks, benefits and other options for treatment, the patient has consented to  Procedure(s): BILATERAL RETROGRADE PYELOGRAM WITH MITOMYCIN C (Bilateral) TRANSURETHRAL RESECTION OF BLADDER TUMOR WITH GYRUS (TURBT-GYRUS) (N/A) as a surgical intervention .  The patient's history has been reviewed, patient examined, no change in status, stable for surgery.  I have reviewed the patient's chart and labs.  Questions were answered to the patient's satisfaction.     Louis Meckel W

## 2014-02-12 ENCOUNTER — Encounter (HOSPITAL_BASED_OUTPATIENT_CLINIC_OR_DEPARTMENT_OTHER): Payer: Self-pay | Admitting: Urology

## 2014-02-12 LAB — POCT I-STAT 4, (NA,K, GLUC, HGB,HCT)
Glucose, Bld: 138 mg/dL — ABNORMAL HIGH (ref 70–99)
HCT: 46 % (ref 39.0–52.0)
Hemoglobin: 15.6 g/dL (ref 13.0–17.0)
Potassium: 3.7 meq/L (ref 3.7–5.3)
Sodium: 140 meq/L (ref 137–147)

## 2014-04-13 LAB — HM DIABETES EYE EXAM

## 2014-04-26 ENCOUNTER — Encounter: Payer: Self-pay | Admitting: Internal Medicine

## 2014-07-10 ENCOUNTER — Ambulatory Visit: Payer: Commercial Managed Care - HMO | Admitting: Internal Medicine

## 2014-11-13 ENCOUNTER — Other Ambulatory Visit (INDEPENDENT_AMBULATORY_CARE_PROVIDER_SITE_OTHER): Payer: Commercial Managed Care - HMO

## 2014-11-13 ENCOUNTER — Encounter: Payer: Self-pay | Admitting: Internal Medicine

## 2014-11-13 ENCOUNTER — Other Ambulatory Visit: Payer: Medicare HMO

## 2014-11-13 ENCOUNTER — Ambulatory Visit (INDEPENDENT_AMBULATORY_CARE_PROVIDER_SITE_OTHER): Payer: Commercial Managed Care - HMO | Admitting: Internal Medicine

## 2014-11-13 VITALS — BP 130/70 | HR 60 | Temp 97.9°F | Ht 67.0 in | Wt 188.0 lb

## 2014-11-13 DIAGNOSIS — Z720 Tobacco use: Secondary | ICD-10-CM | POA: Diagnosis not present

## 2014-11-13 DIAGNOSIS — R208 Other disturbances of skin sensation: Secondary | ICD-10-CM

## 2014-11-13 DIAGNOSIS — E785 Hyperlipidemia, unspecified: Secondary | ICD-10-CM

## 2014-11-13 DIAGNOSIS — Z23 Encounter for immunization: Secondary | ICD-10-CM

## 2014-11-13 DIAGNOSIS — F17200 Nicotine dependence, unspecified, uncomplicated: Secondary | ICD-10-CM

## 2014-11-13 DIAGNOSIS — Z1159 Encounter for screening for other viral diseases: Secondary | ICD-10-CM

## 2014-11-13 DIAGNOSIS — I10 Essential (primary) hypertension: Secondary | ICD-10-CM

## 2014-11-13 DIAGNOSIS — E119 Type 2 diabetes mellitus without complications: Secondary | ICD-10-CM

## 2014-11-13 DIAGNOSIS — R200 Anesthesia of skin: Secondary | ICD-10-CM

## 2014-11-13 DIAGNOSIS — C671 Malignant neoplasm of dome of bladder: Secondary | ICD-10-CM

## 2014-11-13 LAB — LIPID PANEL
Cholesterol: 118 mg/dL (ref 0–200)
HDL: 48.3 mg/dL
LDL Cholesterol: 51 mg/dL (ref 0–99)
NonHDL: 70
Total CHOL/HDL Ratio: 2
Triglycerides: 93 mg/dL (ref 0.0–149.0)
VLDL: 18.6 mg/dL (ref 0.0–40.0)

## 2014-11-13 LAB — MICROALBUMIN / CREATININE URINE RATIO
CREATININE, U: 183.3 mg/dL
MICROALB/CREAT RATIO: 6.1 mg/g (ref 0.0–30.0)
Microalb, Ur: 11.2 mg/dL — ABNORMAL HIGH (ref 0.0–1.9)

## 2014-11-13 LAB — TSH: TSH: 0.94 u[IU]/mL (ref 0.35–4.50)

## 2014-11-13 LAB — HEMOGLOBIN A1C: Hgb A1c MFr Bld: 6.4 % (ref 4.6–6.5)

## 2014-11-13 LAB — VITAMIN B12: Vitamin B-12: 469 pg/mL (ref 211–911)

## 2014-11-13 MED ORDER — LISINOPRIL 40 MG PO TABS: 40.0000 mg | ORAL_TABLET | Freq: Every day | ORAL | Status: DC

## 2014-11-13 MED ORDER — DILTIAZEM HCL ER COATED BEADS 180 MG PO CP24: 180.0000 mg | ORAL_CAPSULE | Freq: Two times a day (BID) | ORAL | Status: DC

## 2014-11-13 MED ORDER — METFORMIN HCL 500 MG PO TABS: 500.0000 mg | ORAL_TABLET | Freq: Two times a day (BID) | ORAL | Status: DC

## 2014-11-13 MED ORDER — BUPROPION HCL ER (SMOKING DET) 150 MG PO TB12: 150.0000 mg | ORAL_TABLET | Freq: Two times a day (BID) | ORAL | Status: DC

## 2014-11-13 MED ORDER — TERAZOSIN HCL 10 MG PO CAPS: 10.0000 mg | ORAL_CAPSULE | Freq: Every day | ORAL | Status: DC

## 2014-11-13 MED ORDER — ATORVASTATIN CALCIUM 20 MG PO TABS: 20.0000 mg | ORAL_TABLET | Freq: Every day | ORAL | Status: DC

## 2014-11-13 NOTE — Patient Instructions (Signed)
It was good to see you today.  We have reviewed your prior records including labs and tests today  Flu shot today. We'll do Prevnar 13 next visit  Test(s) ordered today. Your results will be released to [facility] (or called to you) after review, usually within 72hours after test completion. If any changes need to be made, you will be notified at that same time.  Medications reviewed and updated Okay to resume generic Zyban to help you quit smoking. I wish you best of luck. No other medication changes recommended at this time. Refill on medication(s) as discussed today.  Please schedule followup in 6 months, call sooner if problems.

## 2014-11-13 NOTE — Assessment & Plan Note (Signed)
On statin since 2009 - LDL at goal, check annually The current medical regimen is effective;  continue present plan and medications.

## 2014-11-13 NOTE — Progress Notes (Signed)
Subjective:    Patient ID: Colin Love, male    DOB: 27-Jun-1945, 69 y.o.   MRN: [id number]  HPI  Patient here for follow-up. Reviewed chronic issues, interval events and current concerns  Past Medical History  Diagnosis Date  . Hypertension   . Hyperlipidemia   . Type 2 diabetes mellitus   . History of diverticulitis of colon   . Bladder cancer 02/2014    local, low grade pap uro carcinoma  . History of adenomatous polyp of colon   . GERD (gastroesophageal reflux disease)   . Diverticulosis of colon   . Adenoma of left adrenal gland   . BPH (benign prostatic hypertrophy)   . PAC (premature atrial contraction)   . Full dentures   . At risk for sleep apnea     STOP-BANG= 4    SENT TO PCP 02-06-2014    Review of Systems  Constitutional: Negative for fatigue and unexpected weight change.  Respiratory: Negative for cough and shortness of breath.   Cardiovascular: Negative for chest pain and leg swelling.  Genitourinary: Negative for hematuria.  Musculoskeletal: Negative for back pain, joint swelling and arthralgias.  Neurological: Positive for numbness (R>L foot "occ, only when I am excitied" - no weakness or injury). Negative for weakness.       Objective:    Physical Exam  Constitutional: He appears well-developed and well-nourished. No distress.  Cardiovascular: Normal rate, regular rhythm and normal heart sounds.   No murmur heard. Pulmonary/Chest: Effort normal and breath sounds normal. No respiratory distress.    BP 130/70 mmHg  Pulse 60  Temp(Src) 97.9 F (36.6 C) (Oral)  Ht 5\' 7"  (1.702 m)  Wt 188 lb (85.276 kg)  BMI 29.44 kg/m2  SpO2 97% Wt Readings from Last 3 Encounters:  11/13/14 188 lb (85.276 kg)  02/09/14 195 lb (88.451 kg)  01/24/14 195 lb (88.451 kg)     Lab Results  Component Value Date   WBC 9.0 12/26/2013   HGB 15.6 02/09/2014   HCT 46.0 02/09/2014   PLT 167.0 12/26/2013   GLUCOSE 138* 02/09/2014   CHOL 120 01/15/2014   TRIG  80.0 01/15/2014   HDL 38.30* 01/15/2014   LDLCALC 66 01/15/2014   ALT 17 03/17/2012   AST 22 03/17/2012   NA 140 02/09/2014   K 3.7 02/09/2014   CL 102 12/26/2013   CREATININE 1.0 12/26/2013   BUN 14 12/26/2013   CO2 25 12/26/2013   TSH 0.21* 12/26/2013   HGBA1C 6.5 12/27/2013   MICROALBUR 66.9* 01/15/2014    No results found.     Assessment & Plan:   Numbness with excitement. May be related to adrenaline. Check labs to exclude metabolic problem such as worsening diabetic neuropathy, B-12 her thyroid issue. No neuro evidence for back issues. Patient reports symptomatic not significant enough to warrant further evaluation if labs unremarkable. Patient will keep his notified of progressive or worsening symptoms  Problem List Items Addressed This Visit    Bladder cancer    Episode gross hematuria mid Oct 2015 reviewed with resolved spont over 30 d time Subsequently diagnosed with low-grade bladder cancer on cystoscopy by urology December 2015 Ongoing semiannual maintenance for follow-up of same Patient committed to quitting smoking at this time No recurrence of blood in urine since then      Diabetes type 2, controlled - Primary    Home cbgs <120 per report The current medical regimen is effective;  continue present plan and medications.  On  statin, ACEI and ASA Check a1c q6 mo, titrate as needed  Lab Results  Component Value Date   HGBA1C 6.5 12/27/2013        Relevant Medications   lisinopril (PRINIVIL,ZESTRIL) 40 MG tablet   atorvastatin (LIPITOR) 20 MG tablet   metFORMIN (GLUCOPHAGE) 500 MG tablet   Other Relevant Orders   Hemoglobin A1c   Microalbumin / creatinine urine ratio   Lipid panel   Dyslipidemia    On statin since 2009 - LDL at goal, check annually The current medical regimen is effective;  continue present plan and medications.      Relevant Medications   atorvastatin (LIPITOR) 20 MG tablet   Essential hypertension    The current medical regimen  is effective;  continue present plan and medications.   BP Readings from Last 3 Encounters:  11/13/14 130/70  02/09/14 134/65  01/24/14 124/82        Relevant Medications   lisinopril (PRINIVIL,ZESTRIL) 40 MG tablet   atorvastatin (LIPITOR) 20 MG tablet   diltiazem (CARDIZEM CD) 180 MG 24 hr capsule   terazosin (HYTRIN) 10 MG capsule   Smoker    5 minutes today spent counseling patient on unhealthy effects of continued tobacco abuse and encouragement of cessation including medical options available to help the patient quit smoking. tried Nicorette gum 08/2011 -  Will re-try zyban now as used same previously in new year 2015 and 2013 - reviewed prior "cold [place]" efforts New Year 2014 were ineffective -       Other Visit Diagnoses    Numbness of foot        Relevant Orders    Hemoglobin A1c    Vitamin B12    TSH        Gwendolyn Grant, MD

## 2014-11-13 NOTE — Assessment & Plan Note (Signed)
5 minutes today spent counseling patient on unhealthy effects of continued tobacco abuse and encouragement of cessation including medical options available to help the patient quit smoking. tried Nicorette gum 08/2011 -  Will re-try zyban now as used same previously in new year 2015 and 2013 - reviewed prior "cold [place]" efforts New Year 2014 were ineffective -

## 2014-11-13 NOTE — Assessment & Plan Note (Signed)
Episode gross hematuria mid Oct 2015 reviewed with resolved spont over 30 d time Subsequently diagnosed with low-grade bladder cancer on cystoscopy by urology December 2015 Ongoing semiannual maintenance for follow-up of same Patient committed to quitting smoking at this time No recurrence of blood in urine since then

## 2014-11-13 NOTE — Assessment & Plan Note (Signed)
The current medical regimen is effective;  continue present plan and medications.   BP Readings from Last 3 Encounters:  11/13/14 130/70  02/09/14 134/65  01/24/14 124/82

## 2014-11-13 NOTE — Assessment & Plan Note (Signed)
Home cbgs <120 per report The current medical regimen is effective;  continue present plan and medications.  On statin, ACEI and ASA Check a1c q6 mo, titrate as needed  Lab Results  Component Value Date   HGBA1C 6.5 12/27/2013

## 2014-11-13 NOTE — Progress Notes (Signed)
Pre visit review using our clinic review tool, if applicable. No additional management support is needed unless otherwise documented below in the visit note. 

## 2014-11-14 LAB — HEPATITIS C ANTIBODY: HCV Ab: NEGATIVE

## 2015-01-16 ENCOUNTER — Other Ambulatory Visit: Payer: Self-pay | Admitting: Internal Medicine

## 2015-04-15 LAB — HM DIABETES EYE EXAM

## 2015-04-25 ENCOUNTER — Encounter: Payer: Self-pay | Admitting: Internal Medicine

## 2015-05-13 ENCOUNTER — Ambulatory Visit: Payer: Medicare HMO | Admitting: Internal Medicine

## 2015-05-21 ENCOUNTER — Ambulatory Visit (INDEPENDENT_AMBULATORY_CARE_PROVIDER_SITE_OTHER): Payer: Commercial Managed Care - HMO | Admitting: Internal Medicine

## 2015-05-21 ENCOUNTER — Other Ambulatory Visit (INDEPENDENT_AMBULATORY_CARE_PROVIDER_SITE_OTHER): Payer: Commercial Managed Care - HMO

## 2015-05-21 ENCOUNTER — Encounter: Payer: Self-pay | Admitting: Internal Medicine

## 2015-05-21 VITALS — BP 120/74 | HR 62 | Temp 98.4°F | Resp 16 | Wt 195.0 lb

## 2015-05-21 DIAGNOSIS — C671 Malignant neoplasm of dome of bladder: Secondary | ICD-10-CM

## 2015-05-21 DIAGNOSIS — F17200 Nicotine dependence, unspecified, uncomplicated: Secondary | ICD-10-CM

## 2015-05-21 DIAGNOSIS — M79674 Pain in right toe(s): Secondary | ICD-10-CM

## 2015-05-21 DIAGNOSIS — E785 Hyperlipidemia, unspecified: Secondary | ICD-10-CM

## 2015-05-21 DIAGNOSIS — H409 Unspecified glaucoma: Secondary | ICD-10-CM

## 2015-05-21 DIAGNOSIS — E119 Type 2 diabetes mellitus without complications: Secondary | ICD-10-CM | POA: Diagnosis not present

## 2015-05-21 DIAGNOSIS — I10 Essential (primary) hypertension: Secondary | ICD-10-CM

## 2015-05-21 DIAGNOSIS — Z720 Tobacco use: Secondary | ICD-10-CM

## 2015-05-21 LAB — COMPREHENSIVE METABOLIC PANEL WITH GFR
ALT: 11 U/L (ref 0–53)
AST: 14 U/L (ref 0–37)
Albumin: 4.3 g/dL (ref 3.5–5.2)
Alkaline Phosphatase: 64 U/L (ref 39–117)
BUN: 14 mg/dL (ref 6–23)
CO2: 26 meq/L (ref 19–32)
Calcium: 10 mg/dL (ref 8.4–10.5)
Chloride: 103 meq/L (ref 96–112)
Creatinine, Ser: 0.91 mg/dL (ref 0.40–1.50)
GFR: 105.79 mL/min
Glucose, Bld: 130 mg/dL — ABNORMAL HIGH (ref 70–99)
Potassium: 3.6 meq/L (ref 3.5–5.1)
Sodium: 140 meq/L (ref 135–145)
Total Bilirubin: 0.5 mg/dL (ref 0.2–1.2)
Total Protein: 7.2 g/dL (ref 6.0–8.3)

## 2015-05-21 LAB — LIPID PANEL
Cholesterol: 130 mg/dL (ref 0–200)
HDL: 52.6 mg/dL
LDL Cholesterol: 63 mg/dL (ref 0–99)
NonHDL: 77.57
Total CHOL/HDL Ratio: 2
Triglycerides: 73 mg/dL (ref 0.0–149.0)
VLDL: 14.6 mg/dL (ref 0.0–40.0)

## 2015-05-21 LAB — HEMOGLOBIN A1C: Hgb A1c MFr Bld: 6.7 % — ABNORMAL HIGH (ref 4.6–6.5)

## 2015-05-21 MED ORDER — BUPROPION HCL ER (SMOKING DET) 150 MG PO TB12: 150.0000 mg | ORAL_TABLET | Freq: Two times a day (BID) | ORAL | Status: DC

## 2015-05-21 MED ORDER — BLOOD GLUCOSE MONITOR KIT: PACK | Status: DC

## 2015-05-21 NOTE — Assessment & Plan Note (Signed)
BP well controlled Current regimen effective and well tolerated Continue current medications at current doses  

## 2015-05-21 NOTE — Progress Notes (Signed)
Pre visit review using our clinic review tool, if applicable. No additional management support is needed unless otherwise documented below in the visit note. 

## 2015-05-21 NOTE — Assessment & Plan Note (Addendum)
Lipid panel 6 months ago well controlled Continue current dose of lipitor Check lipid, cmp

## 2015-05-21 NOTE — Progress Notes (Signed)
Subjective:    Patient ID: Colin Love, male    DOB: 09/06/1945, 69 y.o.   MRN: [id number]  HPI He is here to establish with a new pcp.  He is here for follow up.  Hypertension: He is taking his medication daily. He is compliant with a low sodium diet.  He denies chest pain, palpitations, edema, shortness of breath and regular headaches. He is not exercising regularly.  He does not monitor his blood pressure at home.      Diabetes: He is taking his medication daily as prescribed. He is compliant with a diabetic diet. He is not exercising regularly.  He checks his feet daily and denies foot lesions. He is up-to-date with an ophthalmology examination.   Hyperlipidemia: He is taking his medication daily. He is compliant with a low fat/cholesterol diet. He is not exercising regularly. He denies myalgias.   Smoking:  He had zyban prescribed last fall, but the pharmacy apparently never got the script.   He did not take it.  He has taken it in the past.  He typically smokes 1ppd.    Between his right 4th and 5th toes he has dryness.  It itches on occasion.  There is a occasional burning sensation. He uses a diabetic lotion.  He denies this sensation in the other toes or foot.    Lump in neck:   He has a lump in the left neck.  He thinks it has grown over the years.  It does not hurt.    Medications and allergies reviewed with patient and updated if appropriate.  Patient Active Problem List   Diagnosis Date Noted  . Premature atrial complexes 01/24/2014  . Bladder cancer ([place]) 01/09/2014  . Smoker   . Diabetes type 2, controlled ([place]) 12/23/2009  . Dyslipidemia 12/23/2009  . Essential hypertension 12/23/2009  . COLONIC POLYPS, HX OF 12/23/2009  . DIVERTICULITIS, HX OF 12/23/2009    Current Outpatient Prescriptions on File Prior to Visit  Medication Sig Dispense Refill  . aspirin 81 MG tablet Take 81 mg by mouth daily.      Marland Kitchen atorvastatin (LIPITOR) 20 MG tablet Take 1 tablet (20  mg total) by mouth daily at 6 PM. 90 tablet 3  . buPROPion (ZYBAN) 150 MG 12 hr tablet Take 1 tablet (150 mg total) by mouth 2 (two) times daily. 60 tablet 3  . diltiazem (CARDIZEM CD) 180 MG 24 hr capsule Take 1 capsule (180 mg total) by mouth 2 (two) times daily. 180 capsule 3  . lisinopril-hydrochlorothiazide (PRINZIDE,ZESTORETIC) 20-12.5 MG tablet Take 1 tablet by mouth 2 (two) times daily. 180 tablet 3  . metFORMIN (GLUCOPHAGE) 500 MG tablet Take 1 tablet (500 mg total) by mouth 2 (two) times daily with a meal. 180 tablet 3  . terazosin (HYTRIN) 10 MG capsule Take 1 capsule (10 mg total) by mouth at bedtime. 90 capsule 3  . lisinopril (PRINIVIL,ZESTRIL) 40 MG tablet Take 1 tablet (40 mg total) by mouth daily. (Patient not taking: Reported on 05/21/2015) 90 tablet 3   No current facility-administered medications on file prior to visit.    Past Medical History  Diagnosis Date  . Hypertension   . Hyperlipidemia   . Type 2 diabetes mellitus   . History of diverticulitis of colon   . Bladder cancer 02/2014    local, low grade pap uro carcinoma  . History of adenomatous polyp of colon   . GERD (gastroesophageal reflux disease)   . Diverticulosis  of colon   . Adenoma of left adrenal gland   . BPH (benign prostatic hypertrophy)   . PAC (premature atrial contraction)   . Full dentures   . At risk for sleep apnea     STOP-BANG= 4    SENT TO PCP 02-06-2014    Past Surgical History  Procedure Laterality Date  . Anterior cervical decomp/discectomy fusion  05-24-2003    C4  --  C7  . Colonoscopy w/ polypectomy  last one 07-24-2013 w/ EGD  . Cystoscopy w/ retrogrades Bilateral 02/09/2014    Procedure: BILATERAL RETROGRADE PYELOGRAM , Cystoscopy;  Surgeon: Ardis Hughs, MD;  Location: [facility];  Service: Urology;  Laterality: Bilateral;  . Transurethral resection of bladder tumor with gyrus (turbt-gyrus) N/A 02/09/2014    Procedure: Bladder Biopsies;  Surgeon: Ardis Hughs, MD;  Location: [facility];  Service: Urology;  Laterality: N/A;    Social History   Social History  . Marital Status: Married    Spouse Name: N/A  . Number of Children: N/A  . Years of Education: N/A   Social History Main Topics  . Smoking status: Current Every Day Smoker -- 0.50 packs/day for 40 years    Types: Cigarettes  . Smokeless tobacco: Never Used  . Alcohol Use: Yes     Comment: OCCASIONAL  . Drug Use: No  . Sexual Activity: Not on file   Other Topics Concern  . Not on file   Social History Narrative    Family History  Problem Relation Age of Onset  . Colon cancer Mother   . Hypertension Mother   . Hyperlipidemia Mother   . Hypertension Father   . Hyperlipidemia Father   . Diabetes Brother     Review of Systems  Constitutional: Negative for fever.  Respiratory: Negative for cough, shortness of breath and wheezing.   Cardiovascular: Negative for chest pain, palpitations and leg swelling.  Gastrointestinal: Negative for nausea and abdominal pain.       Occ GERD - less than once a week  Neurological: Negative for dizziness, light-headedness and headaches.       Objective:   Filed Vitals:   05/21/15 0758  BP: 120/74  Pulse: 62  Temp: 98.4 F (36.9 C)  Resp: 16   Filed Weights   05/21/15 0758  Weight: 195 lb (88.451 kg)   Body mass index is 30.53 kg/(m^2).   Physical Exam Constitutional: Appears well-developed and well-nourished. No distress.  Neck: lipoma or cyst left neck - mobile, nontender, Neck supple. No tracheal deviation present. No thyromegaly present.  No carotid bruit. No cervical adenopathy.   Cardiovascular: Normal rate, regular rhythm and normal heart sounds.   No murmur heard.  No edema Pulmonary/Chest: Effort normal and breath sounds normal. No respiratory distress. No wheezes.  Right foot: no lesion, no foot dryness or redness, normal sensation, 2+ DP pulse     Assessment & Plan:   Foot exam is  normal - no evidence of dryness or infection Will refer to podiatry Sensation may be nerve pain/sensation  Lump in neck Lipoma vs cyst Likely benign May see ENT for removal - he will let me know if he wants a referral  He is currently smoking cigarettes, about 1ppd.  I have stressed the importance of quitting to help prevent several diseases.  He is interested in quitting.  We spent 3 minutes discussing changing behaviors/habits and tools available to assist in quitting, including nicotine replacement (patches, gum,  lozenges), e-cigarettes, vapor, medications.  He agrees to try the wellbutrin again and may try the patches.  He will follow up at their next appointment or sooner to follow up.   See Problem List for Assessment and Plan of chronic medical problems.   Follow up in 6 months Follow up in 6 months

## 2015-05-21 NOTE — Assessment & Plan Note (Addendum)
Lab Results  Component Value Date   HGBA1C 6.4 11/13/2014   Continue current dose of metformin Check a1c Start exercise

## 2015-05-21 NOTE — Assessment & Plan Note (Signed)
Stressed smoking cessation wellbutrin prescribed May try nicotine patches

## 2015-05-21 NOTE — Patient Instructions (Addendum)
   Test(s) ordered today. Your results will be released to [place] (or called to you) after review, usually within 72hours after test completion. If any changes need to be made, you will be notified at that same time.  All other Health Maintenance issues reviewed.   All recommended immunizations and age-appropriate screenings are up-to-date/discussed.  No immunizations administered today.   Medications reviewed and updated.  No changes recommended at this time.  Your prescription(s) have been submitted to your pharmacy. Please take as directed and contact our office if you believe you are having problem(s) with the medication(s).  A referral was ordered for podiatry.    Please followup in 6 months

## 2015-05-24 ENCOUNTER — Ambulatory Visit (INDEPENDENT_AMBULATORY_CARE_PROVIDER_SITE_OTHER): Payer: Commercial Managed Care - HMO | Admitting: Nurse Practitioner

## 2015-05-24 ENCOUNTER — Encounter: Payer: Self-pay | Admitting: Nurse Practitioner

## 2015-05-24 VITALS — BP 130/80 | HR 71 | Temp 97.9°F | Ht 67.0 in | Wt 193.0 lb

## 2015-05-24 DIAGNOSIS — S46819A Strain of other muscles, fascia and tendons at shoulder and upper arm level, unspecified arm, initial encounter: Secondary | ICD-10-CM | POA: Diagnosis not present

## 2015-05-24 MED ORDER — CYCLOBENZAPRINE HCL 5 MG PO TABS: 5.0000 mg | ORAL_TABLET | Freq: Every day | ORAL | Status: DC

## 2015-05-24 MED ORDER — MELOXICAM 15 MG PO TABS: 15.0000 mg | ORAL_TABLET | Freq: Every day | ORAL | Status: DC

## 2015-05-24 NOTE — Patient Instructions (Signed)
Mobic 1 tablet in the morning with food- for inflammation   Flexeril- Muscle relaxer- take a night time before bed  Give it a few days and you can use some ice back there, too.

## 2015-05-24 NOTE — Progress Notes (Signed)
Patient ID: Colin Love, male    DOB: [birth year]  Age: 70 y.o. MRN: [id number]  CC: Neck Pain   HPI Colin Love presents for CC of neck pain x 3 days. He is accompanied by his wife today.  1) Pt had an ACDF procedure of cervical spondylitic myelopathy by Dr. Ronnald Ramp in 05/2003. C 4-5 C 5-6 and C6-7 were decompressed and fused with a 3 level plate.   Pt was reported to have right hand numbness and neck pain with hyperreflexia and mild spasticity. (2005 report)   Onset- Tuesday night  Location- posterior neck and trap muscles Duration - Intermittent  Characteristics- numbness of right arm- stable, aching Aggravating factors- turning over in bed, lying sitting, turning head side to side  Relieving factors- resting  Severity- 9-10/10 worst  Denies trauma  Denies fine motor movement concerns (using keys, writing ect...) Denies radiation of pain   Treatment to date:  ASA- last night, not helpful   History Colin Love has a past medical history of Hypertension; Hyperlipidemia; Type 2 diabetes mellitus ([place]); History of diverticulitis of colon; Bladder cancer ([place]) (02/2014); History of adenomatous polyp of colon; GERD (gastroesophageal reflux disease); Diverticulosis of colon; Adenoma of left adrenal gland; BPH (benign prostatic hypertrophy); PAC (premature atrial contraction); Full dentures; and At risk for sleep apnea.   He has past surgical history that includes Anterior cervical decomp/discectomy fusion (05-24-2003); Colonoscopy w/ polypectomy (last one 07-24-2013 w/ EGD); Cystoscopy w/ retrogrades (Bilateral, 02/09/2014); and Transurethral resection of bladder tumor with gyrus (turbt-gyrus) (N/A, 02/09/2014).   His family history includes Colon cancer in his mother; Diabetes in his brother; Hyperlipidemia in his father and mother; Hypertension in his father and mother.He reports that he has been smoking Cigarettes.  He has a 20 pack-year smoking history. He has never used smokeless  tobacco. He reports that he drinks alcohol. He reports that he does not use illicit drugs.  Outpatient Prescriptions Prior to Visit  Medication Sig Dispense Refill  . aspirin 81 MG tablet Take 81 mg by mouth daily.      Marland Kitchen atorvastatin (LIPITOR) 20 MG tablet Take 1 tablet (20 mg total) by mouth daily at 6 PM. 90 tablet 3  . blood glucose meter kit and supplies KIT Dispense based on patient and insurance preference. Use up to four times daily as directed. (FOR ICD-9 250.00, 250.01). 1 each 0  . buPROPion (ZYBAN) 150 MG 12 hr tablet Take 1 tablet (150 mg total) by mouth 2 (two) times daily. 60 tablet 3  . diltiazem (CARDIZEM CD) 180 MG 24 hr capsule Take 1 capsule (180 mg total) by mouth 2 (two) times daily. 180 capsule 3  . lisinopril (PRINIVIL,ZESTRIL) 40 MG tablet Take 1 tablet (40 mg total) by mouth daily. 90 tablet 3  . lisinopril-hydrochlorothiazide (PRINZIDE,ZESTORETIC) 20-12.5 MG tablet Take 1 tablet by mouth 2 (two) times daily. 180 tablet 3  . metFORMIN (GLUCOPHAGE) 500 MG tablet Take 1 tablet (500 mg total) by mouth 2 (two) times daily with a meal. 180 tablet 3  . terazosin (HYTRIN) 10 MG capsule Take 1 capsule (10 mg total) by mouth at bedtime. 90 capsule 3   No facility-administered medications prior to visit.    ROS Review of Systems  Constitutional: Negative for fever, chills, diaphoresis and fatigue.  Musculoskeletal: Positive for myalgias and neck pain. Negative for back pain, joint swelling, arthralgias, gait problem and neck stiffness.    Objective:  BP 130/80 mmHg  Pulse 71  Temp(Src) 97.9 F (36.6  C) (Oral)  Ht _0  (1.702 m)  Wt 193 lb (87.544 kg)  BMI 30.22 kg/m2  SpO2 97%  Physical Exam  Constitutional: He is oriented to person, place, and time. He appears well-developed and well-nourished. No distress.  HENT:  Head: Normocephalic and atraumatic.  Right Ear: External ear normal.  Left Ear: External ear normal.  Musculoskeletal:  Deltoid 5/5 Bilateral,  Biceps 5/5 bilateral, Wrist extensors 5/5 bilateral, Triceps 5/5 bilateral, finger flexors and abductors 5/5 bilateral, grip 5/5 bilateraly no Hoffman's, intact heel/toe/sequential walking, sensation intact upper and lower extremities. Straight leg raise negative bilaterally. DTR's upper and lower 2+   Neurological: He is alert and oriented to person, place, and time.  Skin: Skin is warm and dry. No rash noted. He is not diaphoretic.  Psychiatric: He has a normal mood and affect. His behavior is normal. Judgment and thought content normal.   Assessment & Plan:   Colin Love was seen today for neck pain.  Diagnoses and all orders for this visit:  Trapezius strain, unspecified laterality, initial encounter  Other orders -     meloxicam (MOBIC) 15 MG tablet; Take 1 tablet (15 mg total) by mouth daily. -     cyclobenzaprine (FLEXERIL) 5 MG tablet; Take 1 tablet (5 mg total) by mouth at bedtime.  I am having Colin Love start on meloxicam and cyclobenzaprine. I am also having him maintain his aspirin, lisinopril, atorvastatin, diltiazem, metFORMIN, terazosin, lisinopril-hydrochlorothiazide, blood glucose meter kit and supplies, buPROPion, ACCU-CHEK AVIVA PLUS, and ACCU-CHEK SOFTCLIX LANCETS.  Meds ordered this encounter  Medications  . ACCU-CHEK AVIVA PLUS test strip    Sig: TEST UP TO  four times a day    Refill:  0  . ACCU-CHEK SOFTCLIX LANCETS lancets    Sig: TEST UP TO four times a day    Refill:  0  . meloxicam (MOBIC) 15 MG tablet    Sig: Take 1 tablet (15 mg total) by mouth daily.    Dispense:  30 tablet    Refill:  0    Order Specific Question:  Supervising Provider    Answer:  Deborra Medina L [[id number]]  . cyclobenzaprine (FLEXERIL) 5 MG tablet    Sig: Take 1 tablet (5 mg total) by mouth at bedtime.    Dispense:  30 tablet    Refill:  0    Order Specific Question:  Supervising Provider    Answer:  Crecencio Mc [[id number]]     Follow-up: Return if symptoms worsen or fail to  improve.

## 2015-05-29 DIAGNOSIS — S46819A Strain of other muscles, fascia and tendons at shoulder and upper arm level, unspecified arm, initial encounter: Secondary | ICD-10-CM | POA: Insufficient documentation

## 2015-05-29 NOTE — Assessment & Plan Note (Signed)
New problem We'll try Mobic and Flexeril Flexeril at night- discussed fall risk, may start with half tablet Mobic- don't take with other NSAIDs and if stomach bothers him-stop the medication.  Neurologically intact at this time, feel this is MSK strain FU prn worsening/failure to improve.

## 2015-08-07 ENCOUNTER — Encounter: Payer: Self-pay | Admitting: Internal Medicine

## 2015-08-07 ENCOUNTER — Ambulatory Visit (INDEPENDENT_AMBULATORY_CARE_PROVIDER_SITE_OTHER): Payer: Commercial Managed Care - HMO | Admitting: Internal Medicine

## 2015-08-07 ENCOUNTER — Other Ambulatory Visit: Payer: Self-pay | Admitting: Internal Medicine

## 2015-08-07 ENCOUNTER — Other Ambulatory Visit (INDEPENDENT_AMBULATORY_CARE_PROVIDER_SITE_OTHER): Payer: Commercial Managed Care - HMO

## 2015-08-07 VITALS — BP 120/74 | HR 80 | Resp 20 | Wt 190.0 lb

## 2015-08-07 DIAGNOSIS — H9121 Sudden idiopathic hearing loss, right ear: Secondary | ICD-10-CM

## 2015-08-07 DIAGNOSIS — E119 Type 2 diabetes mellitus without complications: Secondary | ICD-10-CM

## 2015-08-07 DIAGNOSIS — I10 Essential (primary) hypertension: Secondary | ICD-10-CM

## 2015-08-07 DIAGNOSIS — R42 Dizziness and giddiness: Secondary | ICD-10-CM | POA: Diagnosis not present

## 2015-08-07 DIAGNOSIS — I499 Cardiac arrhythmia, unspecified: Secondary | ICD-10-CM | POA: Insufficient documentation

## 2015-08-07 LAB — CBC WITH DIFFERENTIAL/PLATELET
Basophils Absolute: 0 10*3/uL (ref 0.0–0.1)
Basophils Relative: 0.1 % (ref 0.0–3.0)
Eosinophils Absolute: 0.1 10*3/uL (ref 0.0–0.7)
Eosinophils Relative: 1.5 % (ref 0.0–5.0)
HCT: 36.5 % — ABNORMAL LOW (ref 39.0–52.0)
Hemoglobin: 12.1 g/dL — ABNORMAL LOW (ref 13.0–17.0)
Lymphocytes Relative: 12 % (ref 12.0–46.0)
Lymphs Abs: 1 10*3/uL (ref 0.7–4.0)
MCHC: 33 g/dL (ref 30.0–36.0)
MCV: 82.9 fl (ref 78.0–100.0)
Monocytes Absolute: 0.9 10*3/uL (ref 0.1–1.0)
Monocytes Relative: 11.6 % (ref 3.0–12.0)
Neutro Abs: 5.9 10*3/uL (ref 1.4–7.7)
Neutrophils Relative %: 74.8 % (ref 43.0–77.0)
Platelets: 201 10*3/uL (ref 150.0–400.0)
RBC: 4.4 Mil/uL (ref 4.22–5.81)
RDW: 14.6 % (ref 11.5–15.5)
WBC: 7.9 10*3/uL (ref 4.0–10.5)

## 2015-08-07 LAB — HEPATIC FUNCTION PANEL
ALT: 51 U/L (ref 0–53)
AST: 60 U/L — ABNORMAL HIGH (ref 0–37)
Albumin: 3.4 g/dL — ABNORMAL LOW (ref 3.5–5.2)
Alkaline Phosphatase: 56 U/L (ref 39–117)
Bilirubin, Direct: 0.1 mg/dL (ref 0.0–0.3)
Total Bilirubin: 0.5 mg/dL (ref 0.2–1.2)
Total Protein: 6.6 g/dL (ref 6.0–8.3)

## 2015-08-07 LAB — BASIC METABOLIC PANEL WITH GFR
BUN: 20 mg/dL (ref 6–23)
CO2: 30 meq/L (ref 19–32)
Calcium: 10 mg/dL (ref 8.4–10.5)
Chloride: 99 meq/L (ref 96–112)
Creatinine, Ser: 0.78 mg/dL (ref 0.40–1.50)
GFR: 126.31 mL/min
Glucose, Bld: 109 mg/dL — ABNORMAL HIGH (ref 70–99)
Potassium: 3.2 meq/L — ABNORMAL LOW (ref 3.5–5.1)
Sodium: 135 meq/L (ref 135–145)

## 2015-08-07 LAB — URINALYSIS, ROUTINE W REFLEX MICROSCOPIC
Bilirubin Urine: NEGATIVE
Leukocytes, UA: NEGATIVE
Nitrite: NEGATIVE
Specific Gravity, Urine: 1.02
Total Protein, Urine: 30 — AB
Urine Glucose: NEGATIVE
Urobilinogen, UA: 4 — AB
pH: 6 (ref 5.0–8.0)

## 2015-08-07 LAB — TSH: TSH: 1.37 u[IU]/mL (ref 0.35–4.50)

## 2015-08-07 MED ORDER — POTASSIUM CHLORIDE ER 10 MEQ PO TBCR: 10.0000 meq | EXTENDED_RELEASE_TABLET | Freq: Every day | ORAL | Status: DC

## 2015-08-07 MED ORDER — LISINOPRIL 10 MG PO TABS: 10.0000 mg | ORAL_TABLET | Freq: Every day | ORAL | Status: DC

## 2015-08-07 NOTE — Assessment & Plan Note (Addendum)
Etiology unclear, suspect related to HCT use and lower BP but pt not sure if actually taking the lsinopril hct, for orthostatics today, ecg, echo, carotids, and routine lab r/o anemia et al as documented  Note:  Total time for pt hx, exam, review of record with pt in the room, determination of diagnoses and plan for further eval and tx is > 40 min, with over 50% spent in coordination and counseling of patient

## 2015-08-07 NOTE — Assessment & Plan Note (Signed)
Not clear if pt taking lsinopril 40 vs the lsinopril HCT 20/12.5 bid, so will d/c both, take lisinopril 10 mg only, cont all other tx,  to f/u any worsening symptoms or concern with PCP in 2 weeks

## 2015-08-07 NOTE — Assessment & Plan Note (Signed)
Ecg with possible afib I think, difficult to interpret, for cont'd asa, HR and volume ok, will have other tests and refer cardiology as well

## 2015-08-07 NOTE — Assessment & Plan Note (Signed)
Suspect stable overall by history and exam, recent data reviewed with pt, and pt to continue medical treatment as before,  to f/u any worsening symptoms or concerns

## 2015-08-07 NOTE — Patient Instructions (Addendum)
Your EKG was OK today  Your right ear was irrigated of wax today  Stop the lisinopril 40 mg if you are taking this  Stop the lisinopril 20/12/5 mg if you are taking this  Please take all new medication as prescribed - the lower dose lisinopril 10 mg per day  Please continue all other medications as before, and refills have been done if requested.  Please have the pharmacy call with any other refills you may need.  Please keep your appointments with your specialists as you may have planned  You will be contacted regarding the referral for: Echocardiogram, and carotid ultrasound, and cardiology  Please go to the XRAY Department in the Basement (go straight as you get off the elevator) for the x-ray testing  Please go to the LAB in the Basement (turn left off the elevator) for the tests to be done today  You will be contacted by phone if any changes need to be made immediately.  Otherwise, you will receive a letter about your results with an explanation, but please check with MyChart first.  Please remember to sign up for MyChart if you have not done so, as this will be important to you in the future with finding out test results, communicating by private email, and scheduling acute appointments online when needed.  Please return in 3 weeks to Dr Quay Burow, or sooner if needed

## 2015-08-07 NOTE — Assessment & Plan Note (Signed)
Improved s/p right ear irrigation of wax impaction,  to f/u any worsening symptoms or concerns

## 2015-08-07 NOTE — Progress Notes (Signed)
Pre visit review using our clinic review tool, if applicable. No additional management support is needed unless otherwise documented below in the visit note. 

## 2015-08-07 NOTE — Progress Notes (Signed)
Subjective:    Patient ID: Colin Love, male    DOB: May 14, 1945, 70 y.o.   MRN: [id number]  HPI  Here to f/u BP as PCP not available, was out of town last wk, experienced an episode one day of lack of energy, seemed unsteady to driving and wife concerned but did not have to have him pull over.  Did seem a bit staggery ,weak, leaning on wall after got out of the car after 6 hrs drive,mild conufsed, did stop twice along the way.  Wife happened to have her BP instrument in the car last thur may 25, but did not check BPright away, seemed weak and tired the next day though just from the trip, so checked BP on sat and then daily after with sbp's in the 110's, except for one time a few days later was 153, then mon may 29 was 110/53.  Did have constipatoin for about 3 days, and hiccups a couple days recent as well.  Pt can drink plenty of fluid but may have been somewhat less recently for unclear reason, except is trying to lose wt on purpose but cutting back on taking anything po. Lost 5 lbs since mar 2017.   BP Readings from Last 3 Encounters:  08/07/15 122/64  05/24/15 130/80  05/21/15 120/74   Pt denies chest pain, increased sob or doe, wheezing, orthopnea, PND, increased LE swelling, palpitations, or syncope.  Pt denies new neurological symptoms such as new headache, or facial or extremity weakness or numbness, though states right side has been weaker overall since retired, has known c-spine djd, and wife has concerns the right leg was trying to giveaway she attributed to his knee..  Pt denies polydipsia, polyuria, or low sugar symptoms such as weakness or confusion improved with po intake.  Pt states overall good compliance with meds.  CBG's at home in low 100's, such as 135, 131 earlier this wk.  Pt denies fever, 0 loss of appetite, or other constitutional symptoms., though one night wife and pt noticed night sweats.   Sees urology Dr Frutoso Schatz for bladder ca, last visit about 3 wks ago, felt stable,  for f/u at 1 yr.  With respect to meds, has been taking the hytrin without difficulty for "some time".  Current med list shows lisinopril 40 mg rx in sept 2016, then apparent change to lsinopril HCT in nov 2016 but the prior not taken off the list. Past Medical History  Diagnosis Date  . Hypertension   . Hyperlipidemia   . Type 2 diabetes mellitus ([place])   . History of diverticulitis of colon   . Bladder cancer ([place]) 02/2014    local, low grade pap uro carcinoma  . History of adenomatous polyp of colon   . GERD (gastroesophageal reflux disease)   . Diverticulosis of colon   . Adenoma of left adrenal gland   . BPH (benign prostatic hypertrophy)   . PAC (premature atrial contraction)   . Full dentures   . At risk for sleep apnea     STOP-BANG= 4    SENT TO PCP 02-06-2014   Past Surgical History  Procedure Laterality Date  . Anterior cervical decomp/discectomy fusion  05-24-2003    C4  --  C7  . Colonoscopy w/ polypectomy  last one 07-24-2013 w/ EGD  . Cystoscopy w/ retrogrades Bilateral 02/09/2014    Procedure: BILATERAL RETROGRADE PYELOGRAM , Cystoscopy;  Surgeon: Ardis Hughs, MD;  Location: [facility];  Service: Urology;  Laterality: Bilateral;  . Transurethral resection of bladder tumor with gyrus (turbt-gyrus) N/A 02/09/2014    Procedure: Bladder Biopsies;  Surgeon: Ardis Hughs, MD;  Location: [facility];  Service: Urology;  Laterality: N/A;    reports that he has been smoking Cigarettes.  He has a 20 pack-year smoking history. He has never used smokeless tobacco. He reports that he drinks alcohol. He reports that he does not use illicit drugs. family history includes Colon cancer in his mother; Diabetes in his brother; Hyperlipidemia in his father and mother; Hypertension in his father and mother. No Known Allergies Current Outpatient Prescriptions on File Prior to Visit  Medication Sig Dispense Refill  . ACCU-CHEK AVIVA PLUS test strip  TEST UP TO  four times a day  0  . ACCU-CHEK SOFTCLIX LANCETS lancets TEST UP TO four times a day  0  . aspirin 81 MG tablet Take 81 mg by mouth daily.      Marland Kitchen atorvastatin (LIPITOR) 20 MG tablet Take 1 tablet (20 mg total) by mouth daily at 6 PM. 90 tablet 3  . blood glucose meter kit and supplies KIT Dispense based on patient and insurance preference. Use up to four times daily as directed. (FOR ICD-9 250.00, 250.01). 1 each 0  . buPROPion (ZYBAN) 150 MG 12 hr tablet Take 1 tablet (150 mg total) by mouth 2 (two) times daily. 60 tablet 3  . cyclobenzaprine (FLEXERIL) 5 MG tablet Take 1 tablet (5 mg total) by mouth at bedtime. 30 tablet 0  . diltiazem (CARDIZEM CD) 180 MG 24 hr capsule Take 1 capsule (180 mg total) by mouth 2 (two) times daily. 180 capsule 3  . lisinopril (PRINIVIL,ZESTRIL) 40 MG tablet Take 1 tablet (40 mg total) by mouth daily. 90 tablet 3  . lisinopril-hydrochlorothiazide (PRINZIDE,ZESTORETIC) 20-12.5 MG tablet Take 1 tablet by mouth 2 (two) times daily. 180 tablet 3  . meloxicam (MOBIC) 15 MG tablet Take 1 tablet (15 mg total) by mouth daily. 30 tablet 0  . metFORMIN (GLUCOPHAGE) 500 MG tablet Take 1 tablet (500 mg total) by mouth 2 (two) times daily with a meal. 180 tablet 3  . terazosin (HYTRIN) 10 MG capsule Take 1 capsule (10 mg total) by mouth at bedtime. 90 capsule 3   No current facility-administered medications on file prior to visit.   Review of Systems     Objective:   Physical Exam BP 122/64 mmHg  Pulse 80  Resp 20  Wt 190 lb (86.183 kg)  SpO2 98% VS noted, including orthostatics on extended vital tab Constitutional: Pt appears in no apparent distress HENT: Head: NCAT.  Right Ear: External ear normal.  Left Ear: External ear normal.  Right canal wax impaction resolved with irrigation, hearing improved Eyes: . Pupils are equal, round, and reactive to light. Conjunctivae and EOM are normal Neck: Normal range of motion. Neck supple.  Cardiovascular:  Normal rate and regular rhythm.   Pulmonary/Chest: Effort normal and breath sounds without rales or wheezing.  Abd:  Soft, NT, ND, + BS Neurological: Pt is alert. Not confused , motor grossly intact Skin: Skin is warm. No rash, no LE edema Psychiatric: Pt behavior is normal. No agitation.   Most recent cxr: May 22, 2003 Clinical Data: Preoperative respiratory exam for cervical disc herniation.  TWO VIEW CHEST  No prior studies. No evidence of infiltrate, edema, nodule, or pleural effusion. The heart size is normal. Visualized bony thorax is unremarkable.  IMPRESSION  No active disease.  Most recent ECG: nov 19 , 2015 -  SR with PACs Lab Results  Component Value Date   WBC 9.0 12/26/2013   HGB 15.6 02/09/2014   HCT 46.0 02/09/2014   PLT 167.0 12/26/2013   GLUCOSE 130* 05/21/2015   CHOL 130 05/21/2015   TRIG 73.0 05/21/2015   HDL 52.60 05/21/2015   LDLCALC 63 05/21/2015   ALT 11 05/21/2015   AST 14 05/21/2015   NA 140 05/21/2015   K 3.6 05/21/2015   CL 103 05/21/2015   CREATININE 0.91 05/21/2015   BUN 14 05/21/2015   CO2 26 05/21/2015   TSH 0.94 11/13/2014   HGBA1C 6.7* 05/21/2015   MICROALBUR 11.2* 11/13/2014  ECG today: Possible Afib, HR 75, LAFB, nonspecific T abnormal, no acute changes    Assessment & Plan:

## 2015-08-29 ENCOUNTER — Encounter (HOSPITAL_COMMUNITY): Payer: Self-pay | Admitting: Nurse Practitioner

## 2015-08-29 ENCOUNTER — Emergency Department (HOSPITAL_COMMUNITY)
Admission: EM | Admit: 2015-08-29 | Discharge: 2015-08-29 | Disposition: A | Discharge status: Home / Self Care | Payer: Commercial Managed Care - HMO | Attending: Emergency Medicine | Admitting: Emergency Medicine

## 2015-08-29 DIAGNOSIS — E119 Type 2 diabetes mellitus without complications: Secondary | ICD-10-CM | POA: Insufficient documentation

## 2015-08-29 DIAGNOSIS — Y939 Activity, unspecified: Secondary | ICD-10-CM | POA: Insufficient documentation

## 2015-08-29 DIAGNOSIS — Z8551 Personal history of malignant neoplasm of bladder: Secondary | ICD-10-CM | POA: Diagnosis not present

## 2015-08-29 DIAGNOSIS — I10 Essential (primary) hypertension: Secondary | ICD-10-CM | POA: Diagnosis not present

## 2015-08-29 DIAGNOSIS — F17210 Nicotine dependence, cigarettes, uncomplicated: Secondary | ICD-10-CM | POA: Insufficient documentation

## 2015-08-29 DIAGNOSIS — Z7984 Long term (current) use of oral hypoglycemic drugs: Secondary | ICD-10-CM | POA: Insufficient documentation

## 2015-08-29 DIAGNOSIS — Z79899 Other long term (current) drug therapy: Secondary | ICD-10-CM | POA: Diagnosis not present

## 2015-08-29 DIAGNOSIS — S161XXA Strain of muscle, fascia and tendon at neck level, initial encounter: Secondary | ICD-10-CM

## 2015-08-29 DIAGNOSIS — Y999 Unspecified external cause status: Secondary | ICD-10-CM | POA: Diagnosis not present

## 2015-08-29 DIAGNOSIS — Y92410 Unspecified street and highway as the place of occurrence of the external cause: Secondary | ICD-10-CM | POA: Diagnosis not present

## 2015-08-29 DIAGNOSIS — S199XXA Unspecified injury of neck, initial encounter: Secondary | ICD-10-CM | POA: Diagnosis present

## 2015-08-29 DIAGNOSIS — Z7982 Long term (current) use of aspirin: Secondary | ICD-10-CM | POA: Insufficient documentation

## 2015-08-29 HISTORY — DX: Cardiac murmur, unspecified: R01.1

## 2015-08-29 MED ORDER — CYCLOBENZAPRINE HCL 10 MG PO TABS: 10.0000 mg | ORAL_TABLET | Freq: Two times a day (BID) | ORAL | Status: DC | PRN

## 2015-08-29 MED ORDER — HYDROCODONE-ACETAMINOPHEN 5-325 MG PO TABS: 1.0000 | ORAL_TABLET | Freq: Four times a day (QID) | ORAL | Status: DC | PRN
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
## 2015-08-29 NOTE — ED Notes (Signed)
Pt ambulates independently and with steady gait at time of discharge. Discharge instructions and follow up information reviewed with patient. No other questions or concerns voiced at this time.  
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
## 2015-09-16 ENCOUNTER — Ambulatory Visit (HOSPITAL_COMMUNITY): Payer: Commercial Managed Care - HMO | Attending: Cardiology

## 2015-09-16 ENCOUNTER — Other Ambulatory Visit: Payer: Self-pay

## 2015-09-16 DIAGNOSIS — H9121 Sudden idiopathic hearing loss, right ear: Secondary | ICD-10-CM | POA: Insufficient documentation

## 2015-09-16 DIAGNOSIS — I10 Essential (primary) hypertension: Secondary | ICD-10-CM

## 2015-09-16 DIAGNOSIS — E119 Type 2 diabetes mellitus without complications: Secondary | ICD-10-CM | POA: Diagnosis not present

## 2015-09-16 DIAGNOSIS — I119 Hypertensive heart disease without heart failure: Secondary | ICD-10-CM | POA: Diagnosis not present

## 2015-09-16 DIAGNOSIS — R29898 Other symptoms and signs involving the musculoskeletal system: Secondary | ICD-10-CM | POA: Insufficient documentation

## 2015-09-16 DIAGNOSIS — R42 Dizziness and giddiness: Secondary | ICD-10-CM | POA: Diagnosis not present

## 2015-09-16 DIAGNOSIS — R9431 Abnormal electrocardiogram [ECG] [EKG]: Secondary | ICD-10-CM | POA: Diagnosis present

## 2015-09-18 ENCOUNTER — Encounter: Payer: Self-pay | Admitting: Internal Medicine

## 2015-09-20 ENCOUNTER — Other Ambulatory Visit: Payer: Self-pay | Admitting: Emergency Medicine

## 2015-09-20 MED ORDER — ACCU-CHEK AVIVA PLUS VI STRP: ORAL_STRIP | Status: DC

## 2015-09-20 MED ORDER — ACCU-CHEK SOFTCLIX LANCETS MISC: Status: DC

## 2015-11-26 ENCOUNTER — Ambulatory Visit (INDEPENDENT_AMBULATORY_CARE_PROVIDER_SITE_OTHER): Payer: Commercial Managed Care - HMO | Admitting: Internal Medicine

## 2015-11-26 ENCOUNTER — Other Ambulatory Visit (INDEPENDENT_AMBULATORY_CARE_PROVIDER_SITE_OTHER): Payer: Commercial Managed Care - HMO

## 2015-11-26 VITALS — BP 140/80 | HR 82 | Ht 67.0 in | Wt 190.2 lb

## 2015-11-26 DIAGNOSIS — E785 Hyperlipidemia, unspecified: Secondary | ICD-10-CM

## 2015-11-26 DIAGNOSIS — Z0000 Encounter for general adult medical examination without abnormal findings: Secondary | ICD-10-CM | POA: Diagnosis not present

## 2015-11-26 DIAGNOSIS — E119 Type 2 diabetes mellitus without complications: Secondary | ICD-10-CM

## 2015-11-26 DIAGNOSIS — I10 Essential (primary) hypertension: Secondary | ICD-10-CM

## 2015-11-26 DIAGNOSIS — Z23 Encounter for immunization: Secondary | ICD-10-CM

## 2015-11-26 LAB — COMPREHENSIVE METABOLIC PANEL WITH GFR
ALT: 11 U/L (ref 0–53)
AST: 15 U/L (ref 0–37)
Albumin: 4.4 g/dL (ref 3.5–5.2)
Alkaline Phosphatase: 80 U/L (ref 39–117)
BUN: 16 mg/dL (ref 6–23)
CO2: 30 meq/L (ref 19–32)
Calcium: 10 mg/dL (ref 8.4–10.5)
Chloride: 100 meq/L (ref 96–112)
Creatinine, Ser: 0.81 mg/dL (ref 0.40–1.50)
GFR: 120.82 mL/min
Glucose, Bld: 106 mg/dL — ABNORMAL HIGH (ref 70–99)
Potassium: 3.4 meq/L — ABNORMAL LOW (ref 3.5–5.1)
Sodium: 138 meq/L (ref 135–145)
Total Bilirubin: 0.6 mg/dL (ref 0.2–1.2)
Total Protein: 7.6 g/dL (ref 6.0–8.3)

## 2015-11-26 LAB — HEMOGLOBIN A1C: Hgb A1c MFr Bld: 6.2 % (ref 4.6–6.5)

## 2015-11-26 NOTE — Assessment & Plan Note (Signed)
BP at home 120/75 on average Continue current medications at current doses cmp

## 2015-11-26 NOTE — Progress Notes (Signed)
Subjective:    Patient ID: Colin Love, male    DOB: Oct 14, 1945, 70 y.o.   MRN: [id number]  HPI The patient is here for follow up.  Diabetes: He is taking his medication daily as prescribed. He is compliant with a diabetic diet. He is not exercising regularly. He monitors his sugars and they have been running 110-130. He checks his feet daily and denies foot lesions. He is up-to-date with an ophthalmology examination.   Hypertension: He is taking his medication daily. He is compliant with a low sodium diet.  He denies chest pain, palpitations, edema, shortness of breath and regular headaches. He is not exercising regularly.  He does not monitor his blood pressure at home.    Hyperlipidemia: He is taking his medication daily. He is compliant with a low fat/cholesterol diet. He is not exercising regularly. He denies myalgias.    Medications and allergies reviewed with patient and updated if appropriate.  Patient Active Problem List   Diagnosis Date Noted  . Dizziness and giddiness 08/07/2015  . Sudden right hearing loss 08/07/2015  . Irregular heart rhythm 08/07/2015  . Trapezius strain 05/29/2015  . Glaucoma 05/21/2015  . Premature atrial complexes 01/24/2014  . Bladder cancer ([place]) 01/09/2014  . Smoker   . Diabetes type 2, controlled ([place]) 12/23/2009  . Dyslipidemia 12/23/2009  . Essential hypertension 12/23/2009  . COLONIC POLYPS, HX OF 12/23/2009  . DIVERTICULITIS, HX OF 12/23/2009    Current Outpatient Prescriptions on File Prior to Visit  Medication Sig Dispense Refill  . ACCU-CHEK AVIVA PLUS test strip Test up to 4 times per day. DX E11.9 100 each 2  . ACCU-CHEK SOFTCLIX LANCETS lancets Test up to 4 times per day. DX E11.9 100 each 2  . aspirin 81 MG tablet Take 81 mg by mouth daily.      Marland Kitchen atorvastatin (LIPITOR) 20 MG tablet Take 1 tablet (20 mg total) by mouth daily at 6 PM. 90 tablet 3  . blood glucose meter kit and supplies KIT Dispense based on patient and  insurance preference. Use up to four times daily as directed. (FOR ICD-9 250.00, 250.01). 1 each 0  . diltiazem (CARDIZEM CD) 180 MG 24 hr capsule Take 1 capsule (180 mg total) by mouth 2 (two) times daily. 180 capsule 3  . lisinopril (PRINIVIL,ZESTRIL) 10 MG tablet Take 1 tablet (10 mg total) by mouth daily. 90 tablet 3  . lisinopril-hydrochlorothiazide (PRINZIDE,ZESTORETIC) 20-12.5 MG tablet Take 1 tablet by mouth 2 (two) times daily. 180 tablet 3  . meloxicam (MOBIC) 15 MG tablet Take 1 tablet (15 mg total) by mouth daily. 30 tablet 0  . metFORMIN (GLUCOPHAGE) 500 MG tablet Take 1 tablet (500 mg total) by mouth 2 (two) times daily with a meal. 180 tablet 3  . potassium chloride (K-DUR) 10 MEQ tablet Take 1 tablet (10 mEq total) by mouth daily. 7 tablet 0  . terazosin (HYTRIN) 10 MG capsule Take 1 capsule (10 mg total) by mouth at bedtime. 90 capsule 3   No current facility-administered medications on file prior to visit.     Past Medical History:  Diagnosis Date  . Adenoma of left adrenal gland   . At risk for sleep apnea    STOP-BANG= 4    SENT TO PCP 02-06-2014  . Bladder cancer ([place]) 02/2014   local, low grade pap uro carcinoma  . BPH (benign prostatic hypertrophy)   . Diverticulosis of colon   . Full dentures   .  GERD (gastroesophageal reflux disease)   . History of adenomatous polyp of colon   . History of diverticulitis of colon   . Hyperlipidemia   . Hypertension   . Murmur, cardiac   . PAC (premature atrial contraction)   . Type 2 diabetes mellitus ([place])     Past Surgical History:  Procedure Laterality Date  . ANTERIOR CERVICAL DECOMP/DISCECTOMY FUSION  05-24-2003   C4  --  C7  . COLONOSCOPY W/ POLYPECTOMY  last one 07-24-2013 w/ EGD  . CYSTOSCOPY W/ RETROGRADES Bilateral 02/09/2014   Procedure: BILATERAL RETROGRADE PYELOGRAM , Cystoscopy;  Surgeon: Ardis Hughs, MD;  Location: [facility];  Service: Urology;  Laterality: Bilateral;  .  TRANSURETHRAL RESECTION OF BLADDER TUMOR WITH GYRUS (TURBT-GYRUS) N/A 02/09/2014   Procedure: Bladder Biopsies;  Surgeon: Ardis Hughs, MD;  Location: [facility];  Service: Urology;  Laterality: N/A;    Social History   Social History  . Marital status: Married    Spouse name: N/A  . Number of children: N/A  . Years of education: N/A   Social History Main Topics  . Smoking status: Current Every Day Smoker    Packs/day: 0.50    Years: 40.00    Types: Cigarettes  . Smokeless tobacco: Never Used  . Alcohol use Yes     Comment: OCCASIONAL  . Drug use: No  . Sexual activity: Not on file   Other Topics Concern  . Not on file   Social History Narrative  . No narrative on file    Family History  Problem Relation Age of Onset  . Colon cancer Mother   . Hypertension Mother   . Hyperlipidemia Mother   . Hypertension Father   . Hyperlipidemia Father   . Diabetes Brother     Review of Systems  Constitutional: Negative for chills, fatigue and fever.  Respiratory: Negative for apnea, cough, shortness of breath and wheezing.        Snores  Cardiovascular: Negative for chest pain, palpitations and leg swelling.  Neurological: Negative for dizziness and numbness.       Objective:   Vitals:   11/26/15 0955  BP: 140/80  Pulse: 82   Filed Weights   11/26/15 0955  Weight: 190 lb 4 oz (86.3 kg)   Body mass index is 29.8 kg/m.   Physical Exam    Constitutional: Appears well-developed and well-nourished. No distress.  HENT:  Head: Normocephalic and atraumatic.  Neck: Neck supple. No tracheal deviation present. No thyromegaly present.  Cardiovascular: Normal rate, regular rhythm and normal heart sounds.   No murmur heard. No carotid bruit  Pulmonary/Chest: Effort normal and breath sounds normal. No respiratory distress. No has no wheezes. No rales.  Musculoskeletal: No edema.  Lymphadenopathy: No cervical adenopathy.  Skin: Skin is warm and dry.  Not diaphoretic.  Psychiatric: Normal mood and affect. Behavior is normal.     Assessment & Plan:    Flu vaccine today  See Problem List for Assessment and Plan of chronic medical problems.

## 2015-11-26 NOTE — Patient Instructions (Addendum)
Test(s) ordered today. Your results will be released to [place] (or called to you) after review, usually within 72hours after test completion. If any changes need to be made, you will be notified at that same time.  All other Health Maintenance issues reviewed.   All recommended immunizations and age-appropriate screenings are up-to-date or discussed.  No immunizations administered today.   Medications reviewed and updated.  Changes include  /  No changes recommended at this time.  Your prescription(s) have been submitted to your pharmacy. Please take as directed and contact our office if you believe you are having problem(s) with the medication(s).  A referral  Please followup in    Colin Love , Thank you for taking time to come for your Medicare Wellness Visit. I appreciate your ongoing commitment to your health goals. Please review the following plan we discussed and let me know if I can assist you in the future.   To call Dr. Collene Love   Check out  online nutrition programs as GumSearch.nl and http://vang.com/; fit5m; Look for foods with "whole" wheat; bran; oatmeal etc Shot at the farmer's markets in season for fresher choices  Watch for "hydrogenated" on the label of oils which are trans-fats/ chips; packaged snacks  Watch for "high fructose corn syrup" in snacks, yogurt or ketchup  Meats have less marbling; bright colored fruits and vegetables;  Canned; dump out liquid and wash vegetables. Be mindful of what we are eating  Portion control is essential to a health weight! Sit down; take a break and enjoy your meal; take smaller bites; put the fork down between bites;  It takes 20 minutes to get full; so check in with your fullness cues and stop eating when you start to fill full   Older adults aged 624or older, who are generally fit and have no health conditions that limit their mobility, should try to be active daily and should do: at least 150 minutes of moderate aerobic  activity such as cycling or walking every week, and  strength exercises on two or more days a week that work all the major muscles (legs, hips, back, abdomen, chest, shoulders and arms).      OR  75 minutes of vigorous aerobic activity such as running or a game of singles tennis every week, and  strength exercises on two or more days a week that work all the major muscles (legs, hips, back, abdomen, chest, shoulders and arms).     OR  a mix of moderate and vigorous aerobic activity every week. For example, two 30-minute runs, plus 30 minutes of fast walking, equates to 150 minutes of moderate aerobic activity, and  strength exercises on two or more days a week that work all the major muscles (legs, hips, back, abdomen, chest, shoulders and arms).  A rule of thumb is that one minute of vigorous activity provides the same health benefits as two minutes of moderate activity. You should also try to break up long periods of sitting with light activity, as sedentary behaviour is now considered an independent risk factor for ill health, no matter how much exercise you do. Find out why sitting is bad for your health. Older adults at risk of falls, such as people with weak legs, poor balance and some medical conditions, should do exercises to improve balance and co-ordination on at least two days a week. Examples include yoga, tai chi and dancing.  Shingles: Educated to check with insurance regarding coverage of Shingles vaccination on Part  D or Part B and may have lower co-pay if provided on the Part D side     These are the goals we discussed: Goals    . Quit smoking / using tobacco          Smoking;  Educated to avoid secondary smoke Smoking cessation in [place]: [name] at [phone] -day  Classes offered a couple of times a month;  Will work with the patient as far as registration and location  Meds may help; chatix (Varenicline); Zyban (Bupropion SR); Nicotine Replacement (gum;  lozenges; patches; etc.)   30 pack yr smoking hx: Educated regarding LDCT; To discuss with MD at next fup. Also educated on AAA screening for men 65-75 who have smoked        This is a list of the screening recommended for you and due dates:  Health Maintenance  Topic Date Due  . Shingles Vaccine  12/11/2004  . Flu Shot  10/08/2015  . Hemoglobin A1C  11/21/2015  . Eye exam for diabetics  04/14/2016  . Complete foot exam   06/12/2016  . Tetanus Vaccine  07/01/2020  . Colon Cancer Screening  07/24/2020  .  Hepatitis C: One time screening is recommended by Center for Disease Control  (CDC) for  adults born from [birth year] through 1966.   Completed  . Pneumonia vaccines  Completed      Test(s) ordered today. Your results will be released to [place] (or called to you) after review, usually within 72hours after test completion. If any changes need to be made, you will be notified at that same time.   Medications reviewed and updated.  No changes recommended at this time.    Please followup in 6 months
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
## 2015-11-27 ENCOUNTER — Encounter: Payer: Self-pay | Admitting: Internal Medicine

## 2015-11-27 NOTE — Assessment & Plan Note (Signed)
Lipid has been well controlled Continue statin

## 2015-11-27 NOTE — Assessment & Plan Note (Signed)
Sugars controlled at home Check a1c Continue metformin

## 2016-01-10 ENCOUNTER — Other Ambulatory Visit: Payer: Self-pay | Admitting: Internal Medicine

## 2016-01-13 NOTE — Telephone Encounter (Signed)
Please advise, not on current med list.  

## 2016-02-14 ENCOUNTER — Other Ambulatory Visit: Payer: Self-pay | Admitting: Internal Medicine

## 2016-05-26 ENCOUNTER — Other Ambulatory Visit (INDEPENDENT_AMBULATORY_CARE_PROVIDER_SITE_OTHER): Payer: Medicare PPO

## 2016-05-26 ENCOUNTER — Ambulatory Visit (INDEPENDENT_AMBULATORY_CARE_PROVIDER_SITE_OTHER): Payer: Medicare PPO | Admitting: Internal Medicine

## 2016-05-26 ENCOUNTER — Encounter: Payer: Self-pay | Admitting: Internal Medicine

## 2016-05-26 VITALS — BP 102/62 | HR 97 | Temp 98.1°F | Resp 16 | Ht 67.0 in | Wt 196.0 lb

## 2016-05-26 DIAGNOSIS — R208 Other disturbances of skin sensation: Secondary | ICD-10-CM

## 2016-05-26 DIAGNOSIS — F17200 Nicotine dependence, unspecified, uncomplicated: Secondary | ICD-10-CM | POA: Diagnosis not present

## 2016-05-26 DIAGNOSIS — I10 Essential (primary) hypertension: Secondary | ICD-10-CM

## 2016-05-26 DIAGNOSIS — E785 Hyperlipidemia, unspecified: Secondary | ICD-10-CM | POA: Diagnosis not present

## 2016-05-26 DIAGNOSIS — G629 Polyneuropathy, unspecified: Secondary | ICD-10-CM | POA: Insufficient documentation

## 2016-05-26 DIAGNOSIS — R931 Abnormal findings on diagnostic imaging of heart and coronary circulation: Secondary | ICD-10-CM | POA: Insufficient documentation

## 2016-05-26 DIAGNOSIS — E119 Type 2 diabetes mellitus without complications: Secondary | ICD-10-CM | POA: Diagnosis not present

## 2016-05-26 LAB — LIPID PANEL
Cholesterol: 106 mg/dL (ref 0–200)
HDL: 43.7 mg/dL
LDL Cholesterol: 47 mg/dL (ref 0–99)
NonHDL: 61.9
Total CHOL/HDL Ratio: 2
Triglycerides: 74 mg/dL (ref 0.0–149.0)
VLDL: 14.8 mg/dL (ref 0.0–40.0)

## 2016-05-26 LAB — COMPREHENSIVE METABOLIC PANEL WITH GFR
ALT: 11 U/L (ref 0–53)
AST: 13 U/L (ref 0–37)
Albumin: 4.1 g/dL (ref 3.5–5.2)
Alkaline Phosphatase: 63 U/L (ref 39–117)
BUN: 13 mg/dL (ref 6–23)
CO2: 26 meq/L (ref 19–32)
Calcium: 10 mg/dL (ref 8.4–10.5)
Chloride: 103 meq/L (ref 96–112)
Creatinine, Ser: 0.9 mg/dL (ref 0.40–1.50)
GFR: 106.84 mL/min
Glucose, Bld: 116 mg/dL — ABNORMAL HIGH (ref 70–99)
Potassium: 3.2 meq/L — ABNORMAL LOW (ref 3.5–5.1)
Sodium: 137 meq/L (ref 135–145)
Total Bilirubin: 0.6 mg/dL (ref 0.2–1.2)
Total Protein: 6.7 g/dL (ref 6.0–8.3)

## 2016-05-26 LAB — HEMOGLOBIN A1C: Hgb A1c MFr Bld: 6.4 % (ref 4.6–6.5)

## 2016-05-26 MED ORDER — GABAPENTIN 100 MG PO CAPS: 100.0000 mg | ORAL_CAPSULE | Freq: Every day | ORAL | 3 refills | Status: DC | PRN

## 2016-05-26 NOTE — Assessment & Plan Note (Signed)
BP well controlled Current regimen effective and well tolerated Continue current medications at current doses cmp  

## 2016-05-26 NOTE — Patient Instructions (Addendum)
  Test(s) ordered today. Your results will be released to [place] (or called to you) after review, usually within 72hours after test completion. If any changes need to be made, you will be notified at that same time.   Medications reviewed and updated.  No changes recommended at this time.   A referral was ordered for cardiology  Please followup in 6 months

## 2016-05-26 NOTE — Assessment & Plan Note (Signed)
Check a1c Low sugar / carb diet Stressed regular exercise, weight loss  

## 2016-05-26 NOTE — Progress Notes (Signed)
Pre visit review using our clinic review tool, if applicable. No additional management support is needed unless otherwise documented below in the visit note. 

## 2016-05-26 NOTE — Assessment & Plan Note (Addendum)
Still smoking Wants to quit Will try patches

## 2016-05-26 NOTE — Assessment & Plan Note (Signed)
EF 55-60%, akinesis of basalinferior region, grade 1 diastolic dysfunction, LA and RA mildly dilateed, RV moderately dilated Will refer to cardiology for further evaluation Discussed smoking cessation Discussed possibility of OSA - he deferred sleep study

## 2016-05-26 NOTE — Assessment & Plan Note (Signed)
Intermittent, mild  ? Diabetic neuropathy May want to try gabapentin in near future -ok to try low dose as needed - will place on med list if he wants to try it

## 2016-05-26 NOTE — Progress Notes (Signed)
Subjective:    Patient ID: Colin Love, male    DOB: 03-29-1945, 71 y.o.   MRN: [id number]  HPI The patient is here for follow up.  Diabetes: He is taking his medication daily as prescribed. He is compliant with a diabetic diet. He is not exercising regularly. He monitors his sugars and they have been running 96-130. He checks his feet daily and denies foot lesions. He is up-to-date with an ophthalmology examination.   Hypertension: He is taking his medication daily. He is compliant with a low sodium diet.  He denies chest pain, palpitations, edema, shortness of breath and regular headaches. He is not exercising regularly.  He does not monitor his blood pressure at home.    Hyperlipidemia: He is taking his medication daily. He is compliant with a low fat/cholesterol diet. He is not exercising regularly. He denies myalgias.    Occasionally he has burning in his feet.  He wonders if he can have a medication to take as needed.    Medications and allergies reviewed with patient and updated if appropriate.  Patient Active Problem List   Diagnosis Date Noted  . Dizziness and giddiness 08/07/2015  . Sudden right hearing loss 08/07/2015  . Irregular heart rhythm 08/07/2015  . Glaucoma 05/21/2015  . Premature atrial complexes 01/24/2014  . Bladder cancer ([place]) 01/09/2014  . Smoker   . Diabetes type 2, controlled ([place]) 12/23/2009  . Dyslipidemia 12/23/2009  . Essential hypertension 12/23/2009  . COLONIC POLYPS, HX OF 12/23/2009  . DIVERTICULITIS, HX OF 12/23/2009    Current Outpatient Prescriptions on File Prior to Visit  Medication Sig Dispense Refill  . ACCU-CHEK AVIVA PLUS test strip Test up to 4 times per day. DX E11.9 100 each 2  . ACCU-CHEK SOFTCLIX LANCETS lancets Test up to 4 times per day. DX E11.9 100 each 2  . aspirin 81 MG tablet Take 81 mg by mouth daily.      Marland Kitchen atorvastatin (LIPITOR) 20 MG tablet take 1 tablet by mouth once daily AT 6 PM 90 tablet 3  . blood  glucose meter kit and supplies KIT Dispense based on patient and insurance preference. Use up to four times daily as directed. (FOR ICD-9 250.00, 250.01). 1 each 0  . CARTIA XT 180 MG 24 hr capsule take 1 capsule by mouth twice a day 180 capsule 3  . latanoprost (XALATAN) 0.005 % ophthalmic solution     . lisinopril-hydrochlorothiazide (PRINZIDE,ZESTORETIC) 20-12.5 MG tablet take 1 tablet by mouth twice a day 180 tablet 3  . metFORMIN (GLUCOPHAGE) 500 MG tablet take 1 tablet by mouth twice a day with food 180 tablet 2  . terazosin (HYTRIN) 10 MG capsule take 1 capsule by mouth at bedtime 90 capsule 2  . potassium chloride (K-DUR) 10 MEQ tablet Take 1 tablet (10 mEq total) by mouth daily. (Patient not taking: Reported on 05/26/2016) 7 tablet 0   No current facility-administered medications on file prior to visit.     Past Medical History:  Diagnosis Date  . Adenoma of left adrenal gland   . At risk for sleep apnea    STOP-BANG= 4    SENT TO PCP 02-06-2014  . Bladder cancer ([place]) 02/2014   local, low grade pap uro carcinoma  . BPH (benign prostatic hypertrophy)   . Diverticulosis of colon   . Full dentures   . GERD (gastroesophageal reflux disease)   . History of adenomatous polyp of colon   . History of diverticulitis  of colon   . Hyperlipidemia   . Hypertension   . Murmur, cardiac   . PAC (premature atrial contraction)   . Type 2 diabetes mellitus ([place])     Past Surgical History:  Procedure Laterality Date  . ANTERIOR CERVICAL DECOMP/DISCECTOMY FUSION  05-24-2003   C4  --  C7  . COLONOSCOPY W/ POLYPECTOMY  last one 07-24-2013 w/ EGD  . CYSTOSCOPY W/ RETROGRADES Bilateral 02/09/2014   Procedure: BILATERAL RETROGRADE PYELOGRAM , Cystoscopy;  Surgeon: Ardis Hughs, MD;  Location: [facility];  Service: Urology;  Laterality: Bilateral;  . TRANSURETHRAL RESECTION OF BLADDER TUMOR WITH GYRUS (TURBT-GYRUS) N/A 02/09/2014   Procedure: Bladder Biopsies;  Surgeon:  Ardis Hughs, MD;  Location: [facility];  Service: Urology;  Laterality: N/A;    Social History   Social History  . Marital status: Married    Spouse name: N/A  . Number of children: N/A  . Years of education: N/A   Social History Main Topics  . Smoking status: Current Every Day Smoker    Packs/day: 0.50    Years: 40.00    Types: Cigarettes  . Smokeless tobacco: Never Used  . Alcohol use Yes     Comment: OCCASIONAL  . Drug use: No  . Sexual activity: Not on file   Other Topics Concern  . Not on file   Social History Narrative  . No narrative on file    Family History  Problem Relation Age of Onset  . Colon cancer Mother   . Hypertension Mother   . Hyperlipidemia Mother   . Hypertension Father   . Hyperlipidemia Father   . Diabetes Brother     Review of Systems  Constitutional: Negative for chills and fever.  HENT: Positive for sore throat (scratchy a couple of days ago).   Respiratory: Positive for cough (dry, few days). Negative for shortness of breath and wheezing.   Cardiovascular: Positive for palpitations (esp at night). Negative for chest pain and leg swelling.  Neurological: Negative for light-headedness and headaches.       Objective:   Vitals:   05/26/16 0835  BP: 102/62  Pulse: 97  Resp: 16  Temp: 98.1 F (36.7 C)   Wt Readings from Last 3 Encounters:  05/26/16 196 lb (88.9 kg)  11/26/15 190 lb 4 oz (86.3 kg)  08/29/15 190 lb (86.2 kg)   Body mass index is 30.7 kg/m.   Physical Exam    Constitutional: Appears well-developed and well-nourished. No distress.  HENT:  Head: Normocephalic and atraumatic.  Neck: Neck supple. No tracheal deviation present. No thyromegaly present.  No cervical lymphadenopathy Cardiovascular: Normal rate, regular rhythm with occasional premature beats and normal heart sounds.  No murmur heard. No carotid bruit .  No edema Pulmonary/Chest: Effort normal and breath sounds normal. No  respiratory distress. No has no wheezes. No rales.  Ext: foot exam performed - normal sensation Skin: Skin is warm and dry. Not diaphoretic.  Psychiatric: Normal mood and affect. Behavior is normal.      Assessment & Plan:    See Problem List for Assessment and Plan of chronic medical problems.

## 2016-05-26 NOTE — Assessment & Plan Note (Signed)
Check lipid panel  Continue daily statin Regular exercise and healthy diet encouraged  

## 2016-06-01 ENCOUNTER — Other Ambulatory Visit: Payer: Self-pay | Admitting: Internal Medicine

## 2016-06-01 MED ORDER — POTASSIUM CHLORIDE CRYS ER 20 MEQ PO TBCR: 20.0000 meq | EXTENDED_RELEASE_TABLET | Freq: Every day | ORAL | 11 refills | Status: DC

## 2016-06-17 ENCOUNTER — Encounter: Payer: Self-pay | Admitting: Cardiology

## 2016-07-06 ENCOUNTER — Encounter: Payer: Self-pay | Admitting: Cardiology

## 2016-07-06 ENCOUNTER — Ambulatory Visit (INDEPENDENT_AMBULATORY_CARE_PROVIDER_SITE_OTHER): Payer: Medicare PPO | Admitting: Cardiology

## 2016-07-06 ENCOUNTER — Telehealth: Payer: Self-pay | Admitting: Internal Medicine

## 2016-07-06 VITALS — BP 138/64 | HR 99 | Ht 67.0 in | Wt 194.6 lb

## 2016-07-06 DIAGNOSIS — I499 Cardiac arrhythmia, unspecified: Secondary | ICD-10-CM | POA: Diagnosis not present

## 2016-07-06 DIAGNOSIS — I491 Atrial premature depolarization: Secondary | ICD-10-CM | POA: Diagnosis not present

## 2016-07-06 DIAGNOSIS — C679 Malignant neoplasm of bladder, unspecified: Secondary | ICD-10-CM | POA: Diagnosis not present

## 2016-07-06 DIAGNOSIS — E119 Type 2 diabetes mellitus without complications: Secondary | ICD-10-CM

## 2016-07-06 NOTE — Telephone Encounter (Signed)
lvm informing pt

## 2016-07-06 NOTE — Telephone Encounter (Signed)
ordered

## 2016-07-06 NOTE — Telephone Encounter (Signed)
Patient needs a referral to see his eye doctor. He's doctor informed him he had to have a referral from his pcp every time he comes.   Dr. Katy Apo: [facility] [phone] [address], [place], [place] [address]  Thank you.

## 2016-07-06 NOTE — Progress Notes (Signed)
[facility]. [address], [address] [place], [place]  [address] Phone: [phone] Fax:  [phone]  Date:  07/06/2016   ID:  Colin Love, DOB 1946-03-02, MRN [id number]  PCP:  Binnie Rail, MD   History of Present Illness: Colin Love is a 71 y.o. male [name]) here for follow-up of presumed arrhythmia thought to be paroxysmal atrial fibrillation after being found incidentally on EKG 12/26/13 during evaluation of gross hematuria, however when closely inspecting the original EKG there does appear to be P wave activity.   Currently in normal rhythm.   In review of prior up as noted from Dr. Jenny Reichmann on 08/07/15 he was feeling some lack of energy, seemed on steady, staggering, week, mildly confused at one point. Blood pressure was somewhat low at 110 at one point.  There was some concern again about interpretation of the EKG being atrial fibrillation on 08/07/15. I personally reviewed this EKG and there are P waves present proceeding. Complex complexes with irregular interval secondary to PACs once again.  He is now seeing Dr. Billey Gosling.  He is having no symptoms, no chest pain, no shortness of breath, no strokelike symptoms. He is diabetic. He is treated for hypertension.  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  He has been able to cut his own grass, he rests usually after with mild shortness of breath. Nothing significant he states. He still continues to smoke after several years. His brother was able to quit tobacco. He denies any significant snoring currently, his wife states that back when he worked hard he used to snore at night but she has not noted this recently. Denies any syncope, orthopnea. Does not feel any palpitations.   Wt Readings from Last 3 Encounters:  07/06/16 194 lb 9.6 oz (88.3 kg)  05/26/16 196 lb (88.9 kg)  11/26/15 190 lb 4  oz (86.3 kg)     Past Medical History:  Diagnosis Date  . Adenoma of left adrenal gland   . At risk for sleep apnea    STOP-BANG= 4    SENT TO PCP 02-06-2014  . Bladder cancer ([place]) 02/2014   local, low grade pap uro carcinoma  . BPH (benign prostatic hypertrophy)   . Diverticulosis of colon   . Full dentures   . GERD (gastroesophageal reflux disease)   . History of adenomatous polyp of colon   . History of diverticulitis of colon   . Hyperlipidemia   . Hypertension   . Murmur, cardiac   . PAC (premature atrial contraction)   . Type 2 diabetes mellitus ([place])     Past Surgical History:  Procedure Laterality Date  . ANTERIOR CERVICAL DECOMP/DISCECTOMY FUSION  05-24-2003   C4  --  C7  . COLONOSCOPY W/ POLYPECTOMY  last one 07-24-2013 w/ EGD  . CYSTOSCOPY W/ RETROGRADES Bilateral 02/09/2014   Procedure: BILATERAL RETROGRADE PYELOGRAM , Cystoscopy;  Surgeon: Ardis Hughs, MD;  Location: [facility];  Service: Urology;  Laterality: Bilateral;  . TRANSURETHRAL RESECTION OF BLADDER TUMOR WITH GYRUS (TURBT-GYRUS) N/A 02/09/2014   Procedure: Bladder Biopsies;  Surgeon: Ardis Hughs, MD;  Location: [facility];  Service: Urology;  Laterality: N/A;    Current Outpatient Prescriptions  Medication Sig Dispense Refill  . ACCU-CHEK AVIVA PLUS test strip Test  up to 4 times per day. DX E11.9 100 each 2  . ACCU-CHEK SOFTCLIX LANCETS lancets Test up to 4 times per day. DX E11.9 100 each 2  . aspirin 81 MG tablet Take 81 mg by mouth daily.      Marland Kitchen atorvastatin (LIPITOR) 20 MG tablet take 1 tablet by mouth once daily AT 6 PM 90 tablet 3  . blood glucose meter kit and supplies KIT Dispense based on patient and insurance preference. Use up to four times daily as directed. (FOR ICD-9 250.00, 250.01). 1 each 0  . CARTIA XT 180 MG 24 hr capsule take 1 capsule by mouth twice a day 180 capsule 3  . gabapentin (NEURONTIN) 100 MG capsule Take 1 capsule (100 mg total)  by mouth daily as needed. 30 capsule 3  . latanoprost (XALATAN) 0.005 % ophthalmic solution Place 1 drop into both eyes at bedtime.     Marland Kitchen lisinopril-hydrochlorothiazide (PRINZIDE,ZESTORETIC) 20-12.5 MG tablet take 1 tablet by mouth twice a day 180 tablet 3  . metFORMIN (GLUCOPHAGE) 500 MG tablet take 1 tablet by mouth twice a day with food 180 tablet 2  . potassium chloride SA (K-DUR,KLOR-CON) 20 MEQ tablet Take 1 tablet (20 mEq total) by mouth daily. 30 tablet 11  . terazosin (HYTRIN) 10 MG capsule take 1 capsule by mouth at bedtime 90 capsule 2   No current facility-administered medications for this visit.     Allergies:   No Known Allergies  Social History:  The patient  reports that he has been smoking Cigarettes.  He has a 20.00 pack-year smoking history. He has never used smokeless tobacco. He reports that he drinks alcohol. He reports that he does not use drugs.   Family History  Problem Relation Age of Onset  . Colon cancer Mother   . Hypertension Mother   . Hyperlipidemia Mother   . Hypertension Father   . Hyperlipidemia Father   . Diabetes Brother     ROS: Unless stated above, all other review of systems negative.  PHYSICAL EXAM: VS:  BP 138/64   Pulse 99   Ht 5' 7" (1.702 m)   Wt 194 lb 9.6 oz (88.3 kg)   SpO2 97%   BMI 30.48 kg/m  GEN: Well nourished, well developed, in no acute distress  HEENT: normal  Neck: no JVD, carotid bruits, or masses Cardiac: RRR with ectopy; no murmurs, rubs, or gallops,no edema  Respiratory:  clear to auscultation bilaterally, normal work of breathing GI: soft, nontender, nondistended, + BS MS: no deformity or atrophy  Skin: warm and dry, no rash Neuro:  Alert and Oriented x 3, Strength and sensation are intact Psych: euthymic mood, full affect   EKG:  EKG today on 07/06/16-sinus rhythm with premature atrial contractions noted P waves are noted. Ectopic atrial rhythm. 12/26/13-there is some baseline artifact present and I do see P  waves throughout EKG. There is a chance that this is sinus rhythm with premature atrial contractions, potentially paroxysmal atrial tachycardia and not atrial fibrillation or atrial flutter.    ECHO: 09/16/15 - Left ventricle: The cavity size was normal. There was moderate   focal basal hypertrophy. Systolic function was normal. The   estimated ejection fraction was in the range of 55% to 60%. There   is akinesis of the basalinferior myocardium. There was an   increased relative contribution of atrial contraction to   ventricular filling. Doppler parameters are consistent with   abnormal left ventricular relaxation (grade 1 diastolic  dysfunction). - Left atrium: The atrium was mildly dilated. - Right ventricle: The cavity size was moderately dilated. Wall   thickness was normal. - Right atrium: The atrium was mildly dilated.  01/24/14-sinus rhythm with premature atrial complexes, heart rate 90, left axis deviation  ASSESSMENT AND PLAN:  Premature atrial contractions resulting in irregular appearing EKG  - reviewed EKG from 12/26/13 and there are P wave activity. Once again this does not represent atrial fibrillation. The irregularity noted on ECG is secondary to PACs. No anticoagulation.  - Sometimes we see multifocal atrial tachycardia in the setting of underlying lung disease such as COPD or long-standing smoking. If heart rate increases significantly, we usually treat this with diltiazem which he is already taking.  - Prior echocardiogram showed akinesis of the basal inferior myocardium with normal ejection fraction. This is often seen and often is not representative of any prior myocardial infarction. Personally reviewed echocardiogram and in multiple views, I do see movement in the basal inferior wall. Moderate LVH was noted which should be treated with continued antihypertensive therapy. There was mild basal septal hypertrophy noted that overall no significant moderate LVH  concentrically. His right ventricle also being dilated can be from underlying sleep apnea perhaps or smoking, obese. I do not see any evidence of elevated pulmonary pressures on echocardiogram. No evidence of ASD noted. One could consider sleep study in the future or PFTs given his long-standing smoking history. Dr. Burns previously talked to him about possible sleep apnea and he deferred sleep study.  Hematuria/bladder cancer  - Had been seeing urology.  diabetes with hypertension  -per primary team, stable, no changes  Hypertension -well controlled    Tobacco  Signed,  , MD FACC  07/06/2016 10:07 AM     

## 2016-07-06 NOTE — Patient Instructions (Signed)
Medication Instructions:  The current medical regimen is effective;  continue present plan and medications.  Follow-Up: Follow up as needed.  Thank you for choosing [facility]!!     

## 2016-07-10 LAB — HM DIABETES EYE EXAM

## 2016-07-16 ENCOUNTER — Encounter: Payer: Self-pay | Admitting: Internal Medicine

## 2016-11-17 ENCOUNTER — Other Ambulatory Visit: Payer: Self-pay | Admitting: Internal Medicine

## 2016-11-25 NOTE — Patient Instructions (Addendum)
  Test(s) ordered today. Your results will be released to MyChart (or called to you) after review, usually within 72hours after test completion. If any changes need to be made, you will be notified at that same time.  All other Health Maintenance issues reviewed.   All recommended immunizations and age-appropriate screenings are up-to-date or discussed.  Flu immunization administered today.   Medications reviewed and updated.  No changes recommended at this time.  Your prescription(s) have been submitted to your pharmacy. Please take as directed and contact our office if you believe you are having problem(s) with the medication(s).   Please followup in 6 months   

## 2016-11-25 NOTE — Progress Notes (Signed)
Subjective:    Patient ID: Colin Love, male    DOB: 05/19/1945, 71 y.o.   MRN: [id number]  HPI The patient is here for follow up.  Hypertension: He is taking his medication daily. He is compliant with a low sodium diet.  He denies chest pain, palpitations, edema, shortness of breath and regular headaches. He is not exercising regularly.    Diabetes: He is taking his medication daily as prescribed. He is compliant with a diabetic diet. He is not exercising regularly. He monitors his sugars and they have been running 96-122. He checks his feet daily and denies foot lesions. He is up-to-date with an ophthalmology examination.   Burning sensation in feet, ? Diabetic neuropathy:  He was prescribed  Gabapentin.  The sensation occurs only occasionally.   He is not taking the medication now and is unsure if he ever took it.   Hyperlipidemia: He is taking his medication daily. He is compliant with a low fat/cholesterol diet. He is not exercising regularly. He denies myalgias.    Medications and allergies reviewed with patient and updated if appropriate.  Patient Active Problem List   Diagnosis Date Noted  . Abnormal echocardiogram 05/26/2016  . Burning sensation of feet 05/26/2016  . Dizziness and giddiness 08/07/2015  . Sudden right hearing loss 08/07/2015  . Irregular heart rhythm 08/07/2015  . Glaucoma 05/21/2015  . PAC (premature atrial contraction) 01/24/2014  . Bladder cancer ([place]) 01/09/2014  . Smoker   . Diabetes type 2, controlled ([place]) 12/23/2009  . Dyslipidemia 12/23/2009  . Essential hypertension 12/23/2009  . COLONIC POLYPS, HX OF 12/23/2009  . DIVERTICULITIS, HX OF 12/23/2009    Current Outpatient Prescriptions on File Prior to Visit  Medication Sig Dispense Refill  . ACCU-CHEK AVIVA PLUS test strip Test up to 4 times per day. DX E11.9 100 each 2  . ACCU-CHEK SOFTCLIX LANCETS lancets Test up to 4 times per day. DX E11.9 100 each 2  . aspirin 81 MG tablet Take 81  mg by mouth daily.      Marland Kitchen atorvastatin (LIPITOR) 20 MG tablet take 1 tablet by mouth once daily AT 6 PM 90 tablet 3  . blood glucose meter kit and supplies KIT Dispense based on patient and insurance preference. Use up to four times daily as directed. (FOR ICD-9 250.00, 250.01). 1 each 0  . CARTIA XT 180 MG 24 hr capsule take 1 capsule by mouth twice a day 180 capsule 3  . gabapentin (NEURONTIN) 100 MG capsule Take 1 capsule (100 mg total) by mouth daily as needed. 30 capsule 3  . latanoprost (XALATAN) 0.005 % ophthalmic solution Place 1 drop into both eyes at bedtime.     Marland Kitchen lisinopril-hydrochlorothiazide (PRINZIDE,ZESTORETIC) 20-12.5 MG tablet take 1 tablet by mouth twice a day 180 tablet 3  . metFORMIN (GLUCOPHAGE) 500 MG tablet take 1 tablet by mouth twice a day with food 60 tablet 0  . potassium chloride SA (K-DUR,KLOR-CON) 20 MEQ tablet Take 1 tablet (20 mEq total) by mouth daily. 30 tablet 11  . terazosin (HYTRIN) 10 MG capsule take 1 capsule by mouth at bedtime 30 capsule 0   No current facility-administered medications on file prior to visit.     Past Medical History:  Diagnosis Date  . Adenoma of left adrenal gland   . At risk for sleep apnea    STOP-BANG= 4    SENT TO PCP 02-06-2014  . Bladder cancer ([place]) 02/2014   local, low grade  pap uro carcinoma  . BPH (benign prostatic hypertrophy)   . Diverticulosis of colon   . Full dentures   . GERD (gastroesophageal reflux disease)   . History of adenomatous polyp of colon   . History of diverticulitis of colon   . Hyperlipidemia   . Hypertension   . Murmur, cardiac   . PAC (premature atrial contraction)   . Type 2 diabetes mellitus ([place])     Past Surgical History:  Procedure Laterality Date  . ANTERIOR CERVICAL DECOMP/DISCECTOMY FUSION  05-24-2003   C4  --  C7  . COLONOSCOPY W/ POLYPECTOMY  last one 07-24-2013 w/ EGD  . CYSTOSCOPY W/ RETROGRADES Bilateral 02/09/2014   Procedure: BILATERAL RETROGRADE PYELOGRAM , Cystoscopy;   Surgeon: Ardis Hughs, MD;  Location: [facility];  Service: Urology;  Laterality: Bilateral;  . TRANSURETHRAL RESECTION OF BLADDER TUMOR WITH GYRUS (TURBT-GYRUS) N/A 02/09/2014   Procedure: Bladder Biopsies;  Surgeon: Ardis Hughs, MD;  Location: [facility];  Service: Urology;  Laterality: N/A;    Social History   Social History  . Marital status: Married    Spouse name: N/A  . Number of children: N/A  . Years of education: N/A   Social History Main Topics  . Smoking status: Current Every Day Smoker    Packs/day: 0.50    Years: 40.00    Types: Cigarettes  . Smokeless tobacco: Never Used  . Alcohol use Yes     Comment: OCCASIONAL  . Drug use: No  . Sexual activity: Not Asked   Other Topics Concern  . None   Social History Narrative  . None    Family History  Problem Relation Age of Onset  . Colon cancer Mother   . Hypertension Mother   . Hyperlipidemia Mother   . Hypertension Father   . Hyperlipidemia Father   . Diabetes Brother     Review of Systems  Constitutional: Negative for chills and fever.  Eyes: Negative for visual disturbance.  Respiratory: Positive for cough (occ). Negative for shortness of breath and wheezing.   Cardiovascular: Negative for chest pain, palpitations and leg swelling.  Neurological: Negative for light-headedness and headaches.       Objective:   Vitals:   11/26/16 0853  BP: 116/70  Pulse: 80  Resp: 16  Temp: 98.3 F (36.8 C)  SpO2: 97%   Wt Readings from Last 3 Encounters:  11/26/16 190 lb 12.8 oz (86.5 kg)  07/06/16 194 lb 9.6 oz (88.3 kg)  05/26/16 196 lb (88.9 kg)   Body mass index is 29.88 kg/m.   Physical Exam    Constitutional: Appears well-developed and well-nourished. No distress.  HENT:  Head: Normocephalic and atraumatic.  Neck: Neck supple. No tracheal deviation present. No thyromegaly present.  No cervical lymphadenopathy Cardiovascular: Normal rate, irregular  rhythm and normal heart sounds.   No murmur heard. No carotid bruit .  No edema Pulmonary/Chest: Effort normal and breath sounds normal. No respiratory distress. No has no wheezes. No rales.  Skin: Skin is warm and dry. Not diaphoretic.  Psychiatric: Normal mood and affect. Behavior is normal.      Assessment & Plan:    See Problem List for Assessment and Plan of chronic medical problems.

## 2016-11-26 ENCOUNTER — Encounter: Payer: Self-pay | Admitting: Internal Medicine

## 2016-11-26 ENCOUNTER — Other Ambulatory Visit (INDEPENDENT_AMBULATORY_CARE_PROVIDER_SITE_OTHER): Payer: Medicare PPO

## 2016-11-26 ENCOUNTER — Ambulatory Visit (INDEPENDENT_AMBULATORY_CARE_PROVIDER_SITE_OTHER): Payer: Medicare PPO | Admitting: Internal Medicine

## 2016-11-26 VITALS — BP 116/70 | HR 80 | Temp 98.3°F | Resp 16 | Ht 67.0 in | Wt 190.8 lb

## 2016-11-26 DIAGNOSIS — I10 Essential (primary) hypertension: Secondary | ICD-10-CM | POA: Diagnosis not present

## 2016-11-26 DIAGNOSIS — Z23 Encounter for immunization: Secondary | ICD-10-CM

## 2016-11-26 DIAGNOSIS — E119 Type 2 diabetes mellitus without complications: Secondary | ICD-10-CM

## 2016-11-26 DIAGNOSIS — I499 Cardiac arrhythmia, unspecified: Secondary | ICD-10-CM

## 2016-11-26 DIAGNOSIS — E785 Hyperlipidemia, unspecified: Secondary | ICD-10-CM | POA: Diagnosis not present

## 2016-11-26 DIAGNOSIS — R208 Other disturbances of skin sensation: Secondary | ICD-10-CM | POA: Diagnosis not present

## 2016-11-26 LAB — COMPREHENSIVE METABOLIC PANEL WITH GFR
ALT: 10 U/L (ref 0–53)
AST: 14 U/L (ref 0–37)
Albumin: 4.3 g/dL (ref 3.5–5.2)
Alkaline Phosphatase: 60 U/L (ref 39–117)
BUN: 23 mg/dL (ref 6–23)
CO2: 28 meq/L (ref 19–32)
Calcium: 10.3 mg/dL (ref 8.4–10.5)
Chloride: 101 meq/L (ref 96–112)
Creatinine, Ser: 1.03 mg/dL (ref 0.40–1.50)
GFR: 91.31 mL/min
Glucose, Bld: 125 mg/dL — ABNORMAL HIGH (ref 70–99)
Potassium: 3.9 meq/L (ref 3.5–5.1)
Sodium: 137 meq/L (ref 135–145)
Total Bilirubin: 0.6 mg/dL (ref 0.2–1.2)
Total Protein: 7.2 g/dL (ref 6.0–8.3)

## 2016-11-26 LAB — HEMOGLOBIN A1C: Hgb A1c MFr Bld: 6.3 % (ref 4.6–6.5)

## 2016-11-26 NOTE — Assessment & Plan Note (Signed)
Not taking gabapentin  Has burning sensation only occasionally -- gabapentin not needed No treatment at this time - will monitor

## 2016-11-26 NOTE — Assessment & Plan Note (Signed)
Check a1c Restart regular exercise continue current medication

## 2016-11-26 NOTE — Assessment & Plan Note (Signed)
BP well controlled Current regimen effective and well tolerated Continue current medications at current doses cmp  

## 2016-11-26 NOTE — Assessment & Plan Note (Signed)
Check lipid panel  Continue daily statin Regular exercise and healthy diet encouraged  

## 2016-11-26 NOTE — Assessment & Plan Note (Addendum)
Irregular rhythm on exam Has seen cardiology - PACs on ekg - not Afib ? Lung disease or COPD Deferred sleep study for now, no concerning symptoms of possible COPD Will monitor

## 2016-12-25 ENCOUNTER — Other Ambulatory Visit: Payer: Self-pay | Admitting: Internal Medicine

## 2017-01-05 ENCOUNTER — Other Ambulatory Visit: Payer: Self-pay | Admitting: Internal Medicine

## 2017-04-02 ENCOUNTER — Telehealth: Payer: Self-pay | Admitting: Internal Medicine

## 2017-04-02 MED ORDER — POTASSIUM CHLORIDE CRYS ER 20 MEQ PO TBCR: 20.0000 meq | EXTENDED_RELEASE_TABLET | Freq: Every day | ORAL | 1 refills | Status: DC

## 2017-04-02 NOTE — Telephone Encounter (Signed)
Copied from [place]. Topic: Quick Communication - Rx Refill/Question >> Apr 02, 2017 10:36 AM Aurelio Brash B wrote: Medication:potassium chloride SA (K-DUR,KLOR-CON) 20 MEQ tablet  Pt is asking for 90 day supply   Has the patient contacted their pharmacy? yes   (Agent: If no, request that the patient contact the pharmacy for the refill.)   Preferred Pharmacy (with phone number or street name):[id number] [address], [place]. [phone] (Phone) [phone] (Fax)     Agent: Please be advised that RX refills may take up to 3 business days. We ask that you follow-up with your pharmacy.

## 2017-04-02 NOTE — Telephone Encounter (Signed)
Reviewed chart pt is up-to-date [facility]  

## 2017-04-02 NOTE — Telephone Encounter (Signed)
Pt  Requesting  A  Refill of   Potassium  Cl   20  Meq       LOV 11/26/2016      LAST REFILL   06/01/2016        Last   [facility]   11/26/2016     [facility]armacy  [facility]  [facility]  [facility]  NUMBER [place]

## 2017-05-26 NOTE — Progress Notes (Signed)
Subjective:    Patient ID: Colin Love, male    DOB: [birth year], 72 y.o.   MRN: [id number]  HPI The patient is here for follow up.  Hypertension: She is taking her medication daily. She is compliant with a low sodium diet.  She denies chest pain, palpitations, edema, shortness of breath and regular headaches. She is exercising irregularly.  She does not monitor her blood pressure at home.    Diabetes: She is taking her medication daily as prescribed. She is compliant with a diabetic diet. She is exercising riregularly. She monitors her sugars and they have been running 110-140. She checks her feet daily and denies foot lesions. She is up-to-date with an ophthalmology examination.   Hyperlipidemia: She is taking her medication daily. She is compliant with a low fat/cholesterol diet. She is exercising irregularly. She denies myalgias.     Medications and allergies reviewed with patient and updated if appropriate.  Patient Active Problem List   Diagnosis Date Noted  . Abnormal echocardiogram 05/26/2016  . Burning sensation of feet 05/26/2016  . Dizziness and giddiness 08/07/2015  . Sudden right hearing loss 08/07/2015  . Irregular heart rhythm 08/07/2015  . Glaucoma 05/21/2015  . PAC (premature atrial contraction) 01/24/2014  . Bladder cancer ([place]) 01/09/2014  . Smoker   . Diabetes type 2, controlled ([place]) 12/23/2009  . Dyslipidemia 12/23/2009  . Essential hypertension 12/23/2009  . COLONIC POLYPS, HX OF 12/23/2009  . DIVERTICULITIS, HX OF 12/23/2009    Current Outpatient Medications on File Prior to Visit  Medication Sig Dispense Refill  . ACCU-CHEK AVIVA PLUS test strip TEST UP TO 4 TIMES A DAY 300 each 1  . ACCU-CHEK SOFTCLIX LANCETS lancets TEST UP TO 4 TIMES A DAY 300 each 1  . aspirin 81 MG tablet Take 81 mg by mouth daily.      Marland Kitchen atorvastatin (LIPITOR) 20 MG tablet take 1 tablet by mouth once daily AT 6 PM 90 tablet 3  . blood glucose meter kit and supplies KIT  Dispense based on patient and insurance preference. Use up to four times daily as directed. (FOR ICD-9 250.00, 250.01). 1 each 0  . diltiazem (CARDIZEM CD) 180 MG 24 hr capsule take 1 capsule by mouth twice a day 180 capsule 3  . latanoprost (XALATAN) 0.005 % ophthalmic solution Place 1 drop into both eyes at bedtime.     Marland Kitchen lisinopril-hydrochlorothiazide (PRINZIDE,ZESTORETIC) 20-12.5 MG tablet take 1 tablet by mouth twice a day 180 tablet 3  . metFORMIN (GLUCOPHAGE) 500 MG tablet take 1 tablet by mouth twice a day with food 180 tablet 1  . potassium chloride SA (K-DUR,KLOR-CON) 20 MEQ tablet Take 1 tablet (20 mEq total) by mouth daily. 90 tablet 1  . terazosin (HYTRIN) 10 MG capsule take 1 capsule by mouth at bedtime 90 capsule 1   No current facility-administered medications on file prior to visit.     Past Medical History:  Diagnosis Date  . Adenoma of left adrenal gland   . At risk for sleep apnea    STOP-BANG= 4    SENT TO PCP 02-06-2014  . Bladder cancer ([place]) 02/2014   local, low grade pap uro carcinoma  . BPH (benign prostatic hypertrophy)   . Diverticulosis of colon   . Full dentures   . GERD (gastroesophageal reflux disease)   . History of adenomatous polyp of colon   . History of diverticulitis of colon   . Hyperlipidemia   . Hypertension   .  Murmur, cardiac   . PAC (premature atrial contraction)   . Type 2 diabetes mellitus ([place])     Past Surgical History:  Procedure Laterality Date  . ANTERIOR CERVICAL DECOMP/DISCECTOMY FUSION  05-24-2003   C4  --  C7  . COLONOSCOPY W/ POLYPECTOMY  last one 07-24-2013 w/ EGD  . CYSTOSCOPY W/ RETROGRADES Bilateral 02/09/2014   Procedure: BILATERAL RETROGRADE PYELOGRAM , Cystoscopy;  Surgeon: Ardis Hughs, MD;  Location: [facility];  Service: Urology;  Laterality: Bilateral;  . TRANSURETHRAL RESECTION OF BLADDER TUMOR WITH GYRUS (TURBT-GYRUS) N/A 02/09/2014   Procedure: Bladder Biopsies;  Surgeon: Ardis Hughs, MD;  Location: [facility];  Service: Urology;  Laterality: N/A;    Social History   Socioeconomic History  . Marital status: Married    Spouse name: Not on file  . Number of children: Not on file  . Years of education: Not on file  . Highest education level: Not on file  Occupational History  . Not on file  Social Needs  . Financial resource strain: Not hard at all  . Food insecurity:    Worry: Never true    Inability: Never true  . Transportation needs:    Medical: No    Non-medical: No  Tobacco Use  . Smoking status: Current Every Day Smoker    Packs/day: 0.50    Years: 40.00    Pack years: 20.00    Types: Cigarettes  . Smokeless tobacco: Never Used  Substance and Sexual Activity  . Alcohol use: Yes    Comment: OCCASIONAL  . Drug use: No  . Sexual activity: Not Currently  Lifestyle  . Physical activity:    Days per week: 0 days    Minutes per session: 0 min  . Stress: Not at all  Relationships  . Social connections:    Talks on phone: More than three times a week    Gets together: More than three times a week    Attends religious service: More than 4 times per year    Active member of club or organization: Yes    Attends meetings of clubs or organizations: More than 4 times per year    Relationship status: Married  Other Topics Concern  . Not on file  Social History Narrative  . Not on file    Family History  Problem Relation Age of Onset  . Colon cancer Mother   . Hypertension Mother   . Hyperlipidemia Mother   . Hypertension Father   . Hyperlipidemia Father   . Diabetes Brother     Review of Systems  Constitutional: Negative for chills and fever.  Respiratory: Negative for cough, shortness of breath and wheezing.   Cardiovascular: Negative for chest pain, palpitations and leg swelling.  Neurological: Negative for light-headedness and headaches.       Objective:   Vitals:   05/27/17 1043  BP: 138/72  Pulse: 65    Resp: 16  Temp: 98 F (36.7 C)  SpO2: 99%   BP Readings from Last 3 Encounters:  05/27/17 137/72  05/27/17 138/72  11/26/16 116/70   Wt Readings from Last 3 Encounters:  05/27/17 195 lb (88.5 kg)  05/27/17 195 lb (88.5 kg)  11/26/16 190 lb 12.8 oz (86.5 kg)   Body mass index is 30.54 kg/m.   Physical Exam    Constitutional: Appears well-developed and well-nourished. No distress.  HENT:  Head: Normocephalic and atraumatic.  Neck: Neck supple. No tracheal deviation present.  No thyromegaly present.  No cervical lymphadenopathy Cardiovascular: Normal rate, regular rhythm and normal heart sounds.   No murmur heard. No carotid bruit .  No edema Pulmonary/Chest: Effort normal and breath sounds normal. No respiratory distress. No has no wheezes. No rales.  Skin: Skin is warm and dry. Not diaphoretic.  Psychiatric: Normal mood and affect. Behavior is normal.      Assessment & Plan:    See Problem List for Assessment and Plan of chronic medical problems.

## 2017-05-27 ENCOUNTER — Encounter: Payer: Self-pay | Admitting: Internal Medicine

## 2017-05-27 ENCOUNTER — Other Ambulatory Visit (INDEPENDENT_AMBULATORY_CARE_PROVIDER_SITE_OTHER): Payer: Medicare HMO

## 2017-05-27 ENCOUNTER — Ambulatory Visit (INDEPENDENT_AMBULATORY_CARE_PROVIDER_SITE_OTHER): Payer: Medicare HMO | Admitting: Internal Medicine

## 2017-05-27 ENCOUNTER — Ambulatory Visit (INDEPENDENT_AMBULATORY_CARE_PROVIDER_SITE_OTHER): Payer: Medicare HMO | Admitting: *Deleted

## 2017-05-27 VITALS — BP 138/72 | HR 65 | Temp 98.0°F | Resp 16 | Ht 67.0 in | Wt 195.0 lb

## 2017-05-27 VITALS — BP 137/72 | HR 65 | Temp 98.0°F | Resp 18 | Ht 67.0 in | Wt 195.0 lb

## 2017-05-27 DIAGNOSIS — E785 Hyperlipidemia, unspecified: Secondary | ICD-10-CM | POA: Diagnosis not present

## 2017-05-27 DIAGNOSIS — I10 Essential (primary) hypertension: Secondary | ICD-10-CM

## 2017-05-27 DIAGNOSIS — I499 Cardiac arrhythmia, unspecified: Secondary | ICD-10-CM

## 2017-05-27 DIAGNOSIS — E119 Type 2 diabetes mellitus without complications: Secondary | ICD-10-CM

## 2017-05-27 DIAGNOSIS — Z0000 Encounter for general adult medical examination without abnormal findings: Secondary | ICD-10-CM

## 2017-05-27 LAB — COMPREHENSIVE METABOLIC PANEL WITH GFR
ALT: 13 U/L (ref 0–53)
AST: 16 U/L (ref 0–37)
Albumin: 4.5 g/dL (ref 3.5–5.2)
Alkaline Phosphatase: 65 U/L (ref 39–117)
BUN: 18 mg/dL (ref 6–23)
CO2: 26 meq/L (ref 19–32)
Calcium: 10.6 mg/dL — ABNORMAL HIGH (ref 8.4–10.5)
Chloride: 101 meq/L (ref 96–112)
Creatinine, Ser: 0.9 mg/dL (ref 0.40–1.50)
GFR: 106.54 mL/min
Glucose, Bld: 128 mg/dL — ABNORMAL HIGH (ref 70–99)
Potassium: 3.8 meq/L (ref 3.5–5.1)
Sodium: 138 meq/L (ref 135–145)
Total Bilirubin: 0.4 mg/dL (ref 0.2–1.2)
Total Protein: 7.1 g/dL (ref 6.0–8.3)

## 2017-05-27 LAB — LIPID PANEL
Cholesterol: 121 mg/dL (ref 0–200)
HDL: 49.9 mg/dL
LDL Cholesterol: 54 mg/dL (ref 0–99)
NonHDL: 70.87
Total CHOL/HDL Ratio: 2
Triglycerides: 85 mg/dL (ref 0.0–149.0)
VLDL: 17 mg/dL (ref 0.0–40.0)

## 2017-05-27 LAB — CBC WITH DIFFERENTIAL/PLATELET
Basophils Absolute: 0 10*3/uL (ref 0.0–0.1)
Basophils Relative: 0.7 % (ref 0.0–3.0)
Eosinophils Absolute: 0.1 10*3/uL (ref 0.0–0.7)
Eosinophils Relative: 0.9 % (ref 0.0–5.0)
HCT: 44 % (ref 39.0–52.0)
Hemoglobin: 14.6 g/dL (ref 13.0–17.0)
Lymphocytes Relative: 15.7 % (ref 12.0–46.0)
Lymphs Abs: 1.2 10*3/uL (ref 0.7–4.0)
MCHC: 33.3 g/dL (ref 30.0–36.0)
MCV: 86.5 fl (ref 78.0–100.0)
Monocytes Absolute: 0.6 10*3/uL (ref 0.1–1.0)
Monocytes Relative: 8.4 % (ref 3.0–12.0)
Neutro Abs: 5.4 10*3/uL (ref 1.4–7.7)
Neutrophils Relative %: 74.3 % (ref 43.0–77.0)
Platelets: 167 10*3/uL (ref 150.0–400.0)
RBC: 5.08 Mil/uL (ref 4.22–5.81)
RDW: 14.5 % (ref 11.5–15.5)
WBC: 7.3 10*3/uL (ref 4.0–10.5)

## 2017-05-27 LAB — HEMOGLOBIN A1C: Hgb A1c MFr Bld: 6.4 % (ref 4.6–6.5)

## 2017-05-27 LAB — TSH: TSH: 1.2 u[IU]/mL (ref 0.35–4.50)

## 2017-05-27 NOTE — Assessment & Plan Note (Signed)
Sugars well controlled at home Check a1c Low sugar / carb diet Stressed regular exercise

## 2017-05-27 NOTE — Patient Instructions (Signed)
  Test(s) ordered today. Your results will be released to MyChart (or called to you) after review, usually within 72hours after test completion. If any changes need to be made, you will be notified at that same time.  Medications reviewed and updated.  No changes recommended at this time.    Please followup in 6 months   

## 2017-05-27 NOTE — Assessment & Plan Note (Signed)
Check lipid panel  Continue daily statin Regular exercise and healthy diet encouraged  

## 2017-05-27 NOTE — Progress Notes (Addendum)
Subjective:   Colin Love is a 72 y.o. male who presents for Medicare Annual/Subsequent preventive examination.  Review of Systems:  No ROS.  Medicare Wellness Visit. Additional risk factors are reflected in the social history.  Cardiac Risk Factors include: advanced age (>65mn, >>5women);diabetes mellitus;dyslipidemia;hypertension;male gender Sleep patterns: feels rested on waking, gets up 1 times nightly to void and sleeps 6-7 hours nightly.    Home Safety/Smoke Alarms: Feels safe in home. Smoke alarms in place.  Living environment; residence and Firearm Safety: 1-story house/ trailer, no firearms., Lives with wife, no needs for DME, good support system Seat Belt Safety/Bike Helmet: Wears seat belt.     Objective:    Vitals: BP 137/72   Pulse 65   Temp 98 F (36.7 C)   Resp 18   Ht 5' 7"  (1.702 m)   Wt 195 lb (88.5 kg)   SpO2 99%   BMI 30.54 kg/m   Body mass index is 30.54 kg/m.  Advanced Directives 05/27/2017 11/26/2015 08/29/2015 02/09/2014  Does Patient Have a Medical Advance Directive? Yes Yes No;Yes Yes  Type of AParamedicof [place] will - - Living will  Does patient want to make changes to medical advance directive? - - - No - Patient declined  [place] of [place]? No - copy requested - - No - copy requested  Would patient like information on creating a medical advance directive? - - No - patient declined information -    Tobacco Social History   Tobacco Use  Smoking Status Current Every Day Smoker  . Packs/day: 0.50  . Years: 40.00  . Pack years: 20.00  . Types: Cigarettes  Smokeless Tobacco Never Used     Ready to quit: Not Answered Counseling given: Not Answered  Past Medical History:  Diagnosis Date  . Adenoma of left adrenal gland   . At risk for sleep apnea    STOP-BANG= 4    SENT TO PCP 02-06-2014  . [place]   local, low grade pap uro carcinoma  . BPH (benign  prostatic hypertrophy)   . Diverticulosis of colon   . Full dentures   . GERD (gastroesophageal reflux disease)   . History of adenomatous polyp of colon   . History of diverticulitis of colon   . Hyperlipidemia   . Hypertension   . Murmur, cardiac   . PAC (premature atrial contraction)   . Type 2 diabetes mellitus ([facility]    Past Surgical History:  Procedure Laterality Date  . ANTERIOR CERVICAL DECOMP/DISCECTOMY FUSION  05-24-2003   C4  --  C7  . COLONOSCOPY W/ POLYPECTOMY  last one 07-24-2013 w/ EGD  . CYSTOSCOPY W/ RETROGRADES Bilateral 02/09/2014   Procedure: BILATERAL RETROGRADE PYELOGRAM , Cystoscopy;  Surgeon: BArdis Hughs MD;  [facility]  Service: Urology;  Laterality: Bilateral;  . TRANSURETHRAL RESECTION OF BLADDER TUMOR WITH GYRUS (TURBT-GYRUS) N/A 02/09/2014   Procedure: Bladder Biopsies;  Surgeon: BArdis Hughs MD;  [facility]  Service: Urology;  Laterality: N/A;   Family History  Problem Relation Age of Onset  . Colon cancer Mother   . Hypertension Mother   . Hyperlipidemia Mother   . Hypertension Father   . Hyperlipidemia Father   . Diabetes Brother    Social History   Socioeconomic History  . Marital status: Married    Spouse name: Not on file  . Number of children: Not  on file  . Years of education: Not on file  . Highest education level: Not on file  Occupational History  . Not on file  Social Needs  . Financial resource strain: Not hard at all  . Food insecurity:    Worry: Never true    Inability: Never true  . Transportation needs:    Medical: No    Non-medical: No  Tobacco Use  . Smoking status: Current Every Day Smoker    Packs/day: 0.50    Years: 40.00    Pack years: 20.00    Types: Cigarettes  . Smokeless tobacco: Never Used  Substance and Sexual Activity  . Alcohol use: Yes    Comment: OCCASIONAL  . Drug use: No  . Sexual activity: Not Currently  Lifestyle  . Physical  activity:    Days per week: 0 days    Minutes per session: 0 min  . Stress: Not at all  Relationships  . Social connections:    Talks on phone: More than three times a week    Gets together: More than three times a week    Attends religious service: More than 4 times per year    Active member of club or organization: Yes    Attends meetings of clubs or organizations: More than 4 times per year    Relationship status: Married  Other Topics Concern  . Not on file  Social History Narrative  . Not on file    Outpatient Encounter Medications as of 05/27/2017  Medication Sig  . ACCU-CHEK AVIVA PLUS test strip TEST UP TO 4 TIMES A DAY  . ACCU-CHEK SOFTCLIX LANCETS lancets TEST UP TO 4 TIMES A DAY  . aspirin 81 MG tablet Take 81 mg by mouth daily.    Marland Kitchen atorvastatin (LIPITOR) 20 MG tablet take 1 tablet by mouth once daily AT 6 PM  . blood glucose meter kit and supplies KIT Dispense based on patient and insurance preference. Use up to four times daily as directed. (FOR ICD-9 250.00, 250.01).  Marland Kitchen diltiazem (CARDIZEM CD) 180 MG 24 hr capsule take 1 capsule by mouth twice a day  . latanoprost (XALATAN) 0.005 % ophthalmic solution Place 1 drop into both eyes at bedtime.   Marland Kitchen lisinopril-hydrochlorothiazide (PRINZIDE,ZESTORETIC) 20-12.5 MG tablet take 1 tablet by mouth twice a day  . metFORMIN (GLUCOPHAGE) 500 MG tablet take 1 tablet by mouth twice a day with food  . potassium chloride SA (K-DUR,KLOR-CON) 20 MEQ tablet Take 1 tablet (20 mEq total) by mouth daily.  Marland Kitchen terazosin (HYTRIN) 10 MG capsule take 1 capsule by mouth at bedtime   No facility-administered encounter medications on file as of 05/27/2017.     Activities of Daily Living In your present state of health, do you have any difficulty performing the following activities: 05/27/2017  Hearing? N  Vision? N  Difficulty concentrating or making decisions? N  Walking or climbing stairs? N  Dressing or bathing? N  Doing errands, shopping? N    Preparing Food and eating ? N  Using the Toilet? N  In the past six months, have you accidently leaked urine? N  Do you have problems with loss of bowel control? N  Managing your Medications? N  Managing your Finances? N  Housekeeping or managing your Housekeeping? N  Some recent data might be hidden    Patient Care Team: Binnie Rail, MD as PCP - General (Internal Medicine) Juanita Craver, MD as Consulting Physician (Gastroenterology) Ardis Hughs, MD (  Urology) Jerline Pain, MD (Cardiology)   Assessment:   This is a routine wellness examination for Barney. Physical assessment deferred to PCP.   Exercise Activities and Dietary recommendations Current Exercise Habits: The patient does not participate in regular exercise at present, Exercise limited by: None identified  Diet (meal preparation, eat out, water intake, caffeinated beverages, dairy products, fruits and vegetables): in general, a "healthy" diet  , well balanced,eats a variety of fruits and vegetables daily, limits salt, fat/cholesterol, sugar,carbohydrates,caffeine.    Reviewed heart healthy and diabetic diet, encouraged patient to increase daily water intake. Diet education was attached to patient's AVS.  Goals    . Patient Stated     Increase my physical activity by starting to walk twice a week.        Fall Risk Fall Risk  05/27/2017 11/26/2016 11/26/2015 05/21/2015 01/09/2014  Falls in the past year? No No No No No     Depression Screen PHQ 2/9 Scores 05/27/2017 11/26/2016 11/26/2015 05/21/2015  PHQ - 2 Score 1 0 0 0  PHQ- 9 Score 1 - - -    Cognitive Function MMSE - Mini Mental State Exam 05/27/2017 11/26/2015  Not completed: - (No Data)  Orientation to time 5 -  Orientation to Place 5 -  Registration 3 -  Attention/ Calculation 5 -  Recall 3 -  Language- name 2 objects 2 -  Language- repeat 1 -  Language- follow 3 step command 3 -  Language- read & follow direction 1 -  Write a sentence 1 -   Copy design 1 -  Total score 30 -        Immunization History  Administered Date(s) Administered  . Influenza Split 03/20/2011  . Influenza, High Dose Seasonal PF 01/30/2013, 01/09/2014, 11/13/2014, 11/26/2015, 11/26/2016  . Influenza, Seasonal, Injecte, Preservative Fre 03/17/2012  . Pneumococcal Conjugate-13 11/13/2014  . Pneumococcal Polysaccharide-23 03/20/2011  . Tdap 07/02/2010   Screening Tests Health Maintenance  Topic Date Due  . HEMOGLOBIN A1C  05/26/2017  . OPHTHALMOLOGY EXAM  07/10/2017  . FOOT EXAM  05/28/2018  . TETANUS/TDAP  07/01/2020  . COLONOSCOPY  07/24/2020  . INFLUENZA VACCINE  Completed  . Hepatitis C Screening  Completed  . PNA vac Low Risk Adult  Completed      Plan:    Continue doing brain stimulating activities (puzzles, reading, adult coloring books, staying active) to keep memory sharp.   Continue to eat heart healthy diet (full of fruits, vegetables, whole grains, lean protein, water--limit salt, fat, and sugar intake) and increase physical activity as tolerated.  I have personally reviewed and noted the following in the patient's chart:   . Medical and social history . Use of alcohol, tobacco or illicit drugs  . Current medications and supplements . Functional ability and status . Nutritional status . Physical activity . Advanced directives . List of other physicians . Vitals . Screenings to include cognitive, depression, and falls . Referrals and appointments  In addition, I have reviewed and discussed with patient certain preventive protocols, quality metrics, and best practice recommendations. A written personalized care plan for preventive services as well as general preventive health recommendations were provided to patient.     Michiel Cowboy, RN  05/27/2017    Medical screening examination/treatment/procedure(s) were performed by non-physician practitioner and as supervising physician I was immediately available for  consultation/collaboration. I agree with above. Binnie Rail, MD

## 2017-05-27 NOTE — Assessment & Plan Note (Signed)
BP well controlled Current regimen effective and well tolerated Continue current medications at current doses Cmp, cbc, tsh 

## 2017-05-27 NOTE — Assessment & Plan Note (Signed)
Controlled Tsh, cbc, cmp

## 2017-05-27 NOTE — Patient Instructions (Addendum)
[place] [facility] [facility] in [place], [place] in: [place] Address: [address], [place], [place] [address] Phone: [phone]  Quit smoking: 1-800-QUIT-NOW call for assistance.  Continue doing brain stimulating activities (puzzles, reading, adult coloring books, staying active) to keep memory sharp.   Continue to eat heart healthy diet (full of fruits, vegetables, whole grains, lean protein, water--limit salt, fat, and sugar intake) and increase physical activity as tolerated.   Colin Love , Thank you for taking time to come for your Medicare Wellness Visit. I appreciate your ongoing commitment to your health goals. Please review the following plan we discussed and let me know if I can assist you in the future.   These are the goals we discussed: Goals    . Patient Stated     Increase my physical activity by starting to walk twice a week.        This is a list of the screening recommended for you and due dates:  Health Maintenance  Topic Date Due  . Hemoglobin A1C  05/26/2017  . Eye exam for diabetics  07/10/2017  . Complete foot exam   05/28/2018  . Tetanus Vaccine  07/01/2020  . Colon Cancer Screening  07/24/2020  . Flu Shot  Completed  .  Hepatitis C: One time screening is recommended by Center for Disease Control  (CDC) for  adults born from [birth year] through 1966.   Completed  . Pneumonia vaccines  Completed      Carbohydrate Counting for Diabetes Mellitus, Adult Carbohydrate counting is a method for keeping track of how many carbohydrates you eat. Eating carbohydrates naturally increases the amount of sugar (glucose) in the blood. Counting how many carbohydrates you eat helps keep your blood glucose within normal limits, which helps you manage your diabetes (diabetes mellitus). It is important to know how many carbohydrates you can safely have in each meal. This is different for every person. A diet and nutrition specialist  (registered dietitian) can help you make a meal plan and calculate how many carbohydrates you should have at each meal and snack. Carbohydrates are found in the following foods:  Grains, such as breads and cereals.  Dried beans and soy products.  Starchy vegetables, such as potatoes, peas, and corn.  Fruit and fruit juices.  Milk and yogurt.  Sweets and snack foods, such as cake, cookies, candy, chips, and soft drinks.  How do I count carbohydrates? There are two ways to count carbohydrates in food. You can use either of the methods or a combination of both. Reading "Nutrition Facts" on packaged food The "Nutrition Facts" list is included on the labels of almost all packaged foods and beverages in the [place] It includes:  The serving size.  Information about nutrients in each serving, including the grams (g) of carbohydrate per serving.  To use the "Nutrition Facts":  Decide how many servings you will have.  Multiply the number of servings by the number of carbohydrates per serving.  The resulting number is the total amount of carbohydrates that you will be having.  Learning standard serving sizes of other foods When you eat foods containing carbohydrates that are not packaged or do not include "Nutrition Facts" on the label, you need to measure the servings in order to count the amount of carbohydrates:  Measure the foods that you will eat with a food scale or measuring cup, if needed.  Decide how many standard-size servings you will eat.  Multiply the number  of servings by 15. Most carbohydrate-rich foods have about 15 g of carbohydrates per serving. ? For example, if you eat 8 oz (170 g) of strawberries, you will have eaten 2 servings and 30 g of carbohydrates (2 servings x 15 g = 30 g).  For foods that have more than one food mixed, such as soups and casseroles, you must count the carbohydrates in each food that is included.  The following list contains standard serving  sizes of common carbohydrate-rich foods. Each of these servings has about 15 g of carbohydrates:   hamburger bun or  English muffin.   oz (15 mL) syrup.   oz (14 g) jelly.  1 slice of bread.  1 six-inch tortilla.  3 oz (85 g) cooked rice or pasta.  4 oz (113 g) cooked dried beans.  4 oz (113 g) starchy vegetable, such as peas, corn, or potatoes.  4 oz (113 g) hot cereal.  4 oz (113 g) mashed potatoes or  of a large baked potato.  4 oz (113 g) canned or frozen fruit.  4 oz (120 mL) fruit juice.  4-6 crackers.  6 chicken nuggets.  6 oz (170 g) unsweetened dry cereal.  6 oz (170 g) plain fat-free yogurt or yogurt sweetened with artificial sweeteners.  8 oz (240 mL) milk.  8 oz (170 g) fresh fruit or one small piece of fruit.  24 oz (680 g) popped popcorn.  Example of carbohydrate counting Sample meal  3 oz (85 g) chicken breast.  6 oz (170 g) brown rice.  4 oz (113 g) corn.  8 oz (240 mL) milk.  8 oz (170 g) strawberries with sugar-free whipped topping. Carbohydrate calculation 1. Identify the foods that contain carbohydrates: ? Rice. ? Corn. ? Milk. ? Strawberries. 2. Calculate how many servings you have of each food: ? 2 servings rice. ? 1 serving corn. ? 1 serving milk. ? 1 serving strawberries. 3. Multiply each number of servings by 15 g: ? 2 servings rice x 15 g = 30 g. ? 1 serving corn x 15 g = 15 g. ? 1 serving milk x 15 g = 15 g. ? 1 serving strawberries x 15 g = 15 g. 4. Add together all of the amounts to find the total grams of carbohydrates eaten: ? 30 g + 15 g + 15 g + 15 g = 75 g of carbohydrates total. This information is not intended to replace advice given to you by your health care provider. Make sure you discuss any questions you have with your health care provider. Document Released: 02/23/2005 Document Revised: 09/13/2015 Document Reviewed: 08/07/2015 Elsevier Interactive Patient Education  2018 [employer].   Diabetes  [place]ellitus and Nutrition When you have diabetes (diabetes mellitus), it is very important to have healthy eating habits because your blood sugar (glucose) levels are greatly affected by what you eat and drink. Eating healthy foods in the appropriate amounts, at about the same times every day, can help you:  Control your blood glucose.  Lower your risk of heart disease.  Improve your blood pressure.  Reach or maintain a healthy weight.  Every person with diabetes is different, and each person has different needs for a meal plan. Your health care provider may recommend that you work with a diet and nutrition specialist (dietitian) to make a meal plan that is best for you. Your meal plan may vary depending on factors such as:  The calories you need.  The medicines you  take.  Your weight.  Your blood glucose, blood pressure, and cholesterol levels.  Your activity level.  Other health conditions you have, such as heart or kidney disease.  How do carbohydrates affect me? Carbohydrates affect your blood glucose level more than any other type of food. Eating carbohydrates naturally increases the amount of glucose in your blood. Carbohydrate counting is a method for keeping track of how many carbohydrates you eat. Counting carbohydrates is important to keep your blood glucose at a healthy level, especially if you use insulin or take certain oral diabetes medicines. It is important to know how many carbohydrates you can safely have in each meal. This is different for every person. Your dietitian can help you calculate how many carbohydrates you should have at each meal and for snack. Foods that contain carbohydrates include:  Bread, cereal, rice, pasta, and crackers.  Potatoes and corn.  Peas, beans, and lentils.  Milk and yogurt.  Fruit and juice.  Desserts, such as cakes, cookies, ice cream, and candy.  How does alcohol affect me? Alcohol can cause a sudden decrease in blood glucose  (hypoglycemia), especially if you use insulin or take certain oral diabetes medicines. Hypoglycemia can be a life-threatening condition. Symptoms of hypoglycemia (sleepiness, dizziness, and confusion) are similar to symptoms of having too much alcohol. If your health care provider says that alcohol is safe for you, follow these guidelines:  Limit alcohol intake to no more than 1 drink per day for nonpregnant women and 2 drinks per day for men. One drink equals 12 oz of beer, 5 oz of wine, or 1 oz of hard liquor.  Do not drink on an empty stomach.  Keep yourself hydrated with water, diet soda, or unsweetened iced tea.  Keep in mind that regular soda, juice, and other mixers may contain a lot of sugar and must be counted as carbohydrates.  What are tips for following this plan? Reading food labels  Start by checking the serving size on the label. The amount of calories, carbohydrates, fats, and other nutrients listed on the label are based on one serving of the food. Many foods contain more than one serving per package.  Check the total grams (g) of carbohydrates in one serving. You can calculate the number of servings of carbohydrates in one serving by dividing the total carbohydrates by 15. For example, if a food has 30 g of total carbohydrates, it would be equal to 2 servings of carbohydrates.  Check the number of grams (g) of saturated and trans fats in one serving. Choose foods that have low or no amount of these fats.  Check the number of milligrams (mg) of sodium in one serving. Most people should limit total sodium intake to less than 2,300 mg per day.  Always check the nutrition information of foods labeled as "low-fat" or "nonfat". These foods may be higher in added sugar or refined carbohydrates and should be avoided.  Talk to your dietitian to identify your daily goals for nutrients listed on the label. Shopping  Avoid buying canned, premade, or processed foods. These foods tend  to be high in fat, sodium, and added sugar.  Shop around the outside edge of the grocery store. This includes fresh fruits and vegetables, bulk grains, fresh meats, and fresh dairy. Cooking  Use low-heat cooking methods, such as baking, instead of high-heat cooking methods like deep frying.  Cook using healthy oils, such as olive, canola, or sunflower oil.  Avoid cooking with butter, cream, or  high-fat meats. Meal planning  Eat meals and snacks regularly, preferably at the same times every day. Avoid going long periods of time without eating.  Eat foods high in fiber, such as fresh fruits, vegetables, beans, and whole grains. Talk to your dietitian about how many servings of carbohydrates you can eat at each meal.  Eat 4-6 ounces of lean protein each day, such as lean meat, chicken, fish, eggs, or tofu. 1 ounce is equal to 1 ounce of meat, chicken, or fish, 1 egg, or 1/4 cup of tofu.  Eat some foods each day that contain healthy fats, such as avocado, nuts, seeds, and fish. Lifestyle   Check your blood glucose regularly.  Exercise at least 30 minutes 5 or more days each week, or as told by your health care provider.  Take medicines as told by your health care provider.  Do not use any products that contain nicotine or tobacco, such as cigarettes and e-cigarettes. If you need help quitting, ask your health care provider.  Work with a Social worker or diabetes educator to identify strategies to manage stress and any emotional and social challenges. What are some questions to ask my health care provider?  Do I need to meet with a diabetes educator?  Do I need to meet with a dietitian?  What number can I call if I have questions?  When are the best times to check my blood glucose? Where to find more information:  American Diabetes Association: diabetes.org/food-and-fitness/food  Academy of Nutrition and Dietetics:  PokerClues.dk  [employer] of Diabetes and Digestive and Kidney Diseases (NIH): ContactWire.be Summary  A healthy meal plan will help you control your blood glucose and maintain a healthy lifestyle.  Working with a diet and nutrition specialist (dietitian) can help you make a meal plan that is best for you.  Keep in mind that carbohydrates and alcohol have immediate effects on your blood glucose levels. It is important to count carbohydrates and to use alcohol carefully. This information is not intended to replace advice given to you by your health care provider. Make sure you discuss any questions you have with your health care provider. Document Released: 11/20/2004 Document Revised: 03/30/2016 Document Reviewed: 03/30/2016 Elsevier Interactive Patient Education  2018 [employer].  Carbohydrate Counting for Diabetes Mellitus, Adult Carbohydrate counting is a method for keeping track of how many carbohydrates you eat. Eating carbohydrates naturally increases the amount of sugar (glucose) in the blood. Counting how many carbohydrates you eat helps keep your blood glucose within normal limits, which helps you manage your diabetes (diabetes mellitus). It is important to know how many carbohydrates you can safely have in each meal. This is different for every person. A diet and nutrition specialist (registered dietitian) can help you make a meal plan and calculate how many carbohydrates you should have at each meal and snack. Carbohydrates are found in the following foods:  Grains, such as breads and cereals.  Dried beans and soy products.  Starchy vegetables, such as potatoes, peas, and corn.  Fruit and fruit juices.  Milk and yogurt.  Sweets and snack foods, such as cake, cookies, candy, chips, and soft drinks.  How do I count carbohydrates? There are two  ways to count carbohydrates in food. You can use either of the methods or a combination of both. Reading "Nutrition Facts" on packaged food The "Nutrition Facts" list is included on the labels of almost all packaged foods and beverages in the [place] It includes:  The serving size.  Information about nutrients in each serving, including the grams (g) of carbohydrate per serving.  To use the "Nutrition Facts":  Decide how many servings you will have.  Multiply the number of servings by the number of carbohydrates per serving.  The resulting number is the total amount of carbohydrates that you will be having.  Learning standard serving sizes of other foods When you eat foods containing carbohydrates that are not packaged or do not include "Nutrition Facts" on the label, you need to measure the servings in order to count the amount of carbohydrates:  Measure the foods that you will eat with a food scale or measuring cup, if needed.  Decide how many standard-size servings you will eat.  Multiply the number of servings by 15. Most carbohydrate-rich foods have about 15 g of carbohydrates per serving. ? For example, if you eat 8 oz (170 g) of strawberries, you will have eaten 2 servings and 30 g of carbohydrates (2 servings x 15 g = 30 g).  For foods that have more than one food mixed, such as soups and casseroles, you must count the carbohydrates in each food that is included.  The following list contains standard serving sizes of common carbohydrate-rich foods. Each of these servings has about 15 g of carbohydrates:   hamburger bun or  English muffin.   oz (15 mL) syrup.   oz (14 g) jelly.  1 slice of bread.  1 six-inch tortilla.  3 oz (85 g) cooked rice or pasta.  4 oz (113 g) cooked dried beans.  4 oz (113 g) starchy vegetable, such as peas, corn, or potatoes.  4 oz (113 g) hot cereal.  4 oz (113 g) mashed potatoes or  of a large baked potato.  4 oz (113 g) canned or  frozen fruit.  4 oz (120 mL) fruit juice.  4-6 crackers.  6 chicken nuggets.  6 oz (170 g) unsweetened dry cereal.  6 oz (170 g) plain fat-free yogurt or yogurt sweetened with artificial sweeteners.  8 oz (240 mL) milk.  8 oz (170 g) fresh fruit or one small piece of fruit.  24 oz (680 g) popped popcorn.  Example of carbohydrate counting Sample meal  3 oz (85 g) chicken breast.  6 oz (170 g) brown rice.  4 oz (113 g) corn.  8 oz (240 mL) milk.  8 oz (170 g) strawberries with sugar-free whipped topping. Carbohydrate calculation 5. Identify the foods that contain carbohydrates: ? Rice. ? Corn. ? Milk. ? Strawberries. 6. Calculate how many servings you have of each food: ? 2 servings rice. ? 1 serving corn. ? 1 serving milk. ? 1 serving strawberries. 7. Multiply each number of servings by 15 g: ? 2 servings rice x 15 g = 30 g. ? 1 serving corn x 15 g = 15 g. ? 1 serving milk x 15 g = 15 g. ? 1 serving strawberries x 15 g = 15 g. 8. Add together all of the amounts to find the total grams of carbohydrates eaten: ? 30 g + 15 g + 15 g + 15 g = 75 g of carbohydrates total. This information is not intended to replace advice given to you by your health care provider. Make sure you discuss any questions you have with your health care provider. Document Released: 02/23/2005 Document Revised: 09/13/2015 Document Reviewed: 08/07/2015 Elsevier Interactive Patient Education  [employer].

## 2017-05-31 ENCOUNTER — Other Ambulatory Visit: Payer: Self-pay | Admitting: Internal Medicine

## 2017-07-04 ENCOUNTER — Other Ambulatory Visit: Payer: Self-pay | Admitting: Internal Medicine

## 2017-07-12 DIAGNOSIS — H401231 Low-tension glaucoma, bilateral, mild stage: Secondary | ICD-10-CM | POA: Diagnosis not present

## 2017-07-12 DIAGNOSIS — E119 Type 2 diabetes mellitus without complications: Secondary | ICD-10-CM | POA: Diagnosis not present

## 2017-07-12 DIAGNOSIS — H2513 Age-related nuclear cataract, bilateral: Secondary | ICD-10-CM | POA: Diagnosis not present

## 2017-07-12 DIAGNOSIS — H25011 Cortical age-related cataract, right eye: Secondary | ICD-10-CM | POA: Diagnosis not present

## 2017-07-12 LAB — HM DIABETES EYE EXAM

## 2017-07-13 ENCOUNTER — Encounter: Payer: Self-pay | Admitting: Internal Medicine

## 2017-07-20 ENCOUNTER — Ambulatory Visit (INDEPENDENT_AMBULATORY_CARE_PROVIDER_SITE_OTHER): Payer: Medicare HMO | Admitting: Family

## 2017-07-20 ENCOUNTER — Encounter: Payer: Self-pay | Admitting: Family

## 2017-07-20 ENCOUNTER — Other Ambulatory Visit: Payer: Self-pay | Admitting: Family

## 2017-07-20 VITALS — BP 124/80 | HR 75 | Temp 98.1°F | Ht 67.0 in | Wt 194.1 lb

## 2017-07-20 DIAGNOSIS — L089 Local infection of the skin and subcutaneous tissue, unspecified: Secondary | ICD-10-CM | POA: Diagnosis not present

## 2017-07-20 DIAGNOSIS — L729 Follicular cyst of the skin and subcutaneous tissue, unspecified: Secondary | ICD-10-CM | POA: Diagnosis not present

## 2017-07-20 MED ORDER — SULFAMETHOXAZOLE-TRIMETHOPRIM 800-160 MG PO TABS: 1.0000 | ORAL_TABLET | Freq: Two times a day (BID) | ORAL | 0 refills | Status: DC

## 2017-07-20 NOTE — Progress Notes (Signed)
Colin Love is a 72 y.o. male with the following history as recorded in [employer]:  Patient Active Problem List   Diagnosis Date Noted  . Abnormal echocardiogram 05/26/2016  . Burning sensation of feet 05/26/2016  . Dizziness and giddiness 08/07/2015  . Sudden right hearing loss 08/07/2015  . Irregular heart rhythm 08/07/2015  . Glaucoma 05/21/2015  . PAC (premature atrial contraction) 01/24/2014  . Bladder cancer ([place]) 01/09/2014  . Smoker   . Diabetes type 2, controlled ([place]) 12/23/2009  . Dyslipidemia 12/23/2009  . Essential hypertension 12/23/2009  . COLONIC POLYPS, HX OF 12/23/2009  . DIVERTICULITIS, HX OF 12/23/2009    Current Outpatient Medications  Medication Sig Dispense Refill  . ACCU-CHEK AVIVA PLUS test strip TEST UP TO 4 TIMES A DAY 300 each 1  . ACCU-CHEK SOFTCLIX LANCETS lancets TEST UP TO 4 TIMES A DAY 300 each 1  . aspirin 81 MG tablet Take 81 mg by mouth daily.      Marland Kitchen atorvastatin (LIPITOR) 20 MG tablet take 1 tablet by mouth once daily AT 6 PM 90 tablet 3  . blood glucose meter kit and supplies KIT Dispense based on patient and insurance preference. Use up to four times daily as directed. (FOR ICD-9 250.00, 250.01). 1 each 0  . diltiazem (CARDIZEM CD) 180 MG 24 hr capsule take 1 capsule by mouth twice a day 180 capsule 3  . latanoprost (XALATAN) 0.005 % ophthalmic solution Place 1 drop into both eyes at bedtime.     Marland Kitchen lisinopril-hydrochlorothiazide (PRINZIDE,ZESTORETIC) 20-12.5 MG tablet take 1 tablet by mouth twice a day 180 tablet 3  . metFORMIN (GLUCOPHAGE) 500 MG tablet TAKE 1 TABLET TWICE A DAY WITH FOOD 180 tablet 1  . potassium chloride SA (K-DUR,KLOR-CON) 20 MEQ tablet Take 1 tablet (20 mEq total) by mouth daily. 90 tablet 1  . terazosin (HYTRIN) 10 MG capsule TAKE ONE CAPSULE AT BEDTIME 90 capsule 1  . sulfamethoxazole-trimethoprim (BACTRIM DS,SEPTRA DS) 800-160 MG tablet Take 1 tablet by mouth 2 (two) times daily. 20 tablet 0   No current  facility-administered medications for this visit.     Allergies: Patient has no known allergies.  Past Medical History:  Diagnosis Date  . Adenoma of left adrenal gland   . At risk for sleep apnea    STOP-BANG= 4    SENT TO PCP 02-06-2014  . Bladder cancer ([place]) 02/2014   local, low grade pap uro carcinoma  . BPH (benign prostatic hypertrophy)   . Diverticulosis of colon   . Full dentures   . GERD (gastroesophageal reflux disease)   . History of adenomatous polyp of colon   . History of diverticulitis of colon   . Hyperlipidemia   . Hypertension   . Murmur, cardiac   . PAC (premature atrial contraction)   . Type 2 diabetes mellitus ([place])     Past Surgical History:  Procedure Laterality Date  . ANTERIOR CERVICAL DECOMP/DISCECTOMY FUSION  05-24-2003   C4  --  C7  . COLONOSCOPY W/ POLYPECTOMY  last one 07-24-2013 w/ EGD  . CYSTOSCOPY W/ RETROGRADES Bilateral 02/09/2014   Procedure: BILATERAL RETROGRADE PYELOGRAM , Cystoscopy;  Surgeon: Ardis Hughs, MD;  Location[facility];  Service: Urology;  Laterality: Bilateral;  . TRANSURETHRAL RESECTION OF BLADDER TUMOR WITH GYRUS (TURBT-GYRUS) N/A 02/09/2014   Procedure: Bladder Biopsies;  Surgeon: Ardis Hughs, MD;  Location[facility];  Service: Urology;  Laterality: N/A;    Family History  Problem Relation Age of Onset  . Colon cancer Mother   . Hypertension Mother   . Hyperlipidemia Mother   . Hypertension Father   . Hyperlipidemia Father   . Diabetes Brother     Social History   Tobacco Use  . Smoking status: Current Every Day Smoker    Packs/day: 0.50    Years: 40.00    Pack years: 20.00    Types: Cigarettes  . Smokeless tobacco: Never Used  Substance Use Topics  . Alcohol use: Yes    Comment: OCCASIONAL    Subjective:  Patient presents with concerns about worsening pain/ swelling in a "lump" on the left side of his face; notes area has been present "for a while" and seems  to have gotten bigger in the past few months; notes that over the weekend, area began draining bloody pus; denies any fever or difficulty swallowing or sore throat;   Objective:  Vitals:   07/20/17 0841  BP: 124/80  Pulse: 75  Temp: 98.1 F (36.7 C)  TempSrc: Oral  SpO2: 98%  Weight: 194 lb 1.3 oz (88 kg)  Height: _0  (1.702 m)    General: Well developed, well nourished, in no acute distress  Skin : Warm and dry. 4 cm x 4 cm infected cyst noted at left jaw; small amount of blood tinged discharge noted; Head: Normocephalic and atraumatic  Lungs: Respirations unlabored; clear to auscultation bilaterally without wheeze, rales, rhonchi  Neurologic: Alert and oriented; speech intact; face symmetrical; moves all extremities well; CNII-XII intact without focal deficit  Assessment:  1. Infected cyst of skin     Plan:  Will treat with Bactrim DS bid x 10 days; apply warm compresses to affected area; refer to surgeon for further evaluation- scheduled to see specialist ( at dermatology) on Thursday, Jul 22, 2017. Follow-up with his PCP as scheduled.   No follow-ups on file.  Orders Placed This Encounter  Procedures  . Ambulatory referral to General Surgery    Referral Priority:   Urgent    Referral Type:   Surgical    Referral Reason:   Specialty Services Required    Requested Specialty:   General Surgery    Number of Visits Requested:   1    Requested Prescriptions   Signed Prescriptions Disp Refills  . sulfamethoxazole-trimethoprim (BACTRIM DS,SEPTRA DS) 800-160 MG tablet 20 tablet 0    Sig: Take 1 tablet by mouth 2 (two) times daily.

## 2017-07-22 DIAGNOSIS — L720 Epidermal cyst: Secondary | ICD-10-CM | POA: Diagnosis not present

## 2017-07-26 DIAGNOSIS — C672 Malignant neoplasm of lateral wall of bladder: Secondary | ICD-10-CM | POA: Diagnosis not present

## 2017-11-28 NOTE — Progress Notes (Signed)
Subjective:    Patient ID: Colin Love, male    DOB: 05-Nov-1945, 72 y.o.   MRN: [id number]  HPI The patient is here for follow up.  Hypertension: He is taking his medication daily. He is compliant with a low sodium diet.  He denies chest pain, edema, shortness of breath and regular headaches. He is exercising minimally.     Diabetes: He is taking his medication daily as prescribed. He is compliant with a diabetic diet. He is exercising minimally. He monitors his sugars and they have been running 120's on average. He checks his feet daily and denies foot lesions. He is up-to-date with an ophthalmology examination.   Hyperlipidemia: He is taking his medication daily. He is compliant with a low fat/cholesterol diet. He is exercising minimally. He denies myalgias.   Right fifth toe stinging sensation:  It is intermittent and is transient.  He feels it more when he washes his feet.  No numbness/tingling.  He denies pain with walking.    Medications and allergies reviewed with patient and updated if appropriate.  Patient Active Problem List   Diagnosis Date Noted  . Abnormal echocardiogram 05/26/2016  . Burning sensation of feet 05/26/2016  . Dizziness and giddiness 08/07/2015  . Sudden right hearing loss 08/07/2015  . Irregular heart rhythm 08/07/2015  . Glaucoma 05/21/2015  . PAC (premature atrial contraction) 01/24/2014  . Bladder cancer ([place]) 01/09/2014  . Smoker   . Diabetes type 2, controlled ([place]) 12/23/2009  . Dyslipidemia 12/23/2009  . Essential hypertension 12/23/2009  . COLONIC POLYPS, HX OF 12/23/2009  . DIVERTICULITIS, HX OF 12/23/2009    Current Outpatient Medications on File Prior to Visit  Medication Sig Dispense Refill  . aspirin 81 MG tablet Take 81 mg by mouth daily.      Marland Kitchen atorvastatin (LIPITOR) 20 MG tablet take 1 tablet by mouth once daily AT 6 PM 90 tablet 3  . blood glucose meter kit and supplies KIT Dispense based on patient and insurance preference.  Use up to four times daily as directed. (FOR ICD-9 250.00, 250.01). 1 each 0  . diltiazem (CARDIZEM CD) 180 MG 24 hr capsule take 1 capsule by mouth twice a day 180 capsule 3  . latanoprost (XALATAN) 0.005 % ophthalmic solution Place 1 drop into both eyes at bedtime.     Marland Kitchen lisinopril-hydrochlorothiazide (PRINZIDE,ZESTORETIC) 20-12.5 MG tablet take 1 tablet by mouth twice a day 180 tablet 3  . metFORMIN (GLUCOPHAGE) 500 MG tablet TAKE 1 TABLET TWICE A DAY WITH FOOD 180 tablet 1  . potassium chloride SA (K-DUR,KLOR-CON) 20 MEQ tablet Take 1 tablet (20 mEq total) by mouth daily. 90 tablet 1  . terazosin (HYTRIN) 10 MG capsule TAKE ONE CAPSULE AT BEDTIME 90 capsule 1   No current facility-administered medications on file prior to visit.     Past Medical History:  Diagnosis Date  . Adenoma of left adrenal gland   . At risk for sleep apnea    STOP-BANG= 4    SENT TO PCP 02-06-2014  . Bladder cancer ([place]) 02/2014   local, low grade pap uro carcinoma  . BPH (benign prostatic hypertrophy)   . Diverticulosis of colon   . Full dentures   . GERD (gastroesophageal reflux disease)   . History of adenomatous polyp of colon   . History of diverticulitis of colon   . Hyperlipidemia   . Hypertension   . Murmur, cardiac   . PAC (premature atrial contraction)   .  Type 2 diabetes mellitus ([place])     Past Surgical History:  Procedure Laterality Date  . ANTERIOR CERVICAL DECOMP/DISCECTOMY FUSION  05-24-2003   C4  --  C7  . COLONOSCOPY W/ POLYPECTOMY  last one 07-24-2013 w/ EGD  . CYSTOSCOPY W/ RETROGRADES Bilateral 02/09/2014   Procedure: BILATERAL RETROGRADE PYELOGRAM , Cystoscopy;  Surgeon: Ardis Hughs, MD;  Location: [facility];  Service: Urology;  Laterality: Bilateral;  . TRANSURETHRAL RESECTION OF BLADDER TUMOR WITH GYRUS (TURBT-GYRUS) N/A 02/09/2014   Procedure: Bladder Biopsies;  Surgeon: Ardis Hughs, MD;  Location: [facility];  Service: Urology;   Laterality: N/A;    Social History   Socioeconomic History  . Marital status: Married    Spouse name: Not on file  . Number of children: Not on file  . Years of education: Not on file  . Highest education level: Not on file  Occupational History  . Not on file  Social Needs  . Financial resource strain: Not hard at all  . Food insecurity:    Worry: Never true    Inability: Never true  . Transportation needs:    Medical: No    Non-medical: No  Tobacco Use  . Smoking status: Current Every Day Smoker    Packs/day: 0.50    Years: 40.00    Pack years: 20.00    Types: Cigarettes  . Smokeless tobacco: Never Used  Substance and Sexual Activity  . Alcohol use: Yes    Comment: OCCASIONAL  . Drug use: No  . Sexual activity: Not Currently  Lifestyle  . Physical activity:    Days per week: 0 days    Minutes per session: 0 min  . Stress: Not at all  Relationships  . Social connections:    Talks on phone: More than three times a week    Gets together: More than three times a week    Attends religious service: More than 4 times per year    Active member of club or organization: Yes    Attends meetings of clubs or organizations: More than 4 times per year    Relationship status: Married  Other Topics Concern  . Not on file  Social History Narrative  . Not on file    Family History  Problem Relation Age of Onset  . Colon cancer Mother   . Hypertension Mother   . Hyperlipidemia Mother   . Hypertension Father   . Hyperlipidemia Father   . Diabetes Brother     Review of Systems  Constitutional: Negative for chills and fever.  Respiratory: Negative for cough, shortness of breath and wheezing.   Cardiovascular: Positive for palpitations (occasional). Negative for chest pain and leg swelling.  Musculoskeletal: Negative for myalgias.  Neurological: Negative for light-headedness and headaches.       Objective:   Vitals:   11/29/17 0930  BP: 128/66  Pulse: 85  Resp:  16  Temp: 98.3 F (36.8 C)  SpO2: 96%   BP Readings from Last 3 Encounters:  11/29/17 128/66  07/20/17 124/80  05/27/17 137/72   Wt Readings from Last 3 Encounters:  11/29/17 192 lb (87.1 kg)  07/20/17 194 lb 1.3 oz (88 kg)  05/27/17 195 lb (88.5 kg)   Body mass index is 30.07 kg/m.   Physical Exam    Constitutional: Appears well-developed and well-nourished. No distress.  HENT:  Head: Normocephalic and atraumatic.  Neck: Neck supple. No tracheal deviation present. No thyromegaly present.  No  cervical lymphadenopathy Cardiovascular: Normal rate, regular rhythm and normal heart sounds.   No murmur heard. No carotid bruit .  No edema Pulmonary/Chest: Effort normal and breath sounds normal. No respiratory distress. No has no wheezes. No rales.  Skin: Skin is warm and dry. Not diaphoretic.  Right 5th toe normal appearing - no skin changes, normal sensation Psychiatric: Normal mood and affect. Behavior is normal.      Assessment & Plan:    See Problem List for Assessment and Plan of chronic medical problems.

## 2017-11-28 NOTE — Patient Instructions (Addendum)
  Tests ordered today. Your results will be released to MyChart (or called to you) after review, usually within 72hours after test completion. If any changes need to be made, you will be notified at that same time.  Flu immunization administered today.   Medications reviewed and updated.  Changes include :   none  Your prescription(s) have been submitted to your pharmacy. Please take as directed and contact our office if you believe you are having problem(s) with the medication(s).   Please followup in 6 months   

## 2017-11-29 ENCOUNTER — Other Ambulatory Visit (INDEPENDENT_AMBULATORY_CARE_PROVIDER_SITE_OTHER): Payer: Medicare HMO

## 2017-11-29 ENCOUNTER — Encounter: Payer: Self-pay | Admitting: Internal Medicine

## 2017-11-29 ENCOUNTER — Other Ambulatory Visit: Payer: Self-pay | Admitting: Internal Medicine

## 2017-11-29 ENCOUNTER — Ambulatory Visit (INDEPENDENT_AMBULATORY_CARE_PROVIDER_SITE_OTHER): Payer: Medicare HMO | Admitting: Internal Medicine

## 2017-11-29 VITALS — BP 128/66 | HR 85 | Temp 98.3°F | Resp 16 | Ht 67.0 in | Wt 192.0 lb

## 2017-11-29 DIAGNOSIS — E119 Type 2 diabetes mellitus without complications: Secondary | ICD-10-CM

## 2017-11-29 DIAGNOSIS — E785 Hyperlipidemia, unspecified: Secondary | ICD-10-CM

## 2017-11-29 DIAGNOSIS — I10 Essential (primary) hypertension: Secondary | ICD-10-CM

## 2017-11-29 DIAGNOSIS — Z23 Encounter for immunization: Secondary | ICD-10-CM | POA: Diagnosis not present

## 2017-11-29 LAB — COMPREHENSIVE METABOLIC PANEL WITH GFR
ALT: 12 U/L (ref 0–53)
AST: 14 U/L (ref 0–37)
Albumin: 4.3 g/dL (ref 3.5–5.2)
Alkaline Phosphatase: 59 U/L (ref 39–117)
BUN: 12 mg/dL (ref 6–23)
CO2: 28 meq/L (ref 19–32)
Calcium: 9.8 mg/dL (ref 8.4–10.5)
Chloride: 101 meq/L (ref 96–112)
Creatinine, Ser: 0.81 mg/dL (ref 0.40–1.50)
GFR: 120.14 mL/min
Glucose, Bld: 119 mg/dL — ABNORMAL HIGH (ref 70–99)
Potassium: 3.6 meq/L (ref 3.5–5.1)
Sodium: 137 meq/L (ref 135–145)
Total Bilirubin: 0.7 mg/dL (ref 0.2–1.2)
Total Protein: 6.9 g/dL (ref 6.0–8.3)

## 2017-11-29 LAB — LIPID PANEL
Cholesterol: 120 mg/dL (ref 0–200)
HDL: 46 mg/dL
LDL Cholesterol: 54 mg/dL (ref 0–99)
NonHDL: 74.19
Total CHOL/HDL Ratio: 3
Triglycerides: 99 mg/dL (ref 0.0–149.0)
VLDL: 19.8 mg/dL (ref 0.0–40.0)

## 2017-11-29 LAB — HEMOGLOBIN A1C: Hgb A1c MFr Bld: 6.5 % (ref 4.6–6.5)

## 2017-11-29 MED ORDER — ACCU-CHEK AVIVA PLUS VI STRP: ORAL_STRIP | 1 refills | Status: DC

## 2017-11-29 MED ORDER — ACCU-CHEK SOFTCLIX LANCETS MISC: 1 refills | Status: DC

## 2017-11-29 NOTE — Assessment & Plan Note (Signed)
a1c Continue metformin Exercising some - ideally increase exercise Follow up in 6 months

## 2017-11-29 NOTE — Assessment & Plan Note (Signed)
Check lipid panel  Continue daily statin Regular exercise and healthy diet encouraged  

## 2017-11-29 NOTE — Assessment & Plan Note (Signed)
BP well controlled Current regimen effective and well tolerated Continue current medications at current doses cmp  

## 2017-11-30 DIAGNOSIS — R69 Illness, unspecified: Secondary | ICD-10-CM | POA: Diagnosis not present

## 2017-11-30 MED ORDER — ACCU-CHEK AVIVA PLUS VI STRP: 1.0000 | ORAL_STRIP | Freq: Two times a day (BID) | 1 refills | Status: DC

## 2017-11-30 NOTE — Addendum Note (Signed)
Addended by: Earnstine Regal on: 11/30/2017 02:26 PM   Modules accepted: Orders

## 2017-12-01 DIAGNOSIS — R69 Illness, unspecified: Secondary | ICD-10-CM | POA: Diagnosis not present

## 2017-12-13 ENCOUNTER — Other Ambulatory Visit: Payer: Self-pay | Admitting: Internal Medicine

## 2017-12-14 ENCOUNTER — Other Ambulatory Visit: Payer: Self-pay | Admitting: Internal Medicine

## 2017-12-19 ENCOUNTER — Other Ambulatory Visit: Payer: Self-pay | Admitting: Internal Medicine

## 2018-01-07 ENCOUNTER — Other Ambulatory Visit: Payer: Self-pay | Admitting: Internal Medicine

## 2018-01-13 DIAGNOSIS — H401231 Low-tension glaucoma, bilateral, mild stage: Secondary | ICD-10-CM | POA: Diagnosis not present

## 2018-01-21 ENCOUNTER — Telehealth: Payer: Self-pay | Admitting: Internal Medicine

## 2018-01-21 NOTE — Telephone Encounter (Signed)
Copied from [place] [id number]. Topic: Quick Communication - Rx Refill/Question >> Jan 21, 2018 11:23 AM Margot Ables wrote: Medication: KLOR-CON M20 20 MEQ tablet - pt looking at insurance plans and this medication is dispensed as brand name for him - he is asking if he would be able to change to generic for the next refill so it is less expensive since it will be going up to a tier 3 medication - please notify pt  Preferred Pharmacy (with phone number or street name): [id number] [address], [place]. [phone] (Phone) [phone] (Fax)

## 2018-01-26 ENCOUNTER — Other Ambulatory Visit: Payer: Self-pay | Admitting: Internal Medicine

## 2018-01-28 NOTE — Telephone Encounter (Signed)
Will send in generic next time rx is due.

## 2018-03-07 DIAGNOSIS — R69 Illness, unspecified: Secondary | ICD-10-CM | POA: Diagnosis not present

## 2018-03-31 ENCOUNTER — Other Ambulatory Visit: Payer: Self-pay | Admitting: Internal Medicine

## 2018-03-31 MED ORDER — POTASSIUM CHLORIDE ER 10 MEQ PO TBCR: 10.0000 meq | EXTENDED_RELEASE_TABLET | Freq: Every day | ORAL | 1 refills | Status: DC

## 2018-05-27 ENCOUNTER — Other Ambulatory Visit: Payer: Self-pay | Admitting: Internal Medicine

## 2018-05-27 DIAGNOSIS — R69 Illness, unspecified: Secondary | ICD-10-CM | POA: Diagnosis not present

## 2018-05-30 ENCOUNTER — Telehealth: Payer: Self-pay

## 2018-05-30 NOTE — Telephone Encounter (Signed)
Tried calling patient about 9:00 am OV with Dr. Quay Burow tomorrow. LVM for pt to call back. Want to see if he will do a virtual video office visit.

## 2018-05-31 ENCOUNTER — Ambulatory Visit: Payer: Medicare HMO | Admitting: Internal Medicine

## 2018-05-31 NOTE — Telephone Encounter (Signed)
Pt cancelled appt

## 2018-06-26 DIAGNOSIS — R69 Illness, unspecified: Secondary | ICD-10-CM | POA: Diagnosis not present

## 2018-06-28 ENCOUNTER — Other Ambulatory Visit: Payer: Self-pay | Admitting: Internal Medicine

## 2018-07-15 ENCOUNTER — Other Ambulatory Visit: Payer: Self-pay | Admitting: Internal Medicine

## 2018-07-18 DIAGNOSIS — H25011 Cortical age-related cataract, right eye: Secondary | ICD-10-CM | POA: Diagnosis not present

## 2018-07-18 DIAGNOSIS — H2513 Age-related nuclear cataract, bilateral: Secondary | ICD-10-CM | POA: Diagnosis not present

## 2018-07-18 DIAGNOSIS — E119 Type 2 diabetes mellitus without complications: Secondary | ICD-10-CM | POA: Diagnosis not present

## 2018-07-18 DIAGNOSIS — H401231 Low-tension glaucoma, bilateral, mild stage: Secondary | ICD-10-CM | POA: Diagnosis not present

## 2018-07-18 LAB — HM DIABETES EYE EXAM

## 2018-07-20 ENCOUNTER — Encounter: Payer: Self-pay | Admitting: Internal Medicine

## 2018-08-03 NOTE — Progress Notes (Signed)
Subjective:    Patient ID: Colin Love, male    DOB: 03/02/1946, 73 y.o.   MRN: [id number]  HPI The patient is here for follow up.  He is not exercising regularly.     Diabetes: He is taking his medication daily as prescribed. He is compliant with a diabetic diet.   He checks his feet daily and denies foot lesions.  He uses a diabetic lotion, but not daily.  He does have an area of a dry spot and was concerned about that.  He is up-to-date with an ophthalmology examination.  Burning in feet: He still has this, but it is intermittent.  He did not feel that he benefit from the gabapentin and does not want to retry it.  It is tolerable.  Hypertension: He is taking his medication daily. He is compliant with a low sodium diet.  He denies chest pain, palpitations, edema, shortness of breath and regular headaches.     Hyperlipidemia: He is taking his medication daily. He is compliant with a low fat/cholesterol diet. He denies myalgias.   Sciatica:  Right lower back pain and radiates to his knee:  He describes it as a pain and numbness.  It has been going on for a while - maybe one month.  He denies weakness.  He denies injuries.  He denies prior episodes.  The pain is intermittent and has not improved since it started.  Tobacco abuse:  He wants to stop.  He smokes only outside.  He is thinking about using the patches to help him quit.  He smokes almost one pack a day.   Medications and allergies reviewed with patient and updated if appropriate.  Patient Active Problem List   Diagnosis Date Noted  . Sciatica of right side 08/04/2018  . Abnormal echocardiogram 05/26/2016  . Burning sensation of feet 05/26/2016  . Dizziness and giddiness 08/07/2015  . Sudden right hearing loss 08/07/2015  . Irregular heart rhythm 08/07/2015  . Glaucoma 05/21/2015  . PAC (premature atrial contraction) 01/24/2014  . Bladder cancer ([place]) 01/09/2014  . Tobacco abuse   . Diabetes type 2, controlled ([place])  12/23/2009  . Dyslipidemia 12/23/2009  . Essential hypertension 12/23/2009  . COLONIC POLYPS, HX OF 12/23/2009  . DIVERTICULITIS, HX OF 12/23/2009    Current Outpatient Medications on File Prior to Visit  Medication Sig Dispense Refill  . ACCU-CHEK SOFTCLIX LANCETS lancets TEST UP TO 4 TIMES A DAY 300 each 1  . aspirin 81 MG tablet Take 81 mg by mouth daily.      Marland Kitchen atorvastatin (LIPITOR) 20 MG tablet TAKE 1 TABLET BY MOUTH ONCE DAILY AT 6PM 90 tablet 2  . blood glucose meter kit and supplies KIT Dispense based on patient and insurance preference. Use up to four times daily as directed. (FOR ICD-9 250.00, 250.01). 1 each 0  . diltiazem (CARDIZEM CD) 180 MG 24 hr capsule TAKE 1 CAPSULE BY MOUTH 2 TIMES A DAY 180 capsule 2  . latanoprost (XALATAN) 0.005 % ophthalmic solution Place 1 drop into both eyes at bedtime.     Marland Kitchen lisinopril-hydrochlorothiazide (PRINZIDE,ZESTORETIC) 20-12.5 MG tablet TAKE 1 TABLET BY MOUTH 2 TIMES A DAY 180 tablet 2  . metFORMIN (GLUCOPHAGE) 500 MG tablet TAKE 1 TABLET BY MOUTH TWICE A DAY WITH FOOD 180 tablet 0  . ONE TOUCH ULTRA TEST test strip TEST UP TO 4 TIMES DAILY 100 each 1  . potassium chloride (K-DUR) 10 MEQ tablet Take 1 tablet (10 mEq  total) by mouth daily. Take 1 tablets (20 mEq total) once daily. 180 tablet 1  . terazosin (HYTRIN) 10 MG capsule TAKE ONE CAPSULE AT BEDTIME 90 capsule 0   No current facility-administered medications on file prior to visit.     Past Medical History:  Diagnosis Date  . Adenoma of left adrenal gland   . At risk for sleep apnea    STOP-BANG= 4    SENT TO PCP 02-06-2014  . Bladder cancer ([place]) 02/2014   local, low grade pap uro carcinoma  . BPH (benign prostatic hypertrophy)   . Diverticulosis of colon   . Full dentures   . GERD (gastroesophageal reflux disease)   . History of adenomatous polyp of colon   . History of diverticulitis of colon   . Hyperlipidemia   . Hypertension   . Murmur, cardiac   . PAC (premature  atrial contraction)   . Type 2 diabetes mellitus ([place])     Past Surgical History:  Procedure Laterality Date  . ANTERIOR CERVICAL DECOMP/DISCECTOMY FUSION  05-24-2003   C4  --  C7  . COLONOSCOPY W/ POLYPECTOMY  last one 07-24-2013 w/ EGD  . CYSTOSCOPY W/ RETROGRADES Bilateral 02/09/2014   Procedure: BILATERAL RETROGRADE PYELOGRAM , Cystoscopy;  Surgeon: Ardis Hughs, MD;  Location: [facility];  Service: Urology;  Laterality: Bilateral;  . TRANSURETHRAL RESECTION OF BLADDER TUMOR WITH GYRUS (TURBT-GYRUS) N/A 02/09/2014   Procedure: Bladder Biopsies;  Surgeon: Ardis Hughs, MD;  Location[facility];  Service: Urology;  Laterality: N/A;    Social History   Socioeconomic History  . Marital status: Married    Spouse name: Not on file  . Number of children: Not on file  . Years of education: Not on file  . Highest education level: Not on file  Occupational History  . Not on file  Social Needs  . Financial resource strain: Not hard at all  . Food insecurity:    Worry: Never true    Inability: Never true  . Transportation needs:    Medical: No    Non-medical: No  Tobacco Use  . Smoking status: Current Every Day Smoker    Packs/day: 0.50    Years: 40.00    Pack years: 20.00    Types: Cigarettes  . Smokeless tobacco: Never Used  Substance and Sexual Activity  . Alcohol use: Yes    Comment: OCCASIONAL  . Drug use: No  . Sexual activity: Not Currently  Lifestyle  . Physical activity:    Days per week: 0 days    Minutes per session: 0 min  . Stress: Not at all  Relationships  . Social connections:    Talks on phone: More than three times a week    Gets together: More than three times a week    Attends religious service: More than 4 times per year    Active member of club or organization: Yes    Attends meetings of clubs or organizations: More than 4 times per year    Relationship status: Married  Other Topics Concern  . Not on  file  Social History Narrative  . Not on file    Family History  Problem Relation Age of Onset  . Colon cancer Mother   . Hypertension Mother   . Hyperlipidemia Mother   . Hypertension Father   . Hyperlipidemia Father   . Diabetes Brother     Review of Systems  Constitutional: Negative for chills and  fever.  Respiratory: Positive for cough (occasional). Negative for shortness of breath and wheezing.   Cardiovascular: Negative for chest pain, palpitations and leg swelling.  Musculoskeletal: Positive for back pain.       Objective:   Vitals:   08/04/18 0944  BP: (!) 146/78  Pulse: 80  Resp: 16  Temp: 98.1 F (36.7 C)  SpO2: 99%   BP Readings from Last 3 Encounters:  08/04/18 (!) 146/78  11/29/17 128/66  07/20/17 124/80   Wt Readings from Last 3 Encounters:  08/04/18 191 lb (86.6 kg)  11/29/17 192 lb (87.1 kg)  07/20/17 194 lb 1.3 oz (88 kg)   Body mass index is 29.91 kg/m.   Physical Exam    Constitutional: Appears well-developed and well-nourished. No distress.  HENT:  Head: Normocephalic and atraumatic.  Neck: Neck supple. No tracheal deviation present. No thyromegaly present.  No cervical lymphadenopathy Cardiovascular: Normal rate, regular rhythm and normal heart sounds.   No murmur heard. No carotid bruit .  No edema Pulmonary/Chest: Effort normal and breath sounds normal. No respiratory distress. No has no wheezes. No rales.  Skin: Skin is warm and dry. Not diaphoretic.  Psychiatric: Normal mood and affect. Behavior is normal.    Diabetic Foot Exam - Simple   Simple Foot Form Diabetic Foot exam was performed with the following findings:  Yes 08/04/2018 12:16 PM  Visual Inspection No deformities, no ulcerations, no other skin breakdown bilaterally:  Yes Sensation Testing Intact to touch and monofilament testing bilaterally:  Yes Pulse Check Posterior Tibialis and Dorsalis pulse intact bilaterally:  Yes Comments Minimal dry areas posterior right  fifth ball of foot and left heel, no breaks in skin        Assessment & Plan:    See Problem List for Assessment and Plan of chronic medical problems.

## 2018-08-03 NOTE — Patient Instructions (Addendum)
  Tests ordered today. Your results will be released to MyChart (or called to you) after review, usually within 72hours after test completion. If any changes need to be made, you will be notified at that same time.  Medications reviewed and updated.  Changes include :   none      Please followup in 6 months   

## 2018-08-04 ENCOUNTER — Other Ambulatory Visit (INDEPENDENT_AMBULATORY_CARE_PROVIDER_SITE_OTHER): Payer: Medicare HMO

## 2018-08-04 ENCOUNTER — Ambulatory Visit (INDEPENDENT_AMBULATORY_CARE_PROVIDER_SITE_OTHER)
Admission: RE | Admit: 2018-08-04 | Discharge: 2018-08-04 | Disposition: A | Discharge status: Home / Self Care | Payer: Medicare HMO | Source: Ambulatory Visit | Attending: Internal Medicine | Admitting: Internal Medicine

## 2018-08-04 ENCOUNTER — Encounter: Payer: Self-pay | Admitting: Internal Medicine

## 2018-08-04 ENCOUNTER — Ambulatory Visit (INDEPENDENT_AMBULATORY_CARE_PROVIDER_SITE_OTHER): Payer: Medicare HMO | Admitting: Internal Medicine

## 2018-08-04 ENCOUNTER — Other Ambulatory Visit: Payer: Self-pay

## 2018-08-04 VITALS — BP 146/78 | HR 80 | Temp 98.1°F | Resp 16 | Ht 67.0 in | Wt 191.0 lb

## 2018-08-04 DIAGNOSIS — Z720 Tobacco use: Secondary | ICD-10-CM | POA: Diagnosis not present

## 2018-08-04 DIAGNOSIS — M5431 Sciatica, right side: Secondary | ICD-10-CM | POA: Diagnosis not present

## 2018-08-04 DIAGNOSIS — E119 Type 2 diabetes mellitus without complications: Secondary | ICD-10-CM | POA: Diagnosis not present

## 2018-08-04 DIAGNOSIS — E785 Hyperlipidemia, unspecified: Secondary | ICD-10-CM

## 2018-08-04 DIAGNOSIS — F17210 Nicotine dependence, cigarettes, uncomplicated: Secondary | ICD-10-CM

## 2018-08-04 DIAGNOSIS — R208 Other disturbances of skin sensation: Secondary | ICD-10-CM

## 2018-08-04 DIAGNOSIS — I10 Essential (primary) hypertension: Secondary | ICD-10-CM

## 2018-08-04 DIAGNOSIS — R69 Illness, unspecified: Secondary | ICD-10-CM | POA: Diagnosis not present

## 2018-08-04 DIAGNOSIS — M545 Low back pain: Secondary | ICD-10-CM | POA: Diagnosis not present

## 2018-08-04 LAB — LIPID PANEL
Cholesterol: 124 mg/dL (ref 0–200)
HDL: 43.4 mg/dL
LDL Cholesterol: 66 mg/dL (ref 0–99)
NonHDL: 80.37
Total CHOL/HDL Ratio: 3
Triglycerides: 72 mg/dL (ref 0.0–149.0)
VLDL: 14.4 mg/dL (ref 0.0–40.0)

## 2018-08-04 LAB — CBC WITH DIFFERENTIAL/PLATELET
Basophils Absolute: 0.1 10*3/uL (ref 0.0–0.1)
Basophils Relative: 0.7 % (ref 0.0–3.0)
Eosinophils Absolute: 0.1 10*3/uL (ref 0.0–0.7)
Eosinophils Relative: 1.6 % (ref 0.0–5.0)
HCT: 42.2 % (ref 39.0–52.0)
Hemoglobin: 14.4 g/dL (ref 13.0–17.0)
Lymphocytes Relative: 16.4 % (ref 12.0–46.0)
Lymphs Abs: 1.1 10*3/uL (ref 0.7–4.0)
MCHC: 34 g/dL (ref 30.0–36.0)
MCV: 87.6 fl (ref 78.0–100.0)
Monocytes Absolute: 0.6 10*3/uL (ref 0.1–1.0)
Monocytes Relative: 9.3 % (ref 3.0–12.0)
Neutro Abs: 5 10*3/uL (ref 1.4–7.7)
Neutrophils Relative %: 72 % (ref 43.0–77.0)
Platelets: 186 10*3/uL (ref 150.0–400.0)
RBC: 4.82 Mil/uL (ref 4.22–5.81)
RDW: 15.3 % (ref 11.5–15.5)
WBC: 7 10*3/uL (ref 4.0–10.5)

## 2018-08-04 LAB — COMPREHENSIVE METABOLIC PANEL WITH GFR
ALT: 11 U/L (ref 0–53)
AST: 14 U/L (ref 0–37)
Albumin: 4.3 g/dL (ref 3.5–5.2)
Alkaline Phosphatase: 61 U/L (ref 39–117)
BUN: 18 mg/dL (ref 6–23)
CO2: 28 meq/L (ref 19–32)
Calcium: 10.5 mg/dL (ref 8.4–10.5)
Chloride: 103 meq/L (ref 96–112)
Creatinine, Ser: 0.92 mg/dL (ref 0.40–1.50)
GFR: 97.41 mL/min
Glucose, Bld: 121 mg/dL — ABNORMAL HIGH (ref 70–99)
Potassium: 3.5 meq/L (ref 3.5–5.1)
Sodium: 140 meq/L (ref 135–145)
Total Bilirubin: 0.6 mg/dL (ref 0.2–1.2)
Total Protein: 7.2 g/dL (ref 6.0–8.3)

## 2018-08-04 LAB — HEMOGLOBIN A1C: Hgb A1c MFr Bld: 6.1 % (ref 4.6–6.5)

## 2018-08-04 LAB — VITAMIN B12: Vitamin B-12: 694 pg/mL (ref 211–911)

## 2018-08-04 NOTE — Assessment & Plan Note (Signed)
Check lipid panel  Continue daily statin Regular exercise and healthy diet encouraged  

## 2018-08-04 NOTE — Assessment & Plan Note (Addendum)
Has occasional burning in feet He can tolerate it-does not want to retry gabapentin Check b12 level

## 2018-08-04 NOTE — Assessment & Plan Note (Signed)
Smoking cessation was discussed for more than 3 minutes.  The patient was counseled on the dangers of tobacco use, and was advised to quit and Wants to quit.  Specifically discussed increased risk of cancer, heart disease, stroke, decreased peripheral circulation.  Reviewed ways of quitting smoking including nicotine replacement, vapping/e-cigarettes, cold [place], weaning off cigarettes, and pharmacotherapy (wellbutrin and chantix).  He is interested in trying to quit and is thinking about calling for nicotine patches.  He states he has the number at home, but has just not called.  Stressed the importance of calling and getting the nicotine patches and that may give him a little more motivation to quit.

## 2018-08-04 NOTE — Assessment & Plan Note (Signed)
He is not exercising regularly, but feels he is eating well Taking metformin Will check A1c Encouraged regular exercise

## 2018-08-04 NOTE — Assessment & Plan Note (Signed)
Started about 4 weeks ago without obvious cause Pain is tolerable and he would prefer not to start any medication We will get an x-ray today since this pain is new Discussed possible referral to sports medicine-he will let me know if he wants to do that He will call back if his pain is not controlled for either a referral to sports medicine for medication Avoid bending, lifting and twisting

## 2018-08-04 NOTE — Assessment & Plan Note (Signed)
Blood pressure slightly elevated here today, but typically well controlled Continue current medications at current doses CMP, CBC

## 2018-08-08 ENCOUNTER — Telehealth: Payer: Self-pay | Admitting: Internal Medicine

## 2018-08-08 DIAGNOSIS — C672 Malignant neoplasm of lateral wall of bladder: Secondary | ICD-10-CM | POA: Diagnosis not present

## 2018-08-08 NOTE — Telephone Encounter (Signed)
Lab results from 5.28 given and patient expressed understanding

## 2018-09-05 ENCOUNTER — Other Ambulatory Visit: Payer: Self-pay | Admitting: Internal Medicine

## 2018-09-22 ENCOUNTER — Other Ambulatory Visit: Payer: Self-pay | Admitting: Internal Medicine

## 2018-10-06 ENCOUNTER — Other Ambulatory Visit: Payer: Self-pay | Admitting: Internal Medicine

## 2018-10-25 DIAGNOSIS — Z1211 Encounter for screening for malignant neoplasm of colon: Secondary | ICD-10-CM | POA: Diagnosis not present

## 2018-10-25 DIAGNOSIS — K5730 Diverticulosis of large intestine without perforation or abscess without bleeding: Secondary | ICD-10-CM | POA: Diagnosis not present

## 2018-10-25 DIAGNOSIS — Z8371 Family history of colonic polyps: Secondary | ICD-10-CM | POA: Diagnosis not present

## 2018-10-25 DIAGNOSIS — Z800 Family history of malignant neoplasm of digestive organs: Secondary | ICD-10-CM | POA: Diagnosis not present

## 2018-11-21 DIAGNOSIS — Z1211 Encounter for screening for malignant neoplasm of colon: Secondary | ICD-10-CM | POA: Diagnosis not present

## 2018-11-21 DIAGNOSIS — Z800 Family history of malignant neoplasm of digestive organs: Secondary | ICD-10-CM | POA: Diagnosis not present

## 2018-11-21 DIAGNOSIS — K621 Rectal polyp: Secondary | ICD-10-CM | POA: Diagnosis not present

## 2018-11-21 DIAGNOSIS — D122 Benign neoplasm of ascending colon: Secondary | ICD-10-CM | POA: Diagnosis not present

## 2018-11-21 DIAGNOSIS — D120 Benign neoplasm of cecum: Secondary | ICD-10-CM | POA: Diagnosis not present

## 2018-11-21 DIAGNOSIS — K625 Hemorrhage of anus and rectum: Secondary | ICD-10-CM | POA: Diagnosis not present

## 2018-11-21 DIAGNOSIS — K635 Polyp of colon: Secondary | ICD-10-CM | POA: Diagnosis not present

## 2018-11-21 LAB — HM COLONOSCOPY

## 2018-12-28 ENCOUNTER — Other Ambulatory Visit: Payer: Self-pay | Admitting: Internal Medicine

## 2019-01-20 DIAGNOSIS — H401231 Low-tension glaucoma, bilateral, mild stage: Secondary | ICD-10-CM | POA: Diagnosis not present

## 2019-01-22 ENCOUNTER — Other Ambulatory Visit: Payer: Self-pay | Admitting: Internal Medicine

## 2019-02-06 NOTE — Patient Instructions (Addendum)
  Tests ordered today. Your results will be released to [place] (or called to you) after review.  If any changes need to be made, you will be notified at that same time.  Flu immunization administered today.    Medications reviewed and updated.  Changes include :   Gabapentin as needed for your feet burning  Your prescription(s) have been submitted to your pharmacy. Please take as directed and contact our office if you believe you are having problem(s) with the medication(s).    Please followup in 6 months

## 2019-02-06 NOTE — Progress Notes (Signed)
Subjective:    Patient ID: Colin Love, male    DOB: Sep 15, 1945, 73 y.o.   MRN: [id number]  HPI The patient is here for follow up.  He is not exercising regularly.   He is doing yard work.  He works still and does a lot of walking at work.    He still has the pain in his right lower back and it radiates to the right knee.   He has intermittent numbness.  The pain is fairly constant.  It is worse with activities.    Diabetes: He is taking his medication daily as prescribed. He is compliant with a diabetic diet.   He checks his feet daily and denies foot lesions. He is up-to-date with an ophthalmology examination.   Burning in feet:  Gabapentin in the past did not help.  He has intermittent burning.  He would like to take something as needed. It bothers him mostly during the day.    Hypertension: He is taking his medication daily. He is compliant with a low sodium diet.  He denies chest pain, palpitations, edema, shortness of breath and regular headaches.    Hyperlipidemia: He is taking his medication daily. He is compliant with a low fat/cholesterol diet. He denies myalgias.     Medications and allergies reviewed with patient and updated if appropriate.  Patient Active Problem List   Diagnosis Date Noted  . Sciatica of right side 08/04/2018  . Abnormal echocardiogram 05/26/2016  . Burning sensation of feet 05/26/2016  . Sudden right hearing loss 08/07/2015  . Irregular heart rhythm 08/07/2015  . Glaucoma 05/21/2015  . PAC (premature atrial contraction) 01/24/2014  . Bladder cancer ([place]) 01/09/2014  . Tobacco abuse   . Diabetes type 2, controlled ([place]) 12/23/2009  . Dyslipidemia 12/23/2009  . Essential hypertension 12/23/2009  . COLONIC POLYPS, HX OF 12/23/2009  . DIVERTICULITIS, HX OF 12/23/2009    Current Outpatient Medications on File Prior to Visit  Medication Sig Dispense Refill  . ACCU-CHEK SOFTCLIX LANCETS lancets TEST UP TO 4 TIMES A DAY 300 each 1  .  aspirin 81 MG tablet Take 81 mg by mouth daily.      Marland Kitchen atorvastatin (LIPITOR) 20 MG tablet TAKE 1 TABLET BY MOUTH ONCE DAILY AT 6PM 90 tablet 1  . blood glucose meter kit and supplies KIT Dispense based on patient and insurance preference. Use up to four times daily as directed. (FOR ICD-9 250.00, 250.01). 1 each 0  . diltiazem (CARDIZEM CD) 180 MG 24 hr capsule TAKE 1 CAPSULE BY MOUTH 2 TIMES A DAY 180 capsule 1  . latanoprost (XALATAN) 0.005 % ophthalmic solution Place 1 drop into both eyes at bedtime.     Marland Kitchen lisinopril-hydrochlorothiazide (ZESTORETIC) 20-12.5 MG tablet TAKE 1 TABLET BY MOUTH 2 TIMES A DAY 180 tablet 1  . ONE TOUCH ULTRA TEST test strip TEST UP TO 4 TIMES DAILY 100 each 1  . potassium chloride (K-DUR) 10 MEQ tablet Take 1 tablet (10 mEq total) by mouth daily. Take 1 tablets (20 mEq total) once daily. 180 tablet 1  . terazosin (HYTRIN) 10 MG capsule TAKE 1 CAPSULE BY MOUTH EVERYDAY AT BEDTIME 90 capsule 1   No current facility-administered medications on file prior to visit.     Past Medical History:  Diagnosis Date  . Adenoma of left adrenal gland   . At risk for sleep apnea    STOP-BANG= 4    SENT TO PCP 02-06-2014  . Bladder cancer (  [place]) 02/2014   local, low grade pap uro carcinoma  . BPH (benign prostatic hypertrophy)   . Diverticulosis of colon   . Full dentures   . GERD (gastroesophageal reflux disease)   . History of adenomatous polyp of colon   . History of diverticulitis of colon   . Hyperlipidemia   . Hypertension   . Murmur, cardiac   . PAC (premature atrial contraction)   . Type 2 diabetes mellitus ([place])     Past Surgical History:  Procedure Laterality Date  . ANTERIOR CERVICAL DECOMP/DISCECTOMY FUSION  05-24-2003   C4  --  C7  . COLONOSCOPY W/ POLYPECTOMY  last one 07-24-2013 w/ EGD  . CYSTOSCOPY W/ RETROGRADES Bilateral 02/09/2014   Procedure: BILATERAL RETROGRADE PYELOGRAM , Cystoscopy;  Surgeon: Ardis Hughs, MD;  Location: [facility];  Service: Urology;  Laterality: Bilateral;  . TRANSURETHRAL RESECTION OF BLADDER TUMOR WITH GYRUS (TURBT-GYRUS) N/A 02/09/2014   Procedure: Bladder Biopsies;  Surgeon: Ardis Hughs, MD;  Location: [facility];  Service: Urology;  Laterality: N/A;    Social History   Socioeconomic History  . Marital status: Married    Spouse name: Not on file  . Number of children: Not on file  . Years of education: Not on file  . Highest education level: Not on file  Occupational History  . Not on file  Social Needs  . Financial resource strain: Not hard at all  . Food insecurity    Worry: Never true    Inability: Never true  . Transportation needs    Medical: No    Non-medical: No  Tobacco Use  . Smoking status: Current Every Day Smoker    Packs/day: 0.50    Years: 40.00    Pack years: 20.00    Types: Cigarettes  . Smokeless tobacco: Never Used  Substance and Sexual Activity  . Alcohol use: Yes    Comment: OCCASIONAL  . Drug use: No  . Sexual activity: Not Currently  Lifestyle  . Physical activity    Days per week: 0 days    Minutes per session: 0 min  . Stress: Not at all  Relationships  . Social connections    Talks on phone: More than three times a week    Gets together: More than three times a week    Attends religious service: More than 4 times per year    Active member of club or organization: Yes    Attends meetings of clubs or organizations: More than 4 times per year    Relationship status: Married  Other Topics Concern  . Not on file  Social History Narrative  . Not on file    Family History  Problem Relation Age of Onset  . Colon cancer Mother   . Hypertension Mother   . Hyperlipidemia Mother   . Hypertension Father   . Hyperlipidemia Father   . Diabetes Brother     Review of Systems  Constitutional: Negative for chills and fever.  Respiratory: Negative for cough, shortness of breath and wheezing.   Cardiovascular:  Negative for chest pain, palpitations and leg swelling.  Musculoskeletal: Positive for back pain.  Neurological: Positive for numbness. Negative for light-headedness and headaches.       Objective:   Vitals:   02/07/19 0917  BP: 132/78  Pulse: (!) 52  Resp: 16  Temp: 98.3 F (36.8 C)  SpO2: 98%   BP Readings from Last 3 Encounters:  02/07/19 132/78  08/04/18 (!) 146/78  11/29/17 128/66   Wt Readings from Last 3 Encounters:  02/07/19 193 lb (87.5 kg)  08/04/18 191 lb (86.6 kg)  11/29/17 192 lb (87.1 kg)   Body mass index is 30.23 kg/m.   Physical Exam    Constitutional: Appears well-developed and well-nourished. No distress.  HENT:  Head: Normocephalic and atraumatic.  Neck: Neck supple. No tracheal deviation present. No thyromegaly present.  No cervical lymphadenopathy Cardiovascular: Normal rate, regular rhythm and normal heart sounds.   No murmur heard. No carotid bruit .  No edema Pulmonary/Chest: Effort normal and breath sounds normal. No respiratory distress. No has no wheezes. No rales.  Skin: Skin is warm and dry. Not diaphoretic.  Psychiatric: Normal mood and affect. Behavior is normal.      Assessment & Plan:    See Problem List for Assessment and Plan of chronic medical problems.    This visit occurred during the SARS-CoV-2 public health emergency.  Safety protocols were in place, including screening questions prior to the visit, additional usage of staff PPE, and extensive cleaning of exam room while observing appropriate contact time as indicated for disinfecting solutions.

## 2019-02-07 ENCOUNTER — Ambulatory Visit (INDEPENDENT_AMBULATORY_CARE_PROVIDER_SITE_OTHER): Payer: Medicare HMO | Admitting: Internal Medicine

## 2019-02-07 ENCOUNTER — Other Ambulatory Visit: Payer: Self-pay

## 2019-02-07 ENCOUNTER — Other Ambulatory Visit (INDEPENDENT_AMBULATORY_CARE_PROVIDER_SITE_OTHER): Payer: Medicare HMO

## 2019-02-07 ENCOUNTER — Encounter: Payer: Self-pay | Admitting: Internal Medicine

## 2019-02-07 VITALS — BP 132/78 | HR 52 | Temp 98.3°F | Resp 16 | Ht 67.0 in | Wt 193.0 lb

## 2019-02-07 DIAGNOSIS — Z23 Encounter for immunization: Secondary | ICD-10-CM | POA: Diagnosis not present

## 2019-02-07 DIAGNOSIS — E119 Type 2 diabetes mellitus without complications: Secondary | ICD-10-CM

## 2019-02-07 DIAGNOSIS — I10 Essential (primary) hypertension: Secondary | ICD-10-CM | POA: Diagnosis not present

## 2019-02-07 DIAGNOSIS — C679 Malignant neoplasm of bladder, unspecified: Secondary | ICD-10-CM | POA: Diagnosis not present

## 2019-02-07 DIAGNOSIS — E785 Hyperlipidemia, unspecified: Secondary | ICD-10-CM | POA: Diagnosis not present

## 2019-02-07 DIAGNOSIS — M5431 Sciatica, right side: Secondary | ICD-10-CM

## 2019-02-07 DIAGNOSIS — Z720 Tobacco use: Secondary | ICD-10-CM

## 2019-02-07 LAB — LIPID PANEL
Cholesterol: 125 mg/dL (ref 0–200)
HDL: 45.9 mg/dL
LDL Cholesterol: 64 mg/dL (ref 0–99)
NonHDL: 79.22
Total CHOL/HDL Ratio: 3
Triglycerides: 76 mg/dL (ref 0.0–149.0)
VLDL: 15.2 mg/dL (ref 0.0–40.0)

## 2019-02-07 LAB — HEMOGLOBIN A1C: Hgb A1c MFr Bld: 6.2 % (ref 4.6–6.5)

## 2019-02-07 LAB — COMPREHENSIVE METABOLIC PANEL WITH GFR
ALT: 12 U/L (ref 0–53)
AST: 15 U/L (ref 0–37)
Albumin: 4.1 g/dL (ref 3.5–5.2)
Alkaline Phosphatase: 63 U/L (ref 39–117)
BUN: 16 mg/dL (ref 6–23)
CO2: 25 meq/L (ref 19–32)
Calcium: 9.9 mg/dL (ref 8.4–10.5)
Chloride: 104 meq/L (ref 96–112)
Creatinine, Ser: 0.82 mg/dL (ref 0.40–1.50)
GFR: 111.08 mL/min
Glucose, Bld: 119 mg/dL — ABNORMAL HIGH (ref 70–99)
Potassium: 3.4 meq/L — ABNORMAL LOW (ref 3.5–5.1)
Sodium: 138 meq/L (ref 135–145)
Total Bilirubin: 0.6 mg/dL (ref 0.2–1.2)
Total Protein: 6.9 g/dL (ref 6.0–8.3)

## 2019-02-07 MED ORDER — GABAPENTIN 100 MG PO CAPS: 100.0000 mg | ORAL_CAPSULE | Freq: Every day | ORAL | 3 refills | Status: DC | PRN

## 2019-02-07 MED ORDER — METFORMIN HCL 500 MG PO TABS: ORAL_TABLET | ORAL | 1 refills | Status: DC

## 2019-02-07 NOTE — Assessment & Plan Note (Signed)
Following with urology

## 2019-02-07 NOTE — Assessment & Plan Note (Signed)
BP well controlled Current regimen effective and well tolerated Continue current medications at current doses cmp  

## 2019-02-07 NOTE — Assessment & Plan Note (Signed)
Check a1c, cmp, lipids Low sugar / carb diet Stressed regular exercise Continue metformin

## 2019-02-07 NOTE — Addendum Note (Signed)
Addended by: Delice Bison E on: 02/07/2019 10:29 AM   Modules accepted: Orders

## 2019-02-07 NOTE — Assessment & Plan Note (Signed)
Still having right lower back pain with radiation to knee Associated with some numbness Will refer to sports medicine

## 2019-02-07 NOTE — Assessment & Plan Note (Signed)
Check lipid panel  Continue daily statin Regular exercise and healthy diet encouraged  

## 2019-02-07 NOTE — Assessment & Plan Note (Signed)
Advised quitting smoking

## 2019-02-09 ENCOUNTER — Other Ambulatory Visit: Payer: Self-pay | Admitting: Internal Medicine

## 2019-02-09 MED ORDER — POTASSIUM CHLORIDE ER 10 MEQ PO TBCR: 30.0000 meq | EXTENDED_RELEASE_TABLET | Freq: Every day | ORAL | 1 refills | Status: DC

## 2019-02-20 ENCOUNTER — Other Ambulatory Visit: Payer: Self-pay

## 2019-02-21 ENCOUNTER — Ambulatory Visit (INDEPENDENT_AMBULATORY_CARE_PROVIDER_SITE_OTHER): Payer: Medicare HMO | Admitting: Family Medicine

## 2019-02-21 ENCOUNTER — Encounter: Payer: Self-pay | Admitting: Family Medicine

## 2019-02-21 ENCOUNTER — Ambulatory Visit (INDEPENDENT_AMBULATORY_CARE_PROVIDER_SITE_OTHER): Payer: Medicare HMO

## 2019-02-21 VITALS — BP 158/78 | HR 80 | Ht 67.0 in | Wt 194.8 lb

## 2019-02-21 DIAGNOSIS — M25562 Pain in left knee: Secondary | ICD-10-CM

## 2019-02-21 DIAGNOSIS — R29898 Other symptoms and signs involving the musculoskeletal system: Secondary | ICD-10-CM | POA: Diagnosis not present

## 2019-02-21 DIAGNOSIS — M5416 Radiculopathy, lumbar region: Secondary | ICD-10-CM

## 2019-02-21 DIAGNOSIS — M1712 Unilateral primary osteoarthritis, left knee: Secondary | ICD-10-CM | POA: Diagnosis not present

## 2019-02-21 NOTE — Progress Notes (Signed)
Subjective:    I'm seeing this patient as a consultation for:  Dr. Quay Burow  CC: Low back pain and R LE sciatica and L knee  I, Molly Weber, LAT, ATC, am serving as scribe for Dr. Lynne Leader.  HPI: Pt is a 73 y/o male presenting w/ c/o low back pain and radicular R LE pain to the knee x approximately 6 months.  Pt also reports intermittent numbness into his R LE.  He saw his PCP in May and again on 02/07/19 for this issue and had a lumbar spine XR on 08/04/18.  He recalls no specific MOI but notes that his symptoms are aggravated w/ mowing his lawn w/ a push mower and w/ weed-eating.  Pt rates his low back pain and R leg pain as a 5/10 and describes it as a throbbing/numb sensation.  Pain is located predominantly at the anterior thigh and associated with numbness.  He feels a bit of weakness at times with going up and down stairs and standing from a squatting position.  He denies any sensation that his leg gives way.  He does have a pertinent past surgical history for multilevel C-spine fusion in 2005.  L knee:  Pt reports L knee pain x a few weeks after doing some work at his house on his knees.  He reports some popping and grinding in his L anterior knee.  Pt rates his pain as an intermittent pain that is currently a 3/10 and a 6-7/10 at it's worst.  Aggravating factors include squatting or "stooping down" and descending stairs.  He notes that he was doing quite a bit of kneeling before the pain started.  Symptoms ongoing for few weeks.  He has tried some over-the-counter medicines for pain which helps a little.  Past medical history, Surgical history, Family history not pertinant except as noted below, Social history, Allergies, and medications have been entered into the medical record, reviewed, and no changes needed.   Review of Systems: No headache, visual changes, nausea, vomiting, diarrhea, constipation, dizziness, abdominal pain, skin rash, fevers, chills, night sweats, weight loss, swollen  lymph nodes, body aches, joint swelling, muscle aches, chest pain, shortness of breath, mood changes, visual or auditory hallucinations.   Objective:    Vitals:   02/21/19 0841  BP: (!) 158/78  Pulse: 80  SpO2: 98%   General: Well Developed, well nourished, and in no acute distress.  Neuro/Psych: Alert and oriented x3, extra-ocular muscles intact, able to move all 4 extremities, sensation grossly intact. Skin: Warm and dry, no rashes noted.  Respiratory: Not using accessory muscles, speaking in full sentences, trachea midline.  Cardiovascular: Pulses palpable, no extremity edema. Abdomen: Does not appear distended. MSK:  L-spine: Normal appearing. Nontender midline.  Minimally tender bilateral paraspinal musculature. Motion: Slightly limited extension.  Normal rotation and lateral flexion.  Limited flexion. Lower extremity strength is equal bilateral lower extremities to my exam testing today. Reflexes equal normal bilateral lower extremities. Sensation slightly decreased to light touch anterior thigh right compared to left otherwise normal. Negative slump test bilaterally.  Left knee: Normal-appearing no deformity or significant effusion. Range of motion 0-120 degrees with mild retropatellar crepitation. Mildly tender palpation anterior knee.  No palpable squeak. Stable ligaments exam. Negative McMurray's test. Intact flexion extension strength.  Right knee: Normal-appearing no deformity or significant effusion.  Range of motion 0-120 degrees with mild crepitation. Nontender. Stable ligament exam. Negative Murray's test. Intact flexion extension strength.  Mild antalgic gait.  Lab and Radiology  Results X-ray images left knee obtained today personally and independently reviewed Diffuse degenerative joint disease with chondrocalcinosis present at lateral compartment.  Mild DJD patellofemoral compartment with superior patellar osteophyte present.  No acute fractures  visible. Await formal radiology review  X-ray L-spine date of service 08/04/2018 EXAM: LUMBAR SPINE - COMPLETE 4+ VIEW  COMPARISON:  None.  FINDINGS: Diffuse degenerative disc and facet disease throughout the lumbar spine. Normal alignment. No fracture. SI joints symmetric and unremarkable. Aortoiliac atherosclerosis. No visible aneurysm.  IMPRESSION: Diffuse degenerative disc and facet disease. No acute bony abnormality.   Electronically Signed   By: Rolm Baptise M.D.   On: 08/04/2018 11:35 I, Lynne Leader, personally (independently) visualized and performed the interpretation of the images attached in this note.   Impression and Recommendations:    Assessment and Plan: 73 y.o. male with  Right anterior thigh pain/numbness associated with right low back pain.  Concerning for L3 lumbar radiculopathy.  Patient notes some subjective leg weakness with activities of daily living such as standing up from a squatted position and going up and down stairs.  I am not able to appreciate significant weakness with my exam testing however this is limited by functional exam testing in clinic. He certainly has significant degenerative changes throughout L-spine noted on x-ray May 2020.  It is likely he has effectively a pinched nerve in his back affecting L3 nerve root causing his symptoms.  We discussed treatment plan and options.  Plan for trial of physical therapy.  Recheck in 4 weeks.  If not better next step would be MRI L-spine for epidural steroid injection planning.  Left anterior knee pain: Patient describes new onset anterior knee pain and crepitations after doing a lot of kneeling.  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  Discussed treatment plan and options.  Plan for physical therapy as above.  Additionally will treat with diclofenac gel and recheck in 4 weeks.  If not better next step will be injection.  X-ray over read pending at this time..   Orders Placed This  Encounter  Procedures  . DG Knee Complete 4 Views Left    Standing Status:   Future    Number of Occurrences:   1    Standing Expiration Date:   04/23/2020    Order Specific Question:   Reason for Exam (SYMPTOM  OR DIAGNOSIS REQUIRED)    Answer:   Standing AP and [place] and Lat and sunrise. Eval DJD    Order Specific Question:   Preferred imaging location?    Answer:   [facility]    Order Specific Question:   Radiology Contrast Protocol - do NOT remove file path    Answer:   [email or web address]  . Ambulatory referral to Physical Therapy    Referral Priority:   Routine    Referral Type:   Physical Medicine    Referral Reason:   Specialty Services Required    Requested Specialty:   Physical Therapy   No orders of the defined types were placed in this encounter.   Discussed warning signs or symptoms. Please see discharge instructions. Patient expresses understanding.  The above documentation has been reviewed and is accurate and complete Lynne Leader

## 2019-02-21 NOTE — Patient Instructions (Addendum)
Thank you for coming in today. Use over the counter voltaren gel on the knee up to 4x daily for pain and inflammation.  Attend PT for what I think is a pinched nerve in your back causing leg numbness and weakness and back pain.  Recheck with me in 4 weeks.  Next step is knee injection and back MRI.   Let me know if you are having problems.  Return or recheck sooner if needed.    Radicular Pain Radicular pain is a type of pain that spreads from your back or neck along a spinal nerve. Spinal nerves are nerves that leave the spinal cord and go to the muscles. Radicular pain is sometimes called radiculopathy, radiculitis, or a pinched nerve. When you have this type of pain, you may also have weakness, numbness, or tingling in the area of your body that is supplied by the nerve. The pain may feel sharp and burning. Depending on which spinal nerve is affected, the pain may occur in the:  Neck area (cervical radicular pain). You may also feel pain, numbness, weakness, or tingling in the arms.  Mid-spine area (thoracic radicular pain). You would feel this pain in the back and chest. This type is rare.  Lower back area (lumbar radicular pain). You would feel this pain as low back pain. You may feel pain, numbness, weakness, or tingling in the buttocks or legs. Sciatica is a type of lumbar radicular pain that shoots down the back of the leg. Radicular pain occurs when one of the spinal nerves becomes irritated or squeezed (compressed). It is often caused by something pushing on a spinal nerve, such as one of the bones of the spine (vertebrae) or one of the round cushions between vertebrae (intervertebral disks). This can result from:  An injury.  Wear and tear or aging of a disk.  The growth of a bone spur that pushes on the nerve. Radicular pain often goes away when you follow instructions from your health care provider for relieving pain at home. Follow these instructions at home: Managing  pain      If directed, put ice on the affected area: ? Put ice in a plastic bag. ? Place a towel between your skin and the bag. ? Leave the ice on for 20 minutes, 2-3 times a day.  If directed, apply heat to the affected area as often as told by your health care provider. Use the heat source that your health care provider recommends, such as a moist heat pack or a heating pad. ? Place a towel between your skin and the heat source. ? Leave the heat on for 20-30 minutes. ? Remove the heat if your skin turns bright red. This is especially important if you are unable to feel pain, heat, or cold. You may have a greater risk of getting burned. Activity   Do not sit or rest in bed for long periods of time.  Try to stay as active as possible. Ask your health care provider what type of exercise or activity is best for you.  Avoid activities that make your pain worse, such as bending and lifting.  Do not lift anything that is heavier than 10 lb (4.5 kg), or the limit that you are told, until your health care provider says that it is safe.  Practice using proper technique when lifting items. Proper lifting technique involves bending your knees and rising up.  Do strength and range-of-motion exercises only as told by your health care  provider or physical therapist. General instructions  Take over-the-counter and prescription medicines only as told by your health care provider.  Pay attention to any changes in your symptoms.  Keep all follow-up visits as told by your health care provider. This is important. ? Your health care provider may send you to a physical therapist to help with this pain. Contact a health care provider if:  Your pain and other symptoms get worse.  Your pain medicine is not helping.  Your pain has not improved after a few weeks of home care.  You have a fever. Get help right away if:  You have severe pain, weakness, or numbness.  You have difficulty with  bladder or bowel control. Summary  Radicular pain is a type of pain that spreads from your back or neck along a spinal nerve.  When you have radicular pain, you may also have weakness, numbness, or tingling in the area of your body that is supplied by the nerve.  The pain may feel sharp or burning.  Radicular pain may be treated with ice, heat, medicines, or physical therapy. This information is not intended to replace advice given to you by your health care provider. Make sure you discuss any questions you have with your health care provider. Document Released: 04/02/2004 Document Revised: 09/07/2017 Document Reviewed: 09/07/2017 Elsevier Patient Education  2020 [employer].

## 2019-02-22 NOTE — Progress Notes (Signed)
X-ray knee shows arthritis as well as evidence of pseudogout.  Could treat this with injection especially if not improving with the topical medicine.  Okay to schedule sooner appointment with me for knee injection if desired.

## 2019-02-24 ENCOUNTER — Other Ambulatory Visit: Payer: Self-pay | Admitting: Internal Medicine

## 2019-03-16 ENCOUNTER — Ambulatory Visit: Payer: Medicare HMO | Attending: Family Medicine | Admitting: Physical Therapy

## 2019-03-16 ENCOUNTER — Encounter: Payer: Self-pay | Admitting: Physical Therapy

## 2019-03-16 ENCOUNTER — Other Ambulatory Visit: Payer: Self-pay

## 2019-03-16 DIAGNOSIS — M5416 Radiculopathy, lumbar region: Secondary | ICD-10-CM | POA: Diagnosis present

## 2019-03-16 DIAGNOSIS — M25562 Pain in left knee: Secondary | ICD-10-CM | POA: Diagnosis present

## 2019-03-16 NOTE — Therapy (Signed)
[place], [place], [address] Phone: [phone]   Fax:  [phone]  Physical Therapy Evaluation  Patient Details  Name: Colin Love MRN: [id number] Date of Birth: 11-24-1945 Referring Provider (PT): Lynne Leader, MD   Encounter Date: 03/16/2019  PT End of Session - 03/16/19 1118    Visit Number  1    Number of Visits  8    Date for PT Re-Evaluation  04/27/19    Authorization Type  [employer] Medicare-no ionto, progress note by visit 10    PT Start Time  1015    PT Stop Time  1058    PT Time Calculation (min)  43 min    Activity Tolerance  Patient tolerated treatment well    Behavior During Therapy  [facility] for tasks assessed/performed       Past Medical History:  Diagnosis Date  . Adenoma of left adrenal gland   . At risk for sleep apnea    STOP-BANG= 4    SENT TO PCP 02-06-2014  . Bladder cancer ([place]) 02/2014   local, low grade pap uro carcinoma  . BPH (benign prostatic hypertrophy)   . Diverticulosis of colon   . Full dentures   . GERD (gastroesophageal reflux disease)   . History of adenomatous polyp of colon   . History of diverticulitis of colon   . Hyperlipidemia   . Hypertension   . Murmur, cardiac   . PAC (premature atrial contraction)   . Type 2 diabetes mellitus ([place])     Past Surgical History:  Procedure Laterality Date  . ANTERIOR CERVICAL DECOMP/DISCECTOMY FUSION  05-24-2003   C4  --  C7  . COLONOSCOPY W/ POLYPECTOMY  last one 07-24-2013 w/ EGD  . CYSTOSCOPY W/ RETROGRADES Bilateral 02/09/2014   Procedure: BILATERAL RETROGRADE PYELOGRAM , Cystoscopy;  Surgeon: Ardis Hughs, MD;  Location: [facility];  Service: Urology;  Laterality: Bilateral;  . TRANSURETHRAL RESECTION OF BLADDER TUMOR WITH GYRUS (TURBT-GYRUS) N/A 02/09/2014   Procedure: Bladder Biopsies;  Surgeon: Ardis Hughs, MD;  Location: [facility];  Service: Urology;  Laterality:  N/A;    There were no vitals filed for this visit.   Subjective Assessment - 03/16/19 1022    Subjective  Pt. is a 74 y/o male referred to PT for c/o LBP wth right LE radicular symptoms as well as left knee pain. For back he reports insidious symptom onset about 6 months ago with pain noted in particular with yardwork especially weedeating>pushing mower. Pain radiates intermittently down right leg distal to knee region. He had X-rays which showed diffuse degenerative changes and facet arthritis. For left knee pt. reports onset a little before Christmas after prolonged time spent kneeling while doing work around the house. In general knee pain worse with standing and walking but symptoms can vary. Pain eased with knee brace use.    Pertinent History  diabetic, history bladder CA s/p surgery 5 years ago    Patient Stated Goals  Get back and knee better    Currently in Pain?  Yes    Pain Score  2     Pain Location  Back    Pain Orientation  Right;Lower    Pain Descriptors / Indicators  Numbness    Pain Type  Chronic pain    Pain Radiating Towards  right leg distal to knee    Pain Onset  More than a month ago    Pain Frequency  Intermittent    Aggravating Factors   yardwork, activity    Pain Relieving Factors  rest    Effect of Pain on Daily Activities  limits ability chores, IADLS    Multiple Pain Sites  Yes    Pain Score  4    Pain Location  Knee    Pain Orientation  Left    Pain Descriptors / Indicators  --   "hurting"   Pain Type  Acute pain    Pain Onset  1 to 4 weeks ago    Pain Frequency  Intermittent    Aggravating Factors   sometimes worse with walking but symptoms vary    Pain Relieving Factors  rest at times otherwise not specific eases noted    Effect of Pain on Daily Activities  limits walking tolerance         [facility] PT Assessment - 03/16/19 0001      Assessment   Medical Diagnosis  Lumbar radiculopathy, acute left knee pain    Referring Provider (PT)  Lynne Leader,  MD    Onset Date/Surgical Date  09/07/18    Hand Dominance  Left    Prior Therapy  none      Precautions   Precautions  None      Restrictions   Weight Bearing Restrictions  No      Balance Screen   Has the patient fallen in the past 6 months  No      [place] residence    Living Arrangements  Spouse/significant other    Type of [place] Access  Stairs to enter    Entrance Stairs-Number of Steps  2    Entrance Stairs-Rails  Right;Left    Home Layout  One level      Prior Function   Level of Independence  Independent with community mobility without device      Cognition   Overall Cognitive Status  Within Functional Limits for tasks assessed      Observation/Other Assessments   Focus on Therapeutic Outcomes (FOTO)   38% limited      Sensation   Additional Comments  Numbness right proximal thigh      ROM / Strength   AROM / PROM / Strength  AROM;PROM;Strength      AROM   Overall AROM Comments  Increased local right lumbar pain with trunk extension and right sidebending    AROM Assessment Site  Knee;Lumbar    Right/Left Knee  Right;Left    Right Knee Extension  0    Right Knee Flexion  132    Left Knee Extension  0    Left Knee Flexion  112    Lumbar Flexion  85    Lumbar Extension  25    Lumbar - Right Side Bend  28    Lumbar - Left Side Bend  27    Lumbar - Right Rotation  WFL    Lumbar - Left Rotation  WFL      PROM   PROM Assessment Site  Knee    Right/Left Knee  Left    Left Knee Flexion  118   limited by pain     Strength   Overall Strength Comments  Bilat. LE MMTs grossly 5/5 excepting bilat. hip extension 4+/5, right hip abduction/adduction 4+/5      Flexibility   Soft Tissue Assessment /Muscle Length  --   hamstring tightness with SLR 60  deg bilat.     Palpation   SI assessment   no innomnate rotation noted      Special Tests   Other special tests  SLR (-)                Objective  measurements completed on examination: See above findings.      [place] Adult PT Treatment/Exercise - 03/16/19 0001      Exercises   Exercises  Lumbar;Knee/Hip      Lumbar Exercises: Stretches   Single Knee to Chest Stretch  Right;Left;3 reps    Pelvic Tilt  5 reps    Other Lumbar Stretch Exercise  HEP instruction seated hamstring stretch      Lumbar Exercises: Supine   Bent Knee Raise  5 reps      Knee/Hip Exercises: Seated   Sit to Sand  --   HEP instruction sit>stand/"touch and go" squat f     Knee/Hip Exercises: Supine   Quad Sets  AROM;Strengthening;Left;5 reps    Heel Slides Limitations  instructed HEP for knee ROM    Bridges  AROM;Strengthening;Both;5 reps    Straight Leg Raises  AROM;Strengthening;Left;5 reps             PT Education - 03/16/19 1246    Education Details  HEP, spine and knee anatomy with potential symptom etiology, POC    Person(s) Educated  Patient    Methods  Explanation;Demonstration;Tactile cues;Verbal cues;Handout    Comprehension  Verbalized understanding;Returned demonstration;Verbal cues required;Tactile cues required          PT Long Term Goals - 03/16/19 1255      PT LONG TERM GOAL #1   Title  Independent with HEP    Baseline  instructed today    Time  6    Period  Weeks    Status  New    Target Date  04/27/19      PT LONG TERM GOAL #2   Title  Increase left knee flexion AROM at least 5-10 deg to improve ability to navigate stairs and perform transfers from low seats    Baseline  112 deg    Time  6    Period  Weeks    Status  New    Target Date  04/27/19      PT LONG TERM GOAL #3   Title  Increase bilat. hip extension strength 1/2 MMT grade to improve ability for transfers from low seats    Baseline  4+/5    Time  6    Period  Weeks    Status  New    Target Date  04/27/19      PT LONG TERM GOAL #4   Title  Tolerate yardwork and ambulation for home and community mobility periods at least 20-30 min for shopping  with back and knee pain decreased at least 50% from baseline    Time  6    Period  Weeks    Status  New    Target Date  04/27/19      PT LONG TERM GOAL #5   Title  Improve FOTO outcome measure score to 33% or less impairment    Baseline  38% limited    Time  6    Period  Weeks    Status  New    Target Date  04/27/19             Plan - 03/16/19 1247    Clinical Impression Statement  Back:  Pt. presents with LBP with right LE radicular symptoms with flexion bias for trunk ROM consistent with imaging findings of DDD, facet arthritis and would suspect possible underlying stenosis. Pt. instructed in HEP for flexion bias ROM and core/back strengthening and flexibility to address. For knee pain pt. presents with left knee pain with edema, decreased ROM/stiffness and hip>knee muscle weakness. Would suspect underlying degenerative changes exacerbated from kneeling for housework as note in subjective. Pt. would benefit from PT to help relieve pain and address current associated functional limitations for back and knee. He was instructed in HEP and states preference to try continuing independently via HEP with plan of care to remain open for up to 6 weeks in case of need for return to review and progress HEP.    Personal Factors and Comorbidities  Comorbidity 2;Other   multiple tx. areas   Comorbidities  diabetic, cancer history    Examination-Activity Limitations  Lift;Squat;Stairs;Stand;Locomotion Level    Examination-Participation Restrictions  Yard Work;Community Activity    Stability/Clinical Decision Making  Evolving/Moderate complexity    Clinical Decision Making  Moderate    Rehab Potential  Good    PT Frequency  --   1-2x/week   PT Duration  6 weeks    PT Treatment/Interventions  ADLs/Self Care Home Management;Cryotherapy;Electrical Stimulation;Ultrasound;Moist Heat;Iontophoresis 4mg /ml Dexamethasone;Therapeutic activities;Gait training;Therapeutic exercise;Neuromuscular  re-education;Dry needling;Vasopneumatic Device;Patient/family education;Manual techniques;Taping    PT Next Visit Plan  If returning review HEP as needed and progress flexion bias trunk ROM and core stabilization, hamstring flexbility, left knee ROM and strengthening as tolerated pending pain    PT Home Exercise Plan  pelvic tilts, SKTC, supine marches, hip bridge, seated hamstring stretch, quad sets, supine SLR, sit>stand from edge of bed vs. chair, heel slides    Consulted and Agree with Plan of Care  Patient       Patient will benefit from skilled therapeutic intervention in order to improve the following deficits and impairments:  Pain, Impaired flexibility, Decreased strength, Decreased activity tolerance, Decreased range of motion, Increased edema, Impaired sensation, Difficulty walking  Visit Diagnosis: Radiculopathy, lumbar region  Acute pain of left knee     Problem List Patient Active Problem List   Diagnosis Date Noted  . Sciatica of right side 08/04/2018  . Abnormal echocardiogram 05/26/2016  . Burning sensation of feet 05/26/2016  . Sudden right hearing loss 08/07/2015  . Irregular heart rhythm 08/07/2015  . Glaucoma 05/21/2015  . PAC (premature atrial contraction) 01/24/2014  . Bladder cancer ([place]) 01/09/2014  . Tobacco abuse   . Diabetes type 2, controlled ([place]) 12/23/2009  . Dyslipidemia 12/23/2009  . Essential hypertension 12/23/2009  . COLONIC POLYPS, HX OF 12/23/2009  . DIVERTICULITIS, HX OF 12/23/2009    Beaulah Dinning, PT, DPT 03/16/19 12:59 PM  [facility] [address] [place], [place], [address] Phone: [phone]   Fax:  [phone]  Name: Colin Love MRN: [id number] Date of Birth: 08-21-1945

## 2019-03-21 ENCOUNTER — Ambulatory Visit: Payer: Self-pay

## 2019-03-21 ENCOUNTER — Other Ambulatory Visit: Payer: Self-pay

## 2019-03-21 ENCOUNTER — Ambulatory Visit (INDEPENDENT_AMBULATORY_CARE_PROVIDER_SITE_OTHER): Payer: Medicare HMO | Admitting: Family Medicine

## 2019-03-21 ENCOUNTER — Encounter: Payer: Self-pay | Admitting: Family Medicine

## 2019-03-21 VITALS — BP 142/82 | HR 90 | Ht 67.0 in | Wt 193.0 lb

## 2019-03-21 DIAGNOSIS — M79651 Pain in right thigh: Secondary | ICD-10-CM | POA: Diagnosis not present

## 2019-03-21 DIAGNOSIS — M11262 Other chondrocalcinosis, left knee: Secondary | ICD-10-CM | POA: Diagnosis not present

## 2019-03-21 DIAGNOSIS — M25562 Pain in left knee: Secondary | ICD-10-CM | POA: Diagnosis not present

## 2019-03-21 DIAGNOSIS — M79652 Pain in left thigh: Secondary | ICD-10-CM

## 2019-03-21 MED ORDER — COLCHICINE 0.6 MG PO TABS: 0.6000 mg | ORAL_TABLET | Freq: Every day | ORAL | 1 refills | Status: DC | PRN

## 2019-03-21 NOTE — Progress Notes (Signed)
I, Wendy Poet, LAT, ATC, am serving as scribe for Dr. Lynne Leader.  Colin Love is a 74 y.o. male who presents to [place] today for f/u of LBP w/ radicular R LE pain and L knee pain.  He was last seen by Dr. Georgina Snell on 02/21/19 and rated his low back pain and R leg pain as a 5/10 and described the pain as a throbbing/numb pain.  He was referred to outpatient PT and has completed one visit to date.  Since his last visit, pt reports that he is feeling better, rating his pain at a 4/10.  Pt has been completing his HEP daily to bid.  L knee: L knee pain x 6-8 weeks after doing work at his house where he spend a prolonged period of time on his knees.  Previously noted his L knee pain was a 3/10 and a 6-7/10 at it's worst.  Since his last visit, pt reports improvement in his L knee pain.  He has used the Voltaren gel intermittently and is doing his HEP as prescribed by PT 1-2x/day.  Previous diagnostic testing: L-spine XR-08/04/18 and L knee XR-02/21/19  Relevant historical information: No known history of pseudogout   ROS:  As above  Exam:  BP (!) 142/82 (BP Location: Left Arm, Patient Position: Sitting, Cuff Size: Normal)   Pulse 90   Ht 5\' 7"  (1.702 m)   Wt 193 lb (87.5 kg)   SpO2 97%   BMI 30.23 kg/m   MSK:  L-spine: Nontender to spinal midline normal lumbar motion.  Lower extremity strength is intact. Left knee: Moderate effusion otherwise normal-appearing Range of motion 0-120 degrees with retropatellar crepitation. Nontender. Normal strength. Stable ligamentous exam testing. Negative McMurray's test.    Lab and Radiology Results DG Knee Complete 4 Views Left  Result Date: 02/21/2019 CLINICAL DATA:  Acute left knee pain. EXAM: LEFT KNEE - COMPLETE 4+ VIEW COMPARISON:  None. FINDINGS: No evidence of fracture, dislocation, or joint effusion. Mild narrowing of medial joint space is noted with osteophyte formation. Chondrocalcinosis is  noted in the lateral joint space. Mild patellar spurring is noted. Vascular calcifications are noted. IMPRESSION: Mild degenerative joint disease is noted medially. Chondrocalcinosis is noted in the lateral joint space which may represent degenerative change or possibly calcium pyrophosphate deposition disease. No acute abnormality seen in the left knee. Electronically Signed   By: Marijo Conception M.D.   On: 02/21/2019 10:17   I, Lynne Leader, personally (independently) visualized and performed the interpretation of the images attached in this note.   Procedure: Real-time Ultrasound Guided Injection of left knee lateral superior patellar joint capsule Device: Philips Affiniti 50G Images permanently stored and available for review in the ultrasound unit. Verbal informed consent obtained.  Discussed risks and benefits of procedure. Warned about infection bleeding damage to structures skin hypopigmentation and fat atrophy among others. Patient expresses understanding and agreement Time-out conducted.   Noted no overlying erythema, induration, or other signs of local infection.   Skin prepped in a sterile fashion.   Local anesthesia: Topical Ethyl chloride.   With sterile technique and under real time ultrasound guidance:  40 mg of Depo-Medrol and 4 mL of Marcaine injected easily.   Completed without difficulty   Pain immediately resolved suggesting accurate placement of the medication.   Advised to call if fevers/chills, erythema, induration, drainage, or persistent bleeding.   Images permanently stored and available for review in the ultrasound unit.  Impression:  Technically successful ultrasound guided injection.        Assessment and Plan: 74 y.o. male with bilateral thigh pain due to most likely to lumbar radiculopathy at L3.  Patient has had considerable improvement with physical therapy and home exercise.  He notes his pain is mostly resolved and notes significant increase strength and  less dysfunction with normal activities of daily living.  Plan to continue home exercise program and intermittent physical therapy and recheck in 2 months.  Left knee pain and swelling: Multifactorial.  I surprised to see chondrocalcinosis on x-ray obtained after last visit.  Additionally he has DJD/OA changes as well.  Pain is likely due to combination of both of these.  Plan to proceed with steroid injection as above.  We will also use intermittent low-dose colchicine as needed for flareups of presumed pseudogout.  Of note he has 2 drug drug interactions with pseudogout including diltiazem and atorvastatin.  Both of these medications are at relatively low doses as is colchicine.  His renal function is normal when reviewed most recently.  I believe the benefit of very infrequent low-dose colchicine outweighs the risk of drug drug interaction.  We discussed these potential drug interactions during the clinic visit.  If he has side effects we will discontinue colchicine and consider other options.   Orders Placed This Encounter  Procedures  . No Chg  [place] Lower Lt    Order Specific Question:   Reason for Exam (SYMPTOM  OR DIAGNOSIS REQUIRED)    Answer:   left knee inj    Order Specific Question:   Preferred imaging location?    Answer:   [facility]    Order Specific Question:   Release to patient    Answer:   Immediate   Meds ordered this encounter  Medications  . colchicine 0.6 MG tablet    Sig: Take 1 tablet (0.6 mg total) by mouth daily as needed (knee pain due to psuedogout).    Dispense:  30 tablet    Refill:  1     Discussed warning signs or symptoms. Please see discharge instructions. Patient expresses understanding.  The above documentation has been reviewed and is accurate and complete Lynne Leader

## 2019-03-21 NOTE — Patient Instructions (Signed)
Thank you for coming in today. Call or go to the ER if you develop a large red swollen joint with extreme pain or oozing puss.   Take colchicine daily as needed for knee pain and swelling due to pseudogout.   Continue pt and home exercises.   Recheck with me in 2 months.  Return sooner if needed.    Psuedogout Calcium Pyrophosphate Deposition Calcium pyrophosphate deposition (CPPD) is a type of arthritis that causes pain, swelling, and inflammation in a joint. Attacks of CPPD may come and go. The joint pain can be severe and may last for days to weeks. This condition usually affects one joint at a time. The knees are most often affected, but this condition can also affect the wrists, elbows, shoulders, or ankles. CPPD may also be called pseudogout because it is similar to gout. Both conditions result from the buildup of crystals in a joint. However, CPPD is caused by a type of crystal that is different from the crystals that cause gout. What are the causes? This condition is caused by the buildup of calcium pyrophosphate dihydrate crystals in a joint. The reason why this buildup occurs is not known. An increased likelihood of having this condition (predisposition) may be passed from parent to child (is hereditary). What increases the risk? This condition is more likely to develop in people who:  Are older than 75 years of age.  Have a family history of CPPD.  Have certain medical conditions, such as hemophilia, amyloidosis, or overactive parathyroid glands.  Have low levels of magnesium in the blood. What are the signs or symptoms? Symptoms of this condition include:  Joint pain. The pain may: ? Be intense and constant. ? Develop quickly. ? Get worse with movement. ? Last from several days to a few weeks.  Redness, swelling, stiffness, and warmth at the joint. How is this diagnosed? To diagnose this condition, your health care provider will use a needle to remove fluid from the  joint. The fluid will be examined for the crystals that cause CPPD. You also may have additional tests, such as:  Blood tests.  X-rays.  Ultrasound.  MRI. How is this treated? There is no way to remove the crystals from the joint and no cure for this condition. However, treatment can relieve symptoms and improve joint function. Treatment may include:  NSAIDs to reduce inflammation and pain.  Removing some of the fluid and crystals from around the joint with a needle.  Injections of medicine (cortisone) into the joint to reduce pain and swelling.  Medicines to help prevent attacks.  Physical therapy to improve joint function. Follow these instructions at home: Managing pain, stiffness, and swelling   Rest the affected joint until your symptoms start to go away.  If directed, put ice on the affected area to relieve pain and swelling: ? Put ice in a plastic bag. ? Place a towel between your skin and the bag. ? Leave the ice on for 20 minutes, 2-3 times a day.  Keep your affected joint raised (elevated) above the level of your heart, when possible. This will help to reduce swelling. For example, prop your foot up on a chair while sitting down to elevate your knee. General instructions  If the painful joint is in your leg, use crutches as told by your health care provider.  Take over-the-counter and prescription medicines only as told by your health care provider.  When your symptoms start to go away, begin to exercise regularly or  do physical therapy. Talk with your health care provider or physical therapist about what types of exercise are safe for you. Exercise that is easier on your joints (low-impact exercise) may be best. This includes walking, swimming, bicycling, and water aerobics.  Maintain a healthy weight. Excess weight puts stress on your joints.  Keep all follow-up visits as told by your health care provider and physical therapist. This is important. Contact a  health care provider if you:  Notice that your symptoms get worse.  Develop a skin rash.  Notice that your pain gets worse. Get help right away if you:  Have a fever.  Have difficulty breathing.  Are taking NSAIDs and you: ? Vomit blood. ? Have blood in your stool. ? Have stool that is tarry and black. Summary  Calcium pyrophosphate deposition (CPPD) is a type of arthritis that causes pain, swelling, and inflammation in a joint. The knees are most often affected, but it can also affect the wrists, elbows, shoulders, or ankles.  CPPD is caused by the buildup of calcium crystals in a joint. The reason why this occurs is not known.  Attacks of CPPD may come and go. The joint pain can be severe and may last for days to weeks.  There is no way to remove the crystals from the joint and no cure for this condition. However, treatment can relieve symptoms and improve joint function.  Rest the affected joint until your symptoms start to go away. After your symptoms go away, begin to exercise regularly or do physical therapy. This information is not intended to replace advice given to you by your health care provider. Make sure you discuss any questions you have with your health care provider. Document Revised: 02/05/2017 Document Reviewed: 12/22/2016 Elsevier Patient Education  [place].

## 2019-03-26 ENCOUNTER — Other Ambulatory Visit: Payer: Self-pay | Admitting: Internal Medicine

## 2019-03-27 ENCOUNTER — Telehealth: Payer: Self-pay | Admitting: Family Medicine

## 2019-03-27 MED ORDER — COLCHICINE 0.6 MG PO TABS: 0.6000 mg | ORAL_TABLET | Freq: Every day | ORAL | 1 refills | Status: DC | PRN

## 2019-03-27 NOTE — Telephone Encounter (Signed)
Changed to 90 days supply per request from [facility] pharmacy.

## 2019-05-18 NOTE — Therapy (Signed)
[place], [place], [address] Phone: [phone]   Fax:  [phone]  Physical Therapy Evaluation/Discharge  Patient Details  Name: Colin Love MRN: [id number] Date of Birth: 01-17-1946 Referring Provider (PT): Lynne Leader, MD   Encounter Date: 03/16/2019    Past Medical History:  Diagnosis Date  . Adenoma of left adrenal gland   . At risk for sleep apnea    STOP-BANG= 4    SENT TO PCP 02-06-2014  . Bladder cancer ([place]) 02/2014   local, low grade pap uro carcinoma  . BPH (benign prostatic hypertrophy)   . Diverticulosis of colon   . Full dentures   . GERD (gastroesophageal reflux disease)   . History of adenomatous polyp of colon   . History of diverticulitis of colon   . Hyperlipidemia   . Hypertension   . Murmur, cardiac   . PAC (premature atrial contraction)   . Type 2 diabetes mellitus ([place])     Past Surgical History:  Procedure Laterality Date  . ANTERIOR CERVICAL DECOMP/DISCECTOMY FUSION  05-24-2003   C4  --  C7  . COLONOSCOPY W/ POLYPECTOMY  last one 07-24-2013 w/ EGD  . CYSTOSCOPY W/ RETROGRADES Bilateral 02/09/2014   Procedure: BILATERAL RETROGRADE PYELOGRAM , Cystoscopy;  Surgeon: Ardis Hughs, MD;  Location: [facility];  Service: Urology;  Laterality: Bilateral;  . TRANSURETHRAL RESECTION OF BLADDER TUMOR WITH GYRUS (TURBT-GYRUS) N/A 02/09/2014   Procedure: Bladder Biopsies;  Surgeon: Ardis Hughs, MD;  Location: [facility];  Service: Urology;  Laterality: N/A;    There were no vitals filed for this visit.                  Objective measurements completed on examination: See above findings.                   PT Long Term Goals - 03/16/19 1255      PT LONG TERM GOAL #1   Title  Independent with HEP    Baseline  instructed today    Time  6    Period  Weeks    Status  New    Target Date  04/27/19       PT LONG TERM GOAL #2   Title  Increase left knee flexion AROM at least 5-10 deg to improve ability to navigate stairs and perform transfers from low seats    Baseline  112 deg    Time  6    Period  Weeks    Status  New    Target Date  04/27/19      PT LONG TERM GOAL #3   Title  Increase bilat. hip extension strength 1/2 MMT grade to improve ability for transfers from low seats    Baseline  4+/5    Time  6    Period  Weeks    Status  New    Target Date  04/27/19      PT LONG TERM GOAL #4   Title  Tolerate yardwork and ambulation for home and community mobility periods at least 20-30 min for shopping with back and knee pain decreased at least 50% from baseline    Time  6    Period  Weeks    Status  New    Target Date  04/27/19      PT LONG TERM GOAL #5   Title  Improve FOTO outcome measure score to 33% or  less impairment    Baseline  38% limited    Time  6    Period  Weeks    Status  New    Target Date  04/27/19               Patient will benefit from skilled therapeutic intervention in order to improve the following deficits and impairments:  Pain, Impaired flexibility, Decreased strength, Decreased activity tolerance, Decreased range of motion, Increased edema, Impaired sensation, Difficulty walking  Visit Diagnosis: Radiculopathy, lumbar region - Plan: PT plan of care cert/re-cert  Acute pain of left knee - Plan: PT plan of care cert/re-cert     Problem List Patient Active Problem List   Diagnosis Date Noted  . Chondrocalcinosis of left knee 03/21/2019  . Sciatica of right side 08/04/2018  . Abnormal echocardiogram 05/26/2016  . Burning sensation of feet 05/26/2016  . Sudden right hearing loss 08/07/2015  . Irregular heart rhythm 08/07/2015  . Glaucoma 05/21/2015  . PAC (premature atrial contraction) 01/24/2014  . Bladder cancer ([place]) 01/09/2014  . Tobacco abuse   . Diabetes type 2, controlled ([place]) 12/23/2009  . Dyslipidemia 12/23/2009  .  Essential hypertension 12/23/2009  . COLONIC POLYPS, HX OF 12/23/2009  . DIVERTICULITIS, HX OF 12/23/2009       PHYSICAL THERAPY DISCHARGE SUMMARY  Visits from Start of Care: 1  Current functional level related to goals / functional outcomes: Patient did not return for further therapy after initial eval visit 03/16/19   Remaining deficits: Status unknown   Education / Equipment: NA Plan: Patient agrees to discharge.  Patient goals were not met. Patient is being discharged due to not returning since the last visit.  ?????          Beaulah Dinning, PT, DPT 05/18/19 2:32 PM     [facility] [address] [place], [place], [address] Phone: [phone]   Fax:  [phone]  Name: Colin Love MRN: [id number] Date of Birth: [birth year]

## 2019-05-22 ENCOUNTER — Ambulatory Visit: Payer: Medicare HMO | Admitting: Family Medicine

## 2019-05-22 ENCOUNTER — Encounter: Payer: Self-pay | Admitting: Family Medicine

## 2019-05-22 ENCOUNTER — Other Ambulatory Visit: Payer: Self-pay

## 2019-05-22 ENCOUNTER — Ambulatory Visit (INDEPENDENT_AMBULATORY_CARE_PROVIDER_SITE_OTHER): Payer: Medicare HMO

## 2019-05-22 VITALS — BP 126/82 | HR 52 | Ht 67.0 in | Wt 194.0 lb

## 2019-05-22 DIAGNOSIS — M545 Low back pain: Secondary | ICD-10-CM | POA: Diagnosis not present

## 2019-05-22 DIAGNOSIS — M11262 Other chondrocalcinosis, left knee: Secondary | ICD-10-CM

## 2019-05-22 DIAGNOSIS — M5441 Lumbago with sciatica, right side: Secondary | ICD-10-CM | POA: Diagnosis not present

## 2019-05-22 DIAGNOSIS — M5442 Lumbago with sciatica, left side: Secondary | ICD-10-CM

## 2019-05-22 NOTE — Progress Notes (Signed)
I, Wendy Poet, LAT, ATC, am serving as scribe for Dr. Lynne Leader.  Colin Love is a 74 y.o. male who presents to [place] [facility] today for f/u of low back pain w/ radiating pain into his R LE and L knee pain.  He was last seen by Dr. Georgina Snell on 03/21/19 and noted improvement in both areas.  He has completed one PT visits and has been doing his HEP daily.  He had a L knee injection on 03/21/19 and was prescribed cholchicine.  Since his last visit, pt reports that his knee pain is better. Back pain has increased since last visit. Was placing an item overhead in his garage. Has had radiating symptoms down both legs constantly throughout his day.  Pain radiates down the posterior aspect of his legs.  He notes overall he thinks he is improved a bit over the last few days.  Patient was seen previously for knee pain.  As noted above pain is improved.  He was thought to have chondrocalcinosis but was never able to get the colchicine due to drug interaction.  He never did take colchicine but is feeling better regardless.  Diagnostic imaging: L knee XR- 02/21/19; L-spine XR- 08/04/18  Pertinent review of systems: No fevers or chills  Relevant historical information: Diabetes   Exam:  BP 126/82   Pulse (!) 52   Ht 5\' 7"  (1.702 m)   Wt 194 lb (88 kg)   SpO2 99%   BMI 30.38 kg/m  General: Well Developed, well nourished, and in no acute distress.   MSK: L-spine: Nontender to midline.  Decreased lumbar motion especially into flexion. Lower extremity strength reflexes and sensation are intact distally. Mild antalgic gait.    Lab and Radiology Results  X-ray images L-spine obtained today personally and independently reviewed. Significant DDD and DJD throughout lumbar spine worse at L5-S1.  Possible compression fracture at L5-S1 however could possibly be due to overlap imaging. Await formal radiology review    Assessment and Plan: 74 y.o. male with new low back  pain radiating down bilateral legs S1 dermatome. Pain worsened after he tried to lift a heavy object.  He notes however he is feeling a bit better than he was the last couple of days.  Plan for x-ray as above and await formal radiology over read.  Discussed options.  Plan to return to physical therapy and if not improving in the next week or 2 would proceed to MRI.  Radiology is suspicious for compression fracture likely will proceed to MRI sooner than otherwise would.  Discussed plan with patient expresses understanding and agreement.  Knee pain much better following steroid injection.  No colchicine for now.  PDMP not reviewed this encounter. Orders Placed This Encounter  Procedures  . DG Lumbar Spine 2-3 Views    Standing Status:   Future    Number of Occurrences:   1    Standing Expiration Date:   07/21/2020    Order Specific Question:   Reason for Exam (SYMPTOM  OR DIAGNOSIS REQUIRED)    Answer:   eval low back pain and S1 radicular pain    Order Specific Question:   Preferred imaging location?    Answer:   [facility]    Order Specific Question:   Radiology Contrast Protocol - do NOT remove file path    Answer:   [email or web address]   No orders of the defined types were placed in this encounter.  Discussed warning signs or symptoms. Please see discharge instructions. Patient expresses understanding.   The above documentation has been reviewed and is accurate and complete Lynne Leader

## 2019-05-22 NOTE — Patient Instructions (Addendum)
Thank you for coming in today. Plan to attend PT again.  Get xray today.  Let me know in about 2 weeks how you are doing.  If not doing well I will order the MRI.  Call PT again to get a few appointments.  Continue gabapentin as needed.   PT phone number [phone]

## 2019-05-22 NOTE — Progress Notes (Signed)
X-rays shows arthritis but no fractures.  If not improving will proceed with MRI.

## 2019-07-20 DIAGNOSIS — E1165 Type 2 diabetes mellitus with hyperglycemia: Secondary | ICD-10-CM | POA: Diagnosis not present

## 2019-07-20 DIAGNOSIS — Z720 Tobacco use: Secondary | ICD-10-CM | POA: Diagnosis not present

## 2019-07-20 DIAGNOSIS — H409 Unspecified glaucoma: Secondary | ICD-10-CM | POA: Diagnosis not present

## 2019-07-20 DIAGNOSIS — Z7982 Long term (current) use of aspirin: Secondary | ICD-10-CM | POA: Diagnosis not present

## 2019-07-20 DIAGNOSIS — E785 Hyperlipidemia, unspecified: Secondary | ICD-10-CM | POA: Diagnosis not present

## 2019-07-20 DIAGNOSIS — I10 Essential (primary) hypertension: Secondary | ICD-10-CM | POA: Diagnosis not present

## 2019-07-20 DIAGNOSIS — M1990 Unspecified osteoarthritis, unspecified site: Secondary | ICD-10-CM | POA: Diagnosis not present

## 2019-07-20 DIAGNOSIS — E1142 Type 2 diabetes mellitus with diabetic polyneuropathy: Secondary | ICD-10-CM | POA: Diagnosis not present

## 2019-07-20 DIAGNOSIS — Z7984 Long term (current) use of oral hypoglycemic drugs: Secondary | ICD-10-CM | POA: Diagnosis not present

## 2019-07-20 DIAGNOSIS — G8929 Other chronic pain: Secondary | ICD-10-CM | POA: Diagnosis not present

## 2019-07-21 DIAGNOSIS — H25011 Cortical age-related cataract, right eye: Secondary | ICD-10-CM | POA: Diagnosis not present

## 2019-07-21 DIAGNOSIS — N3000 Acute cystitis without hematuria: Secondary | ICD-10-CM | POA: Diagnosis not present

## 2019-07-21 DIAGNOSIS — H2513 Age-related nuclear cataract, bilateral: Secondary | ICD-10-CM | POA: Diagnosis not present

## 2019-07-21 DIAGNOSIS — E119 Type 2 diabetes mellitus without complications: Secondary | ICD-10-CM | POA: Diagnosis not present

## 2019-07-21 DIAGNOSIS — H401231 Low-tension glaucoma, bilateral, mild stage: Secondary | ICD-10-CM | POA: Diagnosis not present

## 2019-08-03 ENCOUNTER — Other Ambulatory Visit: Payer: Self-pay | Admitting: Internal Medicine

## 2019-08-06 NOTE — Patient Instructions (Addendum)
  Blood work was ordered.     Medications reviewed and updated.  Changes include :   none  Your prescription(s) have been submitted to your pharmacy. Please take as directed and contact our office if you believe you are having problem(s) with the medication(s).   Please followup in 6 months   

## 2019-08-06 NOTE — Progress Notes (Signed)
Subjective:    Patient ID: Colin Love, male    DOB: November 10, 1945, 74 y.o.   MRN: [id number]  HPI The patient is here for follow up of their chronic medical problems, including diabetes, hypertension, hyperlipidemia, burning sensation in feet   He had a UTI about three weeks ago and saw urology.  He follows up with urology this week.  His symptoms are better, but not completely normal.    He is still smoking.    Medications and allergies reviewed with patient and updated if appropriate.  Patient Active Problem List   Diagnosis Date Noted  . Chondrocalcinosis of left knee 03/21/2019  . Sciatica of right side 08/04/2018  . Abnormal echocardiogram 05/26/2016  . Burning sensation of feet 05/26/2016  . Sudden right hearing loss 08/07/2015  . Irregular heart rhythm 08/07/2015  . Glaucoma 05/21/2015  . PAC (premature atrial contraction) 01/24/2014  . Bladder cancer ([place]) 01/09/2014  . Tobacco abuse   . Diabetes type 2, controlled ([place]) 12/23/2009  . Dyslipidemia 12/23/2009  . Essential hypertension 12/23/2009  . COLONIC POLYPS, HX OF 12/23/2009  . DIVERTICULITIS, HX OF 12/23/2009    Current Outpatient Medications on File Prior to Visit  Medication Sig Dispense Refill  . ACCU-CHEK SOFTCLIX LANCETS lancets TEST UP TO 4 TIMES A DAY 300 each 1  . aspirin 81 MG tablet Take 81 mg by mouth daily.      Marland Kitchen atorvastatin (LIPITOR) 20 MG tablet TAKE 1 TABLET BY MOUTH ONCE DAILY AT 6PM 90 tablet 1  . blood glucose meter kit and supplies KIT Dispense based on patient and insurance preference. Use up to four times daily as directed. (FOR ICD-9 250.00, 250.01). 1 each 0  . diltiazem (CARDIZEM CD) 180 MG 24 hr capsule TAKE 1 CAPSULE BY MOUTH 2 TIMES A DAY 180 capsule 1  . gabapentin (NEURONTIN) 100 MG capsule Take 1-2 capsules (100-200 mg total) by mouth daily as needed. 90 capsule 3  . latanoprost (XALATAN) 0.005 % ophthalmic solution Place 1 drop into both eyes at bedtime.     Marland Kitchen  lisinopril-hydrochlorothiazide (ZESTORETIC) 20-12.5 MG tablet TAKE 1 TABLET BY MOUTH 2 TIMES A DAY 180 tablet 1  . ONE TOUCH ULTRA TEST test strip TEST UP TO 4 TIMES DAILY 100 each 1  . potassium chloride (KLOR-CON) 10 MEQ tablet TAKE 3 TABLETS (30 MEQ TOTAL) BY MOUTH DAILY. 270 tablet 1  . terazosin (HYTRIN) 10 MG capsule TAKE 1 CAPSULE BY MOUTH EVERYDAY AT BEDTIME 90 capsule 1   No current facility-administered medications on file prior to visit.    Past Medical History:  Diagnosis Date  . Adenoma of left adrenal gland   . At risk for sleep apnea    STOP-BANG= 4    SENT TO PCP 02-06-2014  . Bladder cancer ([place]) 02/2014   local, low grade pap uro carcinoma  . BPH (benign prostatic hypertrophy)   . Diverticulosis of colon   . Full dentures   . GERD (gastroesophageal reflux disease)   . History of adenomatous polyp of colon   . History of diverticulitis of colon   . Hyperlipidemia   . Hypertension   . Murmur, cardiac   . PAC (premature atrial contraction)   . Type 2 diabetes mellitus ([place])     Past Surgical History:  Procedure Laterality Date  . ANTERIOR CERVICAL DECOMP/DISCECTOMY FUSION  05-24-2003   C4  --  C7  . COLONOSCOPY W/ POLYPECTOMY  last one 07-24-2013 w/ EGD  .  CYSTOSCOPY W/ RETROGRADES Bilateral 02/09/2014   Procedure: BILATERAL RETROGRADE PYELOGRAM , Cystoscopy;  Surgeon: Ardis Hughs, MD;  Location: [facility];  Service: Urology;  Laterality: Bilateral;  . TRANSURETHRAL RESECTION OF BLADDER TUMOR WITH GYRUS (TURBT-GYRUS) N/A 02/09/2014   Procedure: Bladder Biopsies;  Surgeon: Ardis Hughs, MD;  Location: [facility];  Service: Urology;  Laterality: N/A;    Social History   Socioeconomic History  . Marital status: Married    Spouse name: Not on file  . Number of children: Not on file  . Years of education: Not on file  . Highest education level: Not on file  Occupational History  . Not on file  Tobacco Use  .  Smoking status: Current Every Day Smoker    Packs/day: 0.50    Years: 40.00    Pack years: 20.00    Types: Cigarettes  . Smokeless tobacco: Never Used  Substance and Sexual Activity  . Alcohol use: Yes    Comment: OCCASIONAL  . Drug use: No  . Sexual activity: Not Currently  Other Topics Concern  . Not on file  Social History Narrative  . Not on file   Social Determinants of Health   Financial Resource Strain:   . Difficulty of Paying Living Expenses:   Food Insecurity:   . Worried About Charity fundraiser in the Last Year:   . Arboriculturist in the Last Year:   Transportation Needs:   . Film/video editor (Medical):   Marland Kitchen Lack of Transportation (Non-Medical):   Physical Activity:   . Days of Exercise per Week:   . Minutes of Exercise per Session:   Stress:   . Feeling of Stress :   Social Connections:   . Frequency of Communication with Friends and Family:   . Frequency of Social Gatherings with Friends and Family:   . Attends Religious Services:   . Active Member of Clubs or Organizations:   . Attends Archivist Meetings:   Marland Kitchen Marital Status:     Family History  Problem Relation Age of Onset  . Colon cancer Mother   . Hypertension Mother   . Hyperlipidemia Mother   . Hypertension Father   . Hyperlipidemia Father   . Diabetes Brother     Review of Systems  Constitutional: Negative for chills and fever.  Respiratory: Negative for cough, shortness of breath and wheezing.   Cardiovascular: Negative for chest pain, palpitations and leg swelling.  Neurological: Negative for light-headedness and headaches.       Objective:   Vitals:   08/08/19 0955  BP: 124/76  Pulse: 90  Resp: 16  Temp: 98.2 F (36.8 C)  SpO2: 98%   BP Readings from Last 3 Encounters:  08/08/19 124/76  05/22/19 126/82  03/21/19 (!) 142/82   Wt Readings from Last 3 Encounters:  08/08/19 186 lb 6.4 oz (84.6 kg)  05/22/19 194 lb (88 kg)  03/21/19 193 lb (87.5 kg)    Body mass index is 29.19 kg/m.   Physical Exam    Constitutional: Appears well-developed and well-nourished. No distress.  HENT:  Head: Normocephalic and atraumatic.  Neck: Neck supple. No tracheal deviation present. No thyromegaly present.  No cervical lymphadenopathy Cardiovascular: Normal rate, regular rhythm and normal heart sounds.   No murmur heard. No carotid bruit .  No edema Pulmonary/Chest: Effort normal and breath sounds normal. No respiratory distress. No has no wheezes. No rales.  Skin: Skin is warm  and dry. Not diaphoretic.  Psychiatric: Normal mood and affect. Behavior is normal.      Assessment & Plan:    See Problem List for Assessment and Plan of chronic medical problems.    This visit occurred during the SARS-CoV-2 public health emergency.  Safety protocols were in place, including screening questions prior to the visit, additional usage of staff PPE, and extensive cleaning of exam room while observing appropriate contact time as indicated for disinfecting solutions.

## 2019-08-08 ENCOUNTER — Other Ambulatory Visit: Payer: Self-pay

## 2019-08-08 ENCOUNTER — Encounter: Payer: Self-pay | Admitting: Internal Medicine

## 2019-08-08 ENCOUNTER — Ambulatory Visit (INDEPENDENT_AMBULATORY_CARE_PROVIDER_SITE_OTHER): Payer: Medicare HMO | Admitting: Internal Medicine

## 2019-08-08 ENCOUNTER — Other Ambulatory Visit: Payer: Self-pay | Admitting: Internal Medicine

## 2019-08-08 VITALS — BP 124/76 | HR 90 | Temp 98.2°F | Resp 16 | Ht 67.0 in | Wt 186.4 lb

## 2019-08-08 DIAGNOSIS — I10 Essential (primary) hypertension: Secondary | ICD-10-CM

## 2019-08-08 DIAGNOSIS — Z125 Encounter for screening for malignant neoplasm of prostate: Secondary | ICD-10-CM | POA: Diagnosis not present

## 2019-08-08 DIAGNOSIS — Z720 Tobacco use: Secondary | ICD-10-CM | POA: Diagnosis not present

## 2019-08-08 DIAGNOSIS — E119 Type 2 diabetes mellitus without complications: Secondary | ICD-10-CM

## 2019-08-08 DIAGNOSIS — E785 Hyperlipidemia, unspecified: Secondary | ICD-10-CM

## 2019-08-08 DIAGNOSIS — R208 Other disturbances of skin sensation: Secondary | ICD-10-CM | POA: Diagnosis not present

## 2019-08-08 LAB — COMPREHENSIVE METABOLIC PANEL WITH GFR
ALT: 13 U/L (ref 0–53)
AST: 13 U/L (ref 0–37)
Albumin: 4.3 g/dL (ref 3.5–5.2)
Alkaline Phosphatase: 61 U/L (ref 39–117)
BUN: 18 mg/dL (ref 6–23)
CO2: 28 meq/L (ref 19–32)
Calcium: 10.6 mg/dL — ABNORMAL HIGH (ref 8.4–10.5)
Chloride: 103 meq/L (ref 96–112)
Creatinine, Ser: 0.87 mg/dL (ref 0.40–1.50)
GFR: 103.61 mL/min
Glucose, Bld: 122 mg/dL — ABNORMAL HIGH (ref 70–99)
Potassium: 4 meq/L (ref 3.5–5.1)
Sodium: 140 meq/L (ref 135–145)
Total Bilirubin: 0.7 mg/dL (ref 0.2–1.2)
Total Protein: 6.8 g/dL (ref 6.0–8.3)

## 2019-08-08 LAB — LIPID PANEL
Cholesterol: 113 mg/dL (ref 0–200)
HDL: 44.1 mg/dL
LDL Cholesterol: 55 mg/dL (ref 0–99)
NonHDL: 68.5
Total CHOL/HDL Ratio: 3
Triglycerides: 69 mg/dL (ref 0.0–149.0)
VLDL: 13.8 mg/dL (ref 0.0–40.0)

## 2019-08-08 LAB — PSA, MEDICARE: PSA: 3.21 ng/mL (ref 0.10–4.00)

## 2019-08-08 LAB — HEMOGLOBIN A1C: Hgb A1c MFr Bld: 6.7 % — ABNORMAL HIGH (ref 4.6–6.5)

## 2019-08-08 NOTE — Assessment & Plan Note (Signed)
chronic Taking gabapentin as needed - it does help, but does not need it daily continue

## 2019-08-08 NOTE — Assessment & Plan Note (Signed)
Chronic Check lipid panel  Continue daily statin Regular exercise and healthy diet encouraged  

## 2019-08-08 NOTE — Assessment & Plan Note (Signed)
Chronic Taking metformin Check a1c Low sugar / carb diet Stressed regular exercise

## 2019-08-08 NOTE — Assessment & Plan Note (Addendum)
Chronic Encouraged smoking cessation He wants to quits and knows he needs to quit He just can't seem to quit

## 2019-08-08 NOTE — Assessment & Plan Note (Signed)
Chronic BP well controlled Current regimen effective and well tolerated Continue current medications at current doses cmp  

## 2019-08-10 DIAGNOSIS — N3000 Acute cystitis without hematuria: Secondary | ICD-10-CM | POA: Diagnosis not present

## 2019-08-10 DIAGNOSIS — C672 Malignant neoplasm of lateral wall of bladder: Secondary | ICD-10-CM | POA: Diagnosis not present

## 2019-08-19 ENCOUNTER — Other Ambulatory Visit: Payer: Self-pay | Admitting: Internal Medicine

## 2019-09-12 DIAGNOSIS — C672 Malignant neoplasm of lateral wall of bladder: Secondary | ICD-10-CM | POA: Diagnosis not present

## 2019-09-12 DIAGNOSIS — N5201 Erectile dysfunction due to arterial insufficiency: Secondary | ICD-10-CM | POA: Diagnosis not present

## 2019-09-25 ENCOUNTER — Other Ambulatory Visit: Payer: Self-pay | Admitting: Internal Medicine

## 2019-11-30 ENCOUNTER — Other Ambulatory Visit: Payer: Self-pay

## 2019-11-30 ENCOUNTER — Ambulatory Visit: Payer: Medicare HMO | Admitting: Family Medicine

## 2019-11-30 ENCOUNTER — Encounter: Payer: Self-pay | Admitting: Family Medicine

## 2019-11-30 VITALS — BP 130/84 | HR 83 | Ht 67.0 in | Wt 190.0 lb

## 2019-11-30 DIAGNOSIS — M5441 Lumbago with sciatica, right side: Secondary | ICD-10-CM | POA: Diagnosis not present

## 2019-11-30 DIAGNOSIS — G8929 Other chronic pain: Secondary | ICD-10-CM | POA: Diagnosis not present

## 2019-11-30 DIAGNOSIS — M5442 Lumbago with sciatica, left side: Secondary | ICD-10-CM

## 2019-11-30 DIAGNOSIS — R29898 Other symptoms and signs involving the musculoskeletal system: Secondary | ICD-10-CM | POA: Diagnosis not present

## 2019-11-30 NOTE — Patient Instructions (Signed)
Thank you for coming in today. Plan for MRI.  Recheck after MRI of your spine.  Let me know sooner if you have problem.   You should hear from MRI scheduling within 1 week. If you do not hear please let me know.

## 2019-11-30 NOTE — Progress Notes (Signed)
Rito Ehrlich, am serving as a Education administrator for Dr. Lynne Leader.  Colin Love is a 74 y.o. male who presents to [place] at [facility] today for knee pain and numbness.  He was last seen by Dr. Georgina Snell on 05/22/19 for LBP and B LE pain and was referred for PT.  Patient completed PT and continues PT at home,  Since his last visit, pt reports states he was getting better but the pain in his lower back that is like a soreness came back. Patient states that the pain radiates down his legs, but his knees are now getting weak like they are going to give out and stiff. Patient has noticed some swelling in his L Medial knee.   His major issue is that his legs feel weak and he has some pain radiating down the posterior thighs to the posterior knees.  He does not complain much of knee pain or swelling.  Pertinent review of systems: No fevers or chills  Relevant historical information: No fevers or chills   Exam:  BP 130/84 (BP Location: Left Arm, Patient Position: Sitting, Cuff Size: Large)   Pulse 83   Ht 5\' 7"  (1.702 m)   Wt 190 lb (86.2 kg)   SpO2 99%   BMI 29.76 kg/m  General: Well Developed, well nourished, and in no acute distress.   MSK: L-spine normal-appearing nontender midline decreased lumbar motion to extension. Lower extremity strength reduced hip abduction bilaterally otherwise intact to resisted testing in clinic. Reflexes and sensation are intact distally.  Hips bilaterally normal-appearing normal motion not particular tender greater trochanter bilaterally.  Reduced hip abduction strength 4/5 bilaterally.  Knees bilaterally normal-appearing Nontender.  Normal motion.  Crepitation with extension.    Lab and Radiology Results  X-ray images lumbar spine obtained May 22, 2019 personally independently reviewed and interpreted today Diffuse DDD dominant issue.  No fractures.  See attached x-ray report below. EXAM: LUMBAR SPINE - 2-3  VIEW  COMPARISON:  08/04/2018  FINDINGS: Slight wedged appearance of the L2 and T12 vertebral bodies, similar to prior study. No acute fracture or malalignment. Diffuse degenerative disc disease and degenerative facet disease, most pronounced in the lower lumbar spine. SI joints symmetric and unremarkable. Aortic and iliac calcifications. No aneurysm.  IMPRESSION: Slight wedged appearance of the T12 and L2 vertebral bodies, similar to prior study. No acute bony abnormality.  Diffuse degenerative changes.   Electronically Signed   By: Rolm Baptise M.D.   On: 05/22/2019 10:43   Assessment and Plan: 74 y.o. male with leg weakness.  Major issue today is stiffness leg weakness and bilateral buttocks/posterior thigh pain.  He notes this has been an ongoing problem for almost a year now.  He had evaluation for this earlier this year with an x-ray and a good trial of physical therapy.  This helped somewhat but his symptoms persisted and have been slowly worsening.  Plan for MRI to further characterize cause of weakness and pain and for potential epidural steroid injection planning.  Recheck following MRI.  Chronic issue with exacerbation.  Patient does have degenerative joint disease and chondrocalcinosis on knee x-rays seen previously.  This probably is a factor as well but he does not have much knee pain or swelling today.   PDMP not reviewed this encounter. Orders Placed This Encounter  Procedures  . MR Lumbar Spine Wo Contrast    Standing Status:   Future    Standing Expiration Date:   11/29/2020  Order Specific Question:   What is the patient's sedation requirement?    Answer:   No Sedation    Order Specific Question:   Does the patient have a pacemaker or implanted devices?    Answer:   No    Order Specific Question:   Preferred imaging location?    Answer:   [facility] (table limit-550lbs)    Order Specific Question:   Radiology Contrast Protocol - do NOT remove  file path    Answer:   [email or web address]   No orders of the defined types were placed in this encounter.    Discussed warning signs or symptoms. Please see discharge instructions. Patient expresses understanding.   The above documentation has been reviewed and is accurate and complete Lynne Leader, M.D.

## 2019-12-21 ENCOUNTER — Ambulatory Visit
Admission: RE | Admit: 2019-12-21 | Discharge: 2019-12-21 | Disposition: A | Discharge status: Home / Self Care | Payer: Medicare HMO | Source: Ambulatory Visit | Attending: Family Medicine | Admitting: Family Medicine

## 2019-12-21 ENCOUNTER — Other Ambulatory Visit: Payer: Self-pay

## 2019-12-21 DIAGNOSIS — M48061 Spinal stenosis, lumbar region without neurogenic claudication: Secondary | ICD-10-CM | POA: Diagnosis not present

## 2019-12-21 DIAGNOSIS — M5442 Lumbago with sciatica, left side: Secondary | ICD-10-CM

## 2019-12-21 DIAGNOSIS — M545 Low back pain, unspecified: Secondary | ICD-10-CM | POA: Diagnosis not present

## 2019-12-21 DIAGNOSIS — M5441 Lumbago with sciatica, right side: Secondary | ICD-10-CM

## 2019-12-21 DIAGNOSIS — R29898 Other symptoms and signs involving the musculoskeletal system: Secondary | ICD-10-CM

## 2019-12-22 NOTE — Progress Notes (Signed)
MRI lumbar spine shows multiple areas where nerves could be pinched causing some of the pain weakness and stiffness you are experiencing.  Recheck in clinic with me to review the MRI results and discuss potential treatments including epidural steroid injections or facet injections.

## 2019-12-28 ENCOUNTER — Other Ambulatory Visit: Payer: Self-pay

## 2019-12-28 ENCOUNTER — Encounter: Payer: Self-pay | Admitting: Family Medicine

## 2019-12-28 ENCOUNTER — Ambulatory Visit: Payer: Medicare HMO | Admitting: Family Medicine

## 2019-12-28 VITALS — BP 122/80 | HR 88 | Ht 67.0 in | Wt 190.0 lb

## 2019-12-28 DIAGNOSIS — M5441 Lumbago with sciatica, right side: Secondary | ICD-10-CM | POA: Diagnosis not present

## 2019-12-28 DIAGNOSIS — M11262 Other chondrocalcinosis, left knee: Secondary | ICD-10-CM

## 2019-12-28 DIAGNOSIS — M5442 Lumbago with sciatica, left side: Secondary | ICD-10-CM

## 2019-12-28 DIAGNOSIS — R29898 Other symptoms and signs involving the musculoskeletal system: Secondary | ICD-10-CM

## 2019-12-28 NOTE — Progress Notes (Signed)
Rito Ehrlich, am serving as a Education administrator for Dr. Lynne Leader.  Colin Love is a 74 y.o. male who presents to [place] at [facility] today for f/u of LBP and B LE pain and weakness.  He was last seen by Dr. Georgina Snell on 11/30/19 and noted con't symptoms despite conservative treatment w/ PT.  He was referred for a lumbar spine MRI that he had on 12/21/19.  Since his last visit, pt reports patient states he is still in pain about the same. When first getting up he needs to move his legs around before he can actually stand and walk.Laying in the bed at night the pain in L lateral side of leg has shooting pain. Is still taking the gabapentin does not seem to be helping.   Diagnostic testing: L-spine MRI- 12/21/19; L-spine XR- 05/22/19   Pertinent review of systems: No fevers or chills  Relevant historical information: Hypertension, diabetes, bladder cancer history, chondrocalcinosis history left knee   Exam:  BP 122/80 (BP Location: Left Arm, Patient Position: Sitting, Cuff Size: Large)   Pulse 88   Ht 5\' 7"  (1.702 m)   Wt 190 lb (86.2 kg)   SpO2 98%   BMI 29.76 kg/m  General: Well Developed, well nourished, and in no acute distress.   MSK: Left leg strength intact hip flexion knee extension knee flexion foot dorsiflexion plantarflexion. Left knee mild effusion present.     Lab and Radiology Results MR Lumbar Spine Wo Contrast  Result Date: 12/21/2019 CLINICAL DATA:  Low back pain for greater than 6 weeks. Radiating down legs. EXAM: MRI LUMBAR SPINE WITHOUT CONTRAST TECHNIQUE: Multiplanar, multisequence MR imaging of the lumbar spine was performed. No intravenous contrast was administered. COMPARISON:  Lumbar radiographs from Aug 04, 2018. FINDINGS: Segmentation:  5 non rib-bearing lumbar vertebral bodies. Alignment: Trace stepwise retrolisthesis L2 on L3 and L3 on L4. Otherwise, physiologic. Vertebrae: Scattered benign vertebral venous malformations in  degenerative endplate signal changes about the L5-S1 disc. Otherwise, no focal marrow signal abnormality to suggest acute fracture, discitis/osteomyelitis, or suspicious bone marrow lesion. Conus medullaris and cauda equina: Conus extends to the L1 level. Conus appears normal. Paraspinal and other soft tissues: T2 hyperintense lesions involving bilateral kidneys, suboptimally evaluated but statistically most likely cysts. Disc levels: T12-L1: No significant disc protrusion, foraminal stenosis, or canal stenosis. L1-L2: Small left eccentric broad-based disc bulge with mild left foraminal stenosis. No significant right foraminal stenosis or canal stenosis. L2-L3: Broad-based disc bulge with small right paracentral disc protrusion. Mild canal stenosis and moderate bilateral subarticular recess stenosis. No significant foraminal stenosis. L3-L4: Broad-based disc bulge and bilateral facet hypertrophy. Resulting moderate canal stenosis and mild bilateral foraminal stenosis. Severe left and moderate right subarticular recess stenosis. L4-L5: Broad-based disc bulge, bilateral facet hypertrophy and ligamentum flavum thickening. Resulting mild canal stenosis and severe left and moderate right subarticular recess stenosis. Mild bilateral foraminal stenosis. L5-S1: Broad-based disc bulge with superimposed left subarticular/foraminal disc/osteophyte. Bilateral facet hypertrophy. Resulting moderate bilateral foraminal stenosis. Severe left subarticular recess stenosis. IMPRESSION: 1. Moderate canal stenosis at L3-L4 and mild canal stenosis at L2-L3 and L4-L5. 2. Moderate bilateral foraminal stenosis at L5-S1 where there is a left subarticular/foraminal disc/osteophyte. 3. Multilevel subarticular recess stenosis, severe on the left at L3-L4, L4-L5 and L5-S1. Electronically Signed   By: Margaretha Sheffield MD   On: 12/21/2019 12:21   I, Lynne Leader, personally (independently) visualized and performed the interpretation of the images  attached in this note. Multilevel  spinal stenosis L3-4 worse level.     Assessment and Plan: 74 y.o. male with leg weakness multifactorial.  Patient has spinal stenosis pattern throughout lumbar spine.  His biggest complaint is pain and weakness into his anterior thighs and lateral hips.  This does correspond to L3 dermatomal pattern.  This is his worst level of spinal stenosis seen on MRI.  Plan for trial epidural steroid injection and recheck back in a few weeks. Additionally may consider his left knee in the future as well.  He has DJD and chondrocalcinosis changes.  He may be amenable to steroid injection or even colchicine.  PDMP not reviewed this encounter. Orders Placed This Encounter  Procedures  . DG INJECT DIAG/THERA/INC NEEDLE/CATH/PLC EPI/LUMB/SAC W/IMG    Standing Status:   Future    Standing Expiration Date:   12/27/2020    Order Specific Question:   Reason for Exam (SYMPTOM  OR DIAGNOSIS REQUIRED)    Answer:   left thigh weakess and pain. Suspect L3 radculopathy. Level and technique per radiology    Order Specific Question:   Preferred Imaging Location?    Answer:   [id number]    Order Specific Question:   Radiology Contrast Protocol - do NOT remove file path    Answer:   [email or web address]   No orders of the defined types were placed in this encounter.    Discussed warning signs or symptoms. Please see discharge instructions. Patient expresses understanding.   The above documentation has been reviewed and is accurate and complete Lynne Leader, M.D.

## 2019-12-28 NOTE — Patient Instructions (Addendum)
Thank you for coming in today.  Please call [facility] at [phone] to schedule your spine injection.   Plan for injection.   Recheck a few weeks after the shot. If not improving we can do more.   STOP aspirin now. You need to be off the aspirin for at least 7 days.   Epidural Steroid Injection Patient Information  Description: The epidural space surrounds the nerves as they exit the spinal cord.  In some patients, the nerves can be compressed and inflamed by a bulging disc or a tight spinal canal (spinal stenosis).  By injecting steroids into the epidural space, we can bring irritated nerves into direct contact with a potentially helpful medication.  These steroids act directly on the irritated nerves and can reduce swelling and inflammation which often leads to decreased pain.  Epidural steroids may be injected anywhere along the spine and from the neck to the low back depending upon the location of your pain.   After numbing the skin with local anesthetic (like Novocaine), a small needle is passed into the epidural space slowly.  You may experience a sensation of pressure while this is being done.  The entire block usually last less than 10 minutes.  Conditions which may be treated by epidural steroids:   Low back and leg pain  Neck and arm pain  Spinal stenosis  Post-laminectomy syndrome  Herpes zoster (shingles) pain  Pain from compression fractures  Preparation for the injection:  1. Do not eat any solid food or dairy products within 8 hours of your appointment.  2. You may drink clear liquids up to 3 hours before appointment.  Clear liquids include water, black coffee, juice or soda.  No milk or cream please. 3. You may take your regular medication, including pain medications, with a sip of water before your appointment  Diabetics should hold regular insulin (if taken separately) and take 1/2 normal NPH dos the morning of the procedure.  Carry some sugar  containing items with you to your appointment. 4. A driver must accompany you and be prepared to drive you home after your procedure.  5. Bring all your current medications with your. 6. An IV may be inserted and sedation may be given at the discretion of the physician.   7. A blood pressure cuff, EKG and other monitors will often be applied during the procedure.  Some patients may need to have extra oxygen administered for a short period. 8. You will be asked to provide medical information, including your allergies, prior to the procedure.  We must know immediately if you are taking blood thinners (like Coumadin/Warfarin)  Or if you are allergic to IV iodine contrast (dye). We must know if you could possible be pregnant.  Possible side-effects:  Bleeding from needle site  Infection (rare, may require surgery)  Nerve injury (rare)  Numbness & tingling (temporary)  Difficulty urinating (rare, temporary)  Spinal headache ( a headache worse with upright posture)  Light -headedness (temporary)  Pain at injection site (several days)  Decreased blood pressure (temporary)  Weakness in arm/leg (temporary)  Pressure sensation in back/neck (temporary)  Call if you experience:  Fever/chills associated with headache or increased back/neck pain.  Headache worsened by an upright position.  New onset weakness or numbness of an extremity below the injection site  Hives or difficulty breathing (go to the emergency room)  Inflammation or drainage at the infection site  Severe back/neck pain  Any new symptoms which are concerning to  you  Please note:  Although the local anesthetic injected can often make your back or neck feel good for several hours after the injection, the pain will likely return.  It takes 3-7 days for steroids to work in the epidural space.  You may not notice any pain relief for at least that one week.  If effective, we will often do a series of three injections  spaced 3-6 weeks apart to maximally decrease your pain.  After the initial series, we generally will wait several months before considering a repeat injection of the same type.

## 2020-01-04 ENCOUNTER — Other Ambulatory Visit: Payer: Self-pay

## 2020-01-04 ENCOUNTER — Ambulatory Visit
Admission: RE | Admit: 2020-01-04 | Discharge: 2020-01-04 | Disposition: A | Discharge status: Home / Self Care | Payer: Medicare HMO | Source: Ambulatory Visit | Attending: Family Medicine | Admitting: Family Medicine

## 2020-01-04 DIAGNOSIS — M5441 Lumbago with sciatica, right side: Secondary | ICD-10-CM

## 2020-01-04 DIAGNOSIS — R29898 Other symptoms and signs involving the musculoskeletal system: Secondary | ICD-10-CM

## 2020-01-04 DIAGNOSIS — M48061 Spinal stenosis, lumbar region without neurogenic claudication: Secondary | ICD-10-CM | POA: Diagnosis not present

## 2020-01-04 DIAGNOSIS — M5442 Lumbago with sciatica, left side: Secondary | ICD-10-CM

## 2020-01-04 MED ORDER — METHYLPREDNISOLONE ACETATE 40 MG/ML INJ SUSP (RADIOLOG
120.0000 mg | Freq: Once | INTRAMUSCULAR | Status: AC
  Administered 2020-01-04: 120 mg via EPIDURAL

## 2020-01-04 MED ORDER — IOPAMIDOL (ISOVUE-M 200) INJECTION 41%
1.0000 mL | Freq: Once | INTRAMUSCULAR | Status: AC
  Administered 2020-01-04: 1 mL via EPIDURAL

## 2020-01-04 NOTE — Discharge Instructions (Signed)

## 2020-01-22 ENCOUNTER — Other Ambulatory Visit: Payer: Self-pay | Admitting: Internal Medicine

## 2020-01-22 ENCOUNTER — Telehealth: Payer: Self-pay | Admitting: Internal Medicine

## 2020-01-22 NOTE — Telephone Encounter (Signed)
LVM for pt to rtn my call to schedule AWV with [facility].  

## 2020-01-29 ENCOUNTER — Other Ambulatory Visit: Payer: Self-pay | Admitting: Internal Medicine

## 2020-01-29 ENCOUNTER — Ambulatory Visit: Payer: Medicare HMO

## 2020-01-29 DIAGNOSIS — H401231 Low-tension glaucoma, bilateral, mild stage: Secondary | ICD-10-CM | POA: Diagnosis not present

## 2020-01-30 NOTE — Progress Notes (Signed)
I, Peterson Lombard, LAT, ATC acting as a scribe for Lynne Leader, MD.  Colin Love is a 74 y.o. male who presents to [place] at [facility] today for f/u after epidural injection on 01/04/20 for L-spine pn and spinal stenosis. Pt was last seen by Dr. Georgina Snell on 12/28/19 and was advised to recheck after injection. Today, pt reports LBP is a bit better. Improvement in the strength in legs. Pt notes some R thigh numbness and rarely up into R hip  Aggravates: increased activity   Diagnostic testing: L-spine MRI- 12/21/19; L-spine XR- 05/22/19  Pertinent review of systems: No fevers or chills  Relevant historical information: Diabetes, history of bladder cancer   Exam:  BP 118/76 (BP Location: Right Arm, Patient Position: Sitting, Cuff Size: Normal)   Pulse 84   Ht 5\' 7"  (1.702 m)   Wt 191 lb 6.4 oz (86.8 kg)   SpO2 98%   BMI 29.98 kg/m  General: Well Developed, well nourished, and in no acute distress.   MSK: L-spine nontender.  Lower extremity strength is intact.    Lab and Radiology Results MR Lumbar Spine Wo Contrast  Result Date: 12/21/2019 CLINICAL DATA:  Low back pain for greater than 6 weeks. Radiating down legs. EXAM: MRI LUMBAR SPINE WITHOUT CONTRAST TECHNIQUE: Multiplanar, multisequence MR imaging of the lumbar spine was performed. No intravenous contrast was administered. COMPARISON:  Lumbar radiographs from Aug 04, 2018. FINDINGS: Segmentation:  5 non rib-bearing lumbar vertebral bodies. Alignment: Trace stepwise retrolisthesis L2 on L3 and L3 on L4. Otherwise, physiologic. Vertebrae: Scattered benign vertebral venous malformations in degenerative endplate signal changes about the L5-S1 disc. Otherwise, no focal marrow signal abnormality to suggest acute fracture, discitis/osteomyelitis, or suspicious bone marrow lesion. Conus medullaris and cauda equina: Conus extends to the L1 level. Conus appears normal. Paraspinal and other soft tissues: T2  hyperintense lesions involving bilateral kidneys, suboptimally evaluated but statistically most likely cysts. Disc levels: T12-L1: No significant disc protrusion, foraminal stenosis, or canal stenosis. L1-L2: Small left eccentric broad-based disc bulge with mild left foraminal stenosis. No significant right foraminal stenosis or canal stenosis. L2-L3: Broad-based disc bulge with small right paracentral disc protrusion. Mild canal stenosis and moderate bilateral subarticular recess stenosis. No significant foraminal stenosis. L3-L4: Broad-based disc bulge and bilateral facet hypertrophy. Resulting moderate canal stenosis and mild bilateral foraminal stenosis. Severe left and moderate right subarticular recess stenosis. L4-L5: Broad-based disc bulge, bilateral facet hypertrophy and ligamentum flavum thickening. Resulting mild canal stenosis and severe left and moderate right subarticular recess stenosis. Mild bilateral foraminal stenosis. L5-S1: Broad-based disc bulge with superimposed left subarticular/foraminal disc/osteophyte. Bilateral facet hypertrophy. Resulting moderate bilateral foraminal stenosis. Severe left subarticular recess stenosis. IMPRESSION: 1. Moderate canal stenosis at L3-L4 and mild canal stenosis at L2-L3 and L4-L5. 2. Moderate bilateral foraminal stenosis at L5-S1 where there is a left subarticular/foraminal disc/osteophyte. 3. Multilevel subarticular recess stenosis, severe on the left at L3-L4, L4-L5 and L5-S1. Electronically Signed   By: Margaretha Sheffield MD   On: 12/21/2019 12:21   DG INJECT DIAG/THERA/INC NEEDLE/CATH/PLC EPI/LUMB/SAC W/IMG  Result Date: 01/04/2020 CLINICAL DATA:  Low back and bilateral lower extremity pain. Moderate canal stenosis at L3-L4 and mild canal stenosis at L2-L3 and L4-L5. moderate bilateral foraminal stenosis at L5-S1 where there is a left subarticular/foraminal disc/osteophyte. multilevel subarticular recess stenosis, severe on the left at L3-L4, L4-L5 and  L5-S1. EXAM: LUMBAR EPIDURAL INJECTION: DIAGNOSTIC EPIDURAL INJECTION: THERAPEUTIC EPIDURAL INJECTION: PROCEDURE: The procedure, risks, benefits, and alternatives were  explained to the patient. Questions regarding the procedure were encouraged and answered. The patient understands and consents to the procedure. An interlaminar approach was performed on left at L3-4. The overlying skin was cleansed and anesthetized. A 20 gauge epidural needle was advanced using loss-of-resistance technique. Injection of Isovue-M 200 shows a good epidural pattern with spread above and below the level of needle placement, primarily on the left, but crossing the midline to some degree; no vascular opacification is seen. 120mg  of Depo-Medrol mixed with 51ml lidocaine 1% were instilled. The procedure was well-tolerated, and the patient was discharged thirty minutes following the injection in good condition. FLUOROSCOPY TIME:  29 seconds; 20 uGym2 DAP COMPLICATIONS: None immediate IMPRESSION: Technically successful epidural injection on the left at L3-4. Electronically Signed   By: Lucrezia Europe M.D.   On: 01/04/2020 15:15       Assessment and Plan: 74 y.o. male with history of lower leg pain due to spinal stenosis at L3-4.  Patient had great response to epidural steroid injection at L3-4.  His only remaining symptom is a little bit of thigh numbness on the right which most likely corresponds to the L3 nerve root dermatomal pattern.  Discussed options with patient.  Both agree that repeat injection at this time is not necessary.  Plan to continue activity as tolerated and recheck as needed.  Happy to order repeat injection in the future should his symptoms start to return.    Discussed warning signs or symptoms. Please see discharge instructions. Patient expresses understanding.   The above documentation has been reviewed and is accurate and complete Lynne Leader, M.D.    Total encounter time 20 minutes including face-to-face  time with the patient and, reviewing past medical record, and charting on the date of service.   Treatment plan options and next steps

## 2020-01-31 ENCOUNTER — Ambulatory Visit: Payer: Medicare HMO | Admitting: Family Medicine

## 2020-01-31 ENCOUNTER — Other Ambulatory Visit: Payer: Self-pay

## 2020-01-31 VITALS — BP 118/76 | HR 84 | Ht 67.0 in | Wt 191.4 lb

## 2020-01-31 DIAGNOSIS — M48061 Spinal stenosis, lumbar region without neurogenic claudication: Secondary | ICD-10-CM | POA: Diagnosis not present

## 2020-01-31 DIAGNOSIS — R29898 Other symptoms and signs involving the musculoskeletal system: Secondary | ICD-10-CM

## 2020-01-31 NOTE — Patient Instructions (Signed)
Thank you for coming in today.  We can repeat that injection in the future if needed.  We can do 3 in 6 months.  If these fail next step is surgery.   Keep active and recheck as needed.

## 2020-02-06 ENCOUNTER — Ambulatory Visit: Payer: Medicare HMO | Attending: Internal Medicine

## 2020-02-06 DIAGNOSIS — Z23 Encounter for immunization: Secondary | ICD-10-CM

## 2020-02-06 NOTE — Progress Notes (Signed)
Subjective:    Patient ID: Colin Love, male    DOB: 26-Sep-1945, 74 y.o.   MRN: [id number]  HPI The patient is here for follow up of their chronic medical problems, including DM, htn, hyperlipidemia, burning sensation in feet   He is taking all of his medications as prescribed.    He has been watching his sugars.  He exercises a little.  He is still smoking.  Medications and allergies reviewed with patient and updated if appropriate.  Patient Active Problem List   Diagnosis Date Noted  . Spinal stenosis of lumbar region without neurogenic claudication 01/31/2020  . Chondrocalcinosis of left knee 03/21/2019  . Sciatica of right side 08/04/2018  . Abnormal echocardiogram 05/26/2016  . Burning sensation of feet 05/26/2016  . Sudden right hearing loss 08/07/2015  . Irregular heart rhythm 08/07/2015  . Glaucoma 05/21/2015  . PAC (premature atrial contraction) 01/24/2014  . Bladder cancer ([place]) 01/09/2014  . Tobacco dependence due to cigarettes   . Diabetes type 2, controlled ([place]) 12/23/2009  . Dyslipidemia 12/23/2009  . Essential hypertension 12/23/2009  . COLONIC POLYPS, HX OF 12/23/2009  . DIVERTICULITIS, HX OF 12/23/2009    Current Outpatient Medications on File Prior to Visit  Medication Sig Dispense Refill  . ACCU-CHEK SOFTCLIX LANCETS lancets TEST UP TO 4 TIMES A DAY 300 each 1  . aspirin 81 MG tablet Take 81 mg by mouth daily.      Marland Kitchen atorvastatin (LIPITOR) 20 MG tablet TAKE 1 TABLET BY MOUTH ONCE DAILY AT 6PM 90 tablet 3  . blood glucose meter kit and supplies KIT Dispense based on patient and insurance preference. Use up to four times daily as directed. (FOR ICD-9 250.00, 250.01). 1 each 0  . diltiazem (CARDIZEM CD) 180 MG 24 hr capsule TAKE 1 CAPSULE BY MOUTH 2 TIMES A DAY 180 capsule 3  . gabapentin (NEURONTIN) 100 MG capsule Take 1-2 capsules (100-200 mg total) by mouth daily as needed. 90 capsule 3  . latanoprost (XALATAN) 0.005 % ophthalmic solution Place  1 drop into both eyes at bedtime.     Marland Kitchen lisinopril-hydrochlorothiazide (ZESTORETIC) 20-12.5 MG tablet TAKE 1 TABLET BY MOUTH 2 TIMES A DAY 180 tablet 3  . meloxicam (MOBIC) 15 MG tablet Take 15 mg by mouth daily.    . metFORMIN (GLUCOPHAGE) 500 MG tablet TAKE 1 TABLET BY MOUTH 2 TIMES DAILY WITH A MEAL. 180 tablet 1  . ONE TOUCH ULTRA TEST test strip TEST UP TO 4 TIMES DAILY 100 each 1  . potassium chloride (KLOR-CON) 10 MEQ tablet TAKE 3 TABLETS (30 MEQ TOTAL) BY MOUTH DAILY. 270 tablet 1  . tamsulosin (FLOMAX) 0.4 MG CAPS capsule Take 0.4 mg by mouth daily.    Marland Kitchen terazosin (HYTRIN) 10 MG capsule TAKE 1 CAPSULE BY MOUTH EVERYDAY AT BEDTIME 90 capsule 1   No current facility-administered medications on file prior to visit.    Past Medical History:  Diagnosis Date  . Adenoma of left adrenal gland   . At risk for sleep apnea    STOP-BANG= 4    SENT TO PCP 02-06-2014  . Bladder cancer ([place]) 02/2014   local, low grade pap uro carcinoma  . BPH (benign prostatic hypertrophy)   . Diverticulosis of colon   . Full dentures   . GERD (gastroesophageal reflux disease)   . History of adenomatous polyp of colon   . History of diverticulitis of colon   . Hyperlipidemia   . Hypertension   .  Murmur, cardiac   . PAC (premature atrial contraction)   . Type 2 diabetes mellitus ([place])     Past Surgical History:  Procedure Laterality Date  . ANTERIOR CERVICAL DECOMP/DISCECTOMY FUSION  05-24-2003   C4  --  C7  . COLONOSCOPY W/ POLYPECTOMY  last one 07-24-2013 w/ EGD  . CYSTOSCOPY W/ RETROGRADES Bilateral 02/09/2014   Procedure: BILATERAL RETROGRADE PYELOGRAM , Cystoscopy;  Surgeon: Ardis Hughs, MD;  Location: [facility];  Service: Urology;  Laterality: Bilateral;  . TRANSURETHRAL RESECTION OF BLADDER TUMOR WITH GYRUS (TURBT-GYRUS) N/A 02/09/2014   Procedure: Bladder Biopsies;  Surgeon: Ardis Hughs, MD;  Location: [facility];  Service: Urology;  Laterality:  N/A;    Social History   Socioeconomic History  . Marital status: Married    Spouse name: Not on file  . Number of children: Not on file  . Years of education: Not on file  . Highest education level: Not on file  Occupational History  . Not on file  Tobacco Use  . Smoking status: Current Every Day Smoker    Packs/day: 0.50    Years: 40.00    Pack years: 20.00    Types: Cigarettes  . Smokeless tobacco: Never Used  Vaping Use  . Vaping Use: Never used  Substance and Sexual Activity  . Alcohol use: Yes    Comment: OCCASIONAL  . Drug use: No  . Sexual activity: Not Currently  Other Topics Concern  . Not on file  Social History Narrative  . Not on file   Social Determinants of Health   Financial Resource Strain:   . Difficulty of Paying Living Expenses: Not on file  Food Insecurity:   . Worried About Charity fundraiser in the Last Year: Not on file  . Ran Out of Food in the Last Year: Not on file  Transportation Needs:   . Lack of Transportation (Medical): Not on file  . Lack of Transportation (Non-Medical): Not on file  Physical Activity:   . Days of Exercise per Week: Not on file  . Minutes of Exercise per Session: Not on file  Stress:   . Feeling of Stress : Not on file  Social Connections:   . Frequency of Communication with Friends and Family: Not on file  . Frequency of Social Gatherings with Friends and Family: Not on file  . Attends Religious Services: Not on file  . Active Member of Clubs or Organizations: Not on file  . Attends Archivist Meetings: Not on file  . Marital Status: Not on file    Family History  Problem Relation Age of Onset  . Colon cancer Mother   . Hypertension Mother   . Hyperlipidemia Mother   . Hypertension Father   . Hyperlipidemia Father   . Diabetes Brother     Review of Systems  Constitutional: Negative for chills and fever.  Respiratory: Positive for cough (sometimes). Negative for shortness of breath and  wheezing.   Cardiovascular: Negative for chest pain, palpitations and leg swelling.  Neurological: Negative for light-headedness and headaches.       Objective:   Vitals:   02/07/20 1000  BP: 126/82  Pulse: (!) 58  Temp: 98.3 F (36.8 C)  SpO2: 97%   BP Readings from Last 3 Encounters:  02/07/20 126/82  01/31/20 118/76  01/04/20 (!) 151/85   Wt Readings from Last 3 Encounters:  02/07/20 190 lb 12.8 oz (86.5 kg)  01/31/20 191 lb  6.4 oz (86.8 kg)  12/28/19 190 lb (86.2 kg)   Body mass index is 29.88 kg/m.   Physical Exam    Constitutional: Appears well-developed and well-nourished. No distress.  HENT:  Head: Normocephalic and atraumatic.  Neck: Neck supple. No tracheal deviation present. No thyromegaly present.  No cervical lymphadenopathy Cardiovascular: Normal rate, regular rhythm and normal heart sounds.   No murmur heard. No carotid bruit .  No edema Pulmonary/Chest: Effort normal and breath sounds normal. No respiratory distress. No has no wheezes. No rales.  Skin: Skin is warm and dry. Not diaphoretic.  Psychiatric: Normal mood and affect. Behavior is normal.      Assessment & Plan:    See Problem List for Assessment and Plan of chronic medical problems.    This visit occurred during the SARS-CoV-2 public health emergency.  Safety protocols were in place, including screening questions prior to the visit, additional usage of staff PPE, and extensive cleaning of exam room while observing appropriate contact time as indicated for disinfecting solutions.

## 2020-02-06 NOTE — Progress Notes (Signed)
   Covid-19 Vaccination Clinic  Name:  VENANCIO CHENIER    MRN: [id number] DOB: 1945-12-27  02/06/2020  Mr. Kropp was observed post Covid-19 immunization for 15 minutes without incident. He was provided with Vaccine Information Sheet and instruction to access the V-Safe system.   Mr. Ropp was instructed to call 911 with any severe reactions post vaccine: Marland Kitchen Difficulty breathing  . Swelling of face and throat  . A fast heartbeat  . A bad rash all over body  . Dizziness and weakness   Immunizations Administered    Name Date Dose VIS Date Route   Pfizer COVID-19 Vaccine 02/06/2020  2:37 PM 0.3 mL 12/27/2019 Intramuscular   Manufacturer: [place]   Lot: [id number]   [place]: [id number]

## 2020-02-06 NOTE — Patient Instructions (Addendum)
  Blood work was ordered.     Flu immunization administered today.     Medications changes include :   none    Please followup in 6 months  

## 2020-02-07 ENCOUNTER — Other Ambulatory Visit: Payer: Self-pay

## 2020-02-07 ENCOUNTER — Ambulatory Visit (INDEPENDENT_AMBULATORY_CARE_PROVIDER_SITE_OTHER): Payer: Medicare HMO | Admitting: Internal Medicine

## 2020-02-07 ENCOUNTER — Encounter: Payer: Self-pay | Admitting: Internal Medicine

## 2020-02-07 VITALS — BP 126/82 | HR 58 | Temp 98.3°F | Ht 67.0 in | Wt 190.8 lb

## 2020-02-07 DIAGNOSIS — F17210 Nicotine dependence, cigarettes, uncomplicated: Secondary | ICD-10-CM

## 2020-02-07 DIAGNOSIS — Z23 Encounter for immunization: Secondary | ICD-10-CM | POA: Diagnosis not present

## 2020-02-07 DIAGNOSIS — E119 Type 2 diabetes mellitus without complications: Secondary | ICD-10-CM

## 2020-02-07 DIAGNOSIS — E785 Hyperlipidemia, unspecified: Secondary | ICD-10-CM | POA: Diagnosis not present

## 2020-02-07 DIAGNOSIS — I10 Essential (primary) hypertension: Secondary | ICD-10-CM

## 2020-02-07 DIAGNOSIS — R208 Other disturbances of skin sensation: Secondary | ICD-10-CM | POA: Diagnosis not present

## 2020-02-07 DIAGNOSIS — R69 Illness, unspecified: Secondary | ICD-10-CM | POA: Diagnosis not present

## 2020-02-07 LAB — CBC WITH DIFFERENTIAL/PLATELET
Basophils Absolute: 0 10*3/uL (ref 0.0–0.1)
Basophils Relative: 0.4 % (ref 0.0–3.0)
Eosinophils Absolute: 0.1 10*3/uL (ref 0.0–0.7)
Eosinophils Relative: 0.8 % (ref 0.0–5.0)
HCT: 46.9 % (ref 39.0–52.0)
Hemoglobin: 15.4 g/dL (ref 13.0–17.0)
Lymphocytes Relative: 12.3 % (ref 12.0–46.0)
Lymphs Abs: 0.9 10*3/uL (ref 0.7–4.0)
MCHC: 32.9 g/dL (ref 30.0–36.0)
MCV: 86.7 fl (ref 78.0–100.0)
Monocytes Absolute: 0.7 10*3/uL (ref 0.1–1.0)
Monocytes Relative: 9 % (ref 3.0–12.0)
Neutro Abs: 5.7 10*3/uL (ref 1.4–7.7)
Neutrophils Relative %: 77.5 % — ABNORMAL HIGH (ref 43.0–77.0)
Platelets: 185 10*3/uL (ref 150.0–400.0)
RBC: 5.41 Mil/uL (ref 4.22–5.81)
RDW: 15.3 % (ref 11.5–15.5)
WBC: 7.4 10*3/uL (ref 4.0–10.5)

## 2020-02-07 LAB — COMPREHENSIVE METABOLIC PANEL WITH GFR
ALT: 19 U/L (ref 0–53)
AST: 21 U/L (ref 0–37)
Albumin: 4.1 g/dL (ref 3.5–5.2)
Alkaline Phosphatase: 65 U/L (ref 39–117)
BUN: 17 mg/dL (ref 6–23)
CO2: 30 meq/L (ref 19–32)
Calcium: 10.4 mg/dL (ref 8.4–10.5)
Chloride: 101 meq/L (ref 96–112)
Creatinine, Ser: 0.93 mg/dL (ref 0.40–1.50)
GFR: 80.52 mL/min
Glucose, Bld: 112 mg/dL — ABNORMAL HIGH (ref 70–99)
Potassium: 3.4 meq/L — ABNORMAL LOW (ref 3.5–5.1)
Sodium: 138 meq/L (ref 135–145)
Total Bilirubin: 0.9 mg/dL (ref 0.2–1.2)
Total Protein: 7.1 g/dL (ref 6.0–8.3)

## 2020-02-07 LAB — LIPID PANEL
Cholesterol: 143 mg/dL (ref 0–200)
HDL: 51.7 mg/dL
LDL Cholesterol: 72 mg/dL (ref 0–99)
NonHDL: 91.65
Total CHOL/HDL Ratio: 3
Triglycerides: 97 mg/dL (ref 0.0–149.0)
VLDL: 19.4 mg/dL (ref 0.0–40.0)

## 2020-02-07 LAB — HEMOGLOBIN A1C: Hgb A1c MFr Bld: 6.5 % (ref 4.6–6.5)

## 2020-02-07 NOTE — Assessment & Plan Note (Signed)
Chronic Sugars have been controlled-check A1c today Continue Metformin 500 mg twice daily-we will adjust medication if necessary Encouraged regular exercise, diabetic diet Eye exams up-to-date-has one coming up

## 2020-02-07 NOTE — Assessment & Plan Note (Signed)
Chronic Check lipid panel  Continue atorvastatin 20 mg daily Regular exercise and healthy diet encouraged  

## 2020-02-07 NOTE — Assessment & Plan Note (Addendum)
Smoking cessation was discussed for more than 3 minutes.  The patient was counseled on the dangers of tobacco use, and was advised to quit and wants to quit and knows he needs to quit.  Reviewed ways of quitting smoking including nicotine replacement, vapping/e-cigarettes, cold [place], weaning off cigarettes, and pharmacotherapy (wellbutrin and chantix).  He is concerned about any side effect from medication and I prefer to avoid e-cigarettes.  He has quit in the past.  He has been thinking about trying nicotine replacement.  He does want to quit, encouraged him to call for the free patches.

## 2020-02-07 NOTE — Addendum Note (Signed)
Addended by: Marcina Millard on: 02/07/2020 03:15 PM   Modules accepted: Orders

## 2020-02-07 NOTE — Assessment & Plan Note (Signed)
Chronic BP well controlled Continue diltiazem 180 mg twice daily, lisinopril-hydrochlorothiazide 20-12.5 mg twice daily, Terazosin 10 mg daily cmp

## 2020-02-07 NOTE — Assessment & Plan Note (Signed)
Chronic Continue gabapentin 100-200 mg daily as needed

## 2020-02-23 ENCOUNTER — Telehealth: Payer: Self-pay | Admitting: Family Medicine

## 2020-02-23 DIAGNOSIS — R29898 Other symptoms and signs involving the musculoskeletal system: Secondary | ICD-10-CM

## 2020-02-23 DIAGNOSIS — M48061 Spinal stenosis, lumbar region without neurogenic claudication: Secondary | ICD-10-CM

## 2020-02-23 NOTE — Telephone Encounter (Signed)
Patient called asking if another epidural could be ordered for him.  He said that he is having continued back and hip pain.  Please advise.

## 2020-02-26 NOTE — Telephone Encounter (Signed)
Called pt and relayed info regarding order of repeat ESI.  Pt advised to call [facility] to schedule and he verbalizes understanding.

## 2020-02-26 NOTE — Telephone Encounter (Signed)
Last lumbar ESI was on 01/04/20.

## 2020-02-26 NOTE — Telephone Encounter (Signed)
Epidural steroid injection ordered. Please contact [facility] to schedule. Phone number is [phone].

## 2020-02-29 ENCOUNTER — Other Ambulatory Visit: Payer: Self-pay

## 2020-02-29 ENCOUNTER — Ambulatory Visit
Admission: RE | Admit: 2020-02-29 | Discharge: 2020-02-29 | Disposition: A | Discharge status: Home / Self Care | Payer: Medicare HMO | Source: Ambulatory Visit | Attending: Family Medicine | Admitting: Family Medicine

## 2020-02-29 DIAGNOSIS — M48061 Spinal stenosis, lumbar region without neurogenic claudication: Secondary | ICD-10-CM

## 2020-02-29 DIAGNOSIS — R29898 Other symptoms and signs involving the musculoskeletal system: Secondary | ICD-10-CM

## 2020-02-29 DIAGNOSIS — M47817 Spondylosis without myelopathy or radiculopathy, lumbosacral region: Secondary | ICD-10-CM | POA: Diagnosis not present

## 2020-02-29 MED ORDER — METHYLPREDNISOLONE ACETATE 40 MG/ML INJ SUSP (RADIOLOG
120.0000 mg | Freq: Once | INTRAMUSCULAR | Status: AC
  Administered 2020-02-29: 120 mg via EPIDURAL

## 2020-02-29 MED ORDER — IOPAMIDOL (ISOVUE-M 200) INJECTION 41%
1.0000 mL | Freq: Once | INTRAMUSCULAR | Status: AC
  Administered 2020-02-29: 1 mL via EPIDURAL

## 2020-02-29 NOTE — Discharge Instructions (Signed)

## 2020-04-12 ENCOUNTER — Telehealth: Payer: Self-pay | Admitting: Internal Medicine

## 2020-04-12 NOTE — Progress Notes (Signed)
  Chronic Care Management   Outreach Note  04/12/2020 Name: ISAID SALVIA MRN: [id number] DOB: Aug 04, 1945  Referred by: Binnie Rail, MD Reason for referral : No chief complaint on file.   An unsuccessful telephone outreach was attempted today. The patient was referred to the pharmacist for assistance with care management and care coordination.   Follow Up Plan:   Carley Perdue [employer] Scheduler

## 2020-04-23 DIAGNOSIS — C672 Malignant neoplasm of lateral wall of bladder: Secondary | ICD-10-CM | POA: Diagnosis not present

## 2020-04-26 ENCOUNTER — Telehealth: Payer: Self-pay | Admitting: Internal Medicine

## 2020-04-26 NOTE — Progress Notes (Signed)
  Chronic Care Management   Outreach Note  04/26/2020 Name: Colin Love MRN: [id number] DOB: Apr 05, 1945  Referred by: Binnie Rail, MD Reason for referral : No chief complaint on file.   A second unsuccessful telephone outreach was attempted today. The patient was referred to pharmacist for assistance with care management and care coordination.  Follow Up Plan:   Carley Perdue [employer] Scheduler

## 2020-05-01 ENCOUNTER — Telehealth: Payer: Self-pay | Admitting: Internal Medicine

## 2020-05-01 NOTE — [place] Notes (Signed)
  Chronic Care Management   Outreach Note  05/01/2020 Name: Colin Love MRN: [id number] DOB: 1946-02-01  Referred by: Binnie Rail, MD Reason for referral : No chief complaint on file.   Third unsuccessful telephone outreach was attempted today. The patient was referred to the pharmacist for assistance with care management and care coordination.   Follow Up Plan:   Carley Perdue [employer] Scheduler

## 2020-05-02 ENCOUNTER — Telehealth: Payer: Self-pay | Admitting: Internal Medicine

## 2020-05-02 NOTE — [place] Notes (Signed)
  Chronic Care Management   Note  05/02/2020 Name: Colin Love MRN: [id number] DOB: March 04, 1946  Colin Love is a 75 y.o. year old male who is a primary care patient of Burns, Claudina Lick, MD. I reached out to Colin Love by phone today in response to a referral sent by Colin Love's PCP, Binnie Rail, MD.   [name] was given information about Chronic Care Management services today including:  1. CCM service includes personalized support from designated clinical staff supervised by his physician, including individualized plan of care and coordination with other care providers 2. 24/7 contact phone numbers for assistance for urgent and routine care needs. 3. Service will only be billed when office clinical staff spend 20 minutes or more in a month to coordinate care. 4. Only one practitioner may furnish and bill the service in a calendar month. 5. The patient may stop CCM services at any time (effective at the end of the month) by phone call to the office staff.   Patient agreed to services and verbal consent obtained.   Follow up plan:   Carley Perdue [employer] Scheduler

## 2020-05-16 ENCOUNTER — Other Ambulatory Visit: Payer: Self-pay | Admitting: Internal Medicine

## 2020-05-21 ENCOUNTER — Telehealth: Payer: Self-pay | Admitting: Internal Medicine

## 2020-05-21 DIAGNOSIS — R69 Illness, unspecified: Secondary | ICD-10-CM | POA: Diagnosis not present

## 2020-05-21 DIAGNOSIS — H409 Unspecified glaucoma: Secondary | ICD-10-CM | POA: Diagnosis not present

## 2020-05-21 DIAGNOSIS — N529 Male erectile dysfunction, unspecified: Secondary | ICD-10-CM | POA: Diagnosis not present

## 2020-05-21 DIAGNOSIS — E1165 Type 2 diabetes mellitus with hyperglycemia: Secondary | ICD-10-CM | POA: Diagnosis not present

## 2020-05-21 DIAGNOSIS — I2510 Atherosclerotic heart disease of native coronary artery without angina pectoris: Secondary | ICD-10-CM | POA: Diagnosis not present

## 2020-05-21 DIAGNOSIS — E785 Hyperlipidemia, unspecified: Secondary | ICD-10-CM | POA: Diagnosis not present

## 2020-05-21 DIAGNOSIS — I10 Essential (primary) hypertension: Secondary | ICD-10-CM | POA: Diagnosis not present

## 2020-05-21 DIAGNOSIS — G8929 Other chronic pain: Secondary | ICD-10-CM | POA: Diagnosis not present

## 2020-05-21 DIAGNOSIS — R32 Unspecified urinary incontinence: Secondary | ICD-10-CM | POA: Diagnosis not present

## 2020-05-21 DIAGNOSIS — N400 Enlarged prostate without lower urinary tract symptoms: Secondary | ICD-10-CM | POA: Diagnosis not present

## 2020-05-21 NOTE — Telephone Encounter (Signed)
Spoke with patient today. He is fine, no symptoms noted. He will call and make appointment if he feels like he needs to be seen.

## 2020-05-21 NOTE — Telephone Encounter (Signed)
He has a known irregular rhythm - if he has any concerning symptoms he should come in for a visit, if not we can f/u at his next appt.

## 2020-05-21 NOTE — Telephone Encounter (Signed)
Colin Love the NP with [facility] just finished his annual wellness visit with the patient and wanted to inform Dr.Burns of his irregular heartbeat .

## 2020-06-14 ENCOUNTER — Telehealth: Payer: Self-pay | Admitting: *Deleted

## 2020-06-14 DIAGNOSIS — M48061 Spinal stenosis, lumbar region without neurogenic claudication: Secondary | ICD-10-CM

## 2020-06-14 NOTE — Telephone Encounter (Signed)
Pt called requesting an order for epidural to be sent over to [facility].

## 2020-06-14 NOTE — Telephone Encounter (Signed)
Epidural steroid injection ordered.  Please call [facility] to schedule.  Phone number is [phone].

## 2020-06-17 NOTE — Telephone Encounter (Signed)
Spoke w/ pt and provided the phone number for scheduling.

## 2020-06-26 ENCOUNTER — Telehealth: Payer: Self-pay

## 2020-06-26 NOTE — Progress Notes (Signed)
    Chronic Care Management Pharmacy Assistant   Name: DEAGLAN LILE  MRN: [id number] DOB: [birth year]  Reason for Encounter: Initial Questions Appointment: OV 06/27/20 @ 9 am    Recent office visits:  02/07/20 Burns (PCP) - Patient had no med changes and instructed to in f/u 6 mos.  Recent consult visits:  12/28/19 Georgina Snell (Sports Medicine) Patient was given a trial epidural steroid injection, and instructed to f/u in a few weeks.   01/29/20 Lyles (Ophthalmology) - Low-tension Glaucoma  01/29/20 Georgina Snell (Sports Medicine) Patient had no med changes, and instructed to f/u whenever needed.  04/23/20 Herrick (Urology) - Malignant neoplasm of lateral wall of bladder  Hospital visits:  None in previous 6 months  Medications: Outpatient Encounter Medications as of 06/26/2020  Medication Sig  . ACCU-CHEK SOFTCLIX LANCETS lancets TEST UP TO 4 TIMES A DAY  . aspirin 81 MG tablet Take 81 mg by mouth daily.    Marland Kitchen atorvastatin (LIPITOR) 20 MG tablet TAKE 1 TABLET BY MOUTH ONCE DAILY AT 6PM  . blood glucose meter kit and supplies KIT Dispense based on patient and insurance preference. Use up to four times daily as directed. (FOR ICD-9 250.00, 250.01).  Marland Kitchen diltiazem (CARDIZEM CD) 180 MG 24 hr capsule TAKE 1 CAPSULE BY MOUTH 2 TIMES A DAY  . gabapentin (NEURONTIN) 100 MG capsule Take 1-2 capsules (100-200 mg total) by mouth daily as needed.  . latanoprost (XALATAN) 0.005 % ophthalmic solution Place 1 drop into both eyes at bedtime.   Marland Kitchen lisinopril-hydrochlorothiazide (ZESTORETIC) 20-12.5 MG tablet TAKE 1 TABLET BY MOUTH 2 TIMES A DAY  . meloxicam (MOBIC) 15 MG tablet Take 15 mg by mouth daily.  . metFORMIN (GLUCOPHAGE) 500 MG tablet TAKE 1 TABLET BY MOUTH 2 TIMES DAILY WITH A MEAL.  Marland Kitchen ONE TOUCH ULTRA TEST test strip TEST UP TO 4 TIMES DAILY  . potassium chloride (KLOR-CON) 10 MEQ tablet TAKE 3 TABLETS (30 MEQ TOTAL) BY MOUTH DAILY.  . tamsulosin (FLOMAX) 0.4 MG CAPS capsule Take 0.4 mg by mouth daily.   Marland Kitchen terazosin (HYTRIN) 10 MG capsule TAKE 1 CAPSULE BY MOUTH EVERYDAY AT BEDTIME   No facility-administered encounter medications on file as of 06/26/2020.   Have you seen any other providers since your last visit?   Any changes in your medications or health?   Any side effects from any medications?   Do you have an symptoms or problems not managed by your medications?  Any concerns about your health right now?   Has your provider asked that you check blood pressure, blood sugar, or follow special diet at home?   Do you get any type of exercise on a regular basis?   Can you think of a goal you would like to reach for your health?   Do you have any problems getting your medications?    Is there anything that you would like to discuss during the appointment?   Please bring medications and supplements to appointment  Star Rating Drugs: Atorvastatin - last fill 05/10/20 90D Metformin - last fill 04/26/20 Gu-Win, RMA Clinical Pharmacists Assistant [phone]  Unable to reach patient before appointment! Time Spent: 46

## 2020-06-27 ENCOUNTER — Ambulatory Visit: Payer: Medicare HMO

## 2020-06-27 NOTE — Progress Notes (Deleted)
Chronic Care Management Pharmacy Note  06/27/2020 Name:  Colin Love MRN:  [id number] DOB:  1946/03/07  Subjective: Colin Love is an 75 y.o. year old male who is a primary patient of Burns, Claudina Lick, MD.  The CCM team was consulted for assistance with disease management and care coordination needs.    {CCMTELEPHONEFACETOFACE:21091510} for {CCMINITIALFOLLOWUPCHOICE:21091511} in response to provider referral for pharmacy case management and/or care coordination services.   Consent to Services:  {CCMCONSENTOPTIONS:25074}  Patient Care Team: Binnie Rail, MD as PCP - General (Internal Medicine) Juanita Craver, MD as Consulting Physician (Gastroenterology) Ardis Hughs, MD (Urology) Jerline Pain, MD (Cardiology) Charlton Haws, [facility] as Pharmacist (Pharmacist)  Recent office visits: 02/07/20 Burns (PCP) - no changes, labs stable. f/u 6 mos  Recent consult visits: 12/28/19 Georgina Snell (Sports Medicine) trial epidural steroid injection, f/u in a few weeks.   01/29/20 Lyles (Ophthalmology): f/u glaucoma.  01/29/20 Georgina Snell (Sports Medicine): f/u leg weakness, spinal stenosis. S/p epidural spine injection. no changes, f/u prn  04/23/20 Louis Meckel (Urology): f/u bladder cancer  Hospital visits: None in previous 6 months  Objective:  Lab Results  Component Value Date   CREATININE 0.93 02/07/2020   BUN 17 02/07/2020   GFR 80.52 02/07/2020   GFRNONAA 103.57 12/23/2009   NA 138 02/07/2020   K 3.4 (L) 02/07/2020   CALCIUM 10.4 02/07/2020   CO2 30 02/07/2020   GLUCOSE 112 (H) 02/07/2020    Lab Results  Component Value Date/Time   HGBA1C 6.5 02/07/2020 10:51 AM   HGBA1C 6.7 (H) 08/08/2019 10:49 AM   GFR 80.52 02/07/2020 10:51 AM   GFR 103.61 08/08/2019 10:49 AM   MICROALBUR 11.2 (H) 11/13/2014 12:39 PM   MICROALBUR 66.9 (H) 01/15/2014 09:10 AM    Last diabetic Eye exam:  Lab Results  Component Value Date/Time   HMDIABEYEEXA No Retinopathy 07/18/2018 12:00 AM     Last diabetic Foot exam:  Lab Results  Component Value Date/Time   HMDIABFOOTEX Normal 12/23/2009 12:00 AM     Lab Results  Component Value Date   CHOL 143 02/07/2020   HDL 51.70 02/07/2020   LDLCALC 72 02/07/2020   TRIG 97.0 02/07/2020   CHOLHDL 3 02/07/2020    Hepatic Function Latest Ref Rng & Units 02/07/2020 08/08/2019 02/07/2019  Total Protein 6.0 - 8.3 g/dL 7.1 6.8 6.9  Albumin 3.5 - 5.2 g/dL 4.1 4.3 4.1  AST 0 - 37 U/L _0 ALT 0 - 53 U/L _1 Alk Phosphatase 39 - 117 U/L 65 61 63  Total Bilirubin 0.2 - 1.2 mg/dL 0.9 0.7 0.6  Bilirubin, Direct 0.0 - 0.3 mg/dL - - -    Lab Results  Component Value Date/Time   TSH 1.20 05/27/2017 11:13 AM   TSH 1.37 08/07/2015 03:04 PM    CBC Latest Ref Rng & Units 02/07/2020 08/04/2018 05/27/2017  WBC 4.0 - 10.5 K/uL 7.4 7.0 7.3  Hemoglobin 13.0 - 17.0 g/dL 15.4 14.4 14.6  Hematocrit 39.0 - 52.0 % 46.9 42.2 44.0  Platelets 150.0 - 400.0 K/uL 185.0 186.0 167.0    No results found for: VD25OH  Clinical ASCVD: No  The 10-year ASCVD risk score Mikey Bussing DC Jr., et al., 2013) is: 55.3%   Values used to calculate the score:     Age: 24 years     Sex: Male     Is Non-Hispanic African American: Yes     Diabetic: Yes     Tobacco smoker:  Yes     Systolic Blood Pressure: 735 mmHg     Is BP treated: Yes     HDL Cholesterol: 51.7 mg/dL     Total Cholesterol: 143 mg/dL    Depression screen [facility] 2/9 02/07/2020 02/07/2019 08/04/2018  Decreased Interest 0 0 0  Down, Depressed, Hopeless 0 0 -  PHQ - 2 Score 0 0 0  Altered sleeping - - -  Tired, decreased energy - - -  Change in appetite - - -  Feeling bad or failure about yourself  - - -  Trouble concentrating - - -  Moving slowly or fidgety/restless - - -  Suicidal thoughts - - -  PHQ-9 Score - - -  Difficult doing work/chores - - -     Lab Results  Component Value Date/Time   PSA 3.21 08/08/2019 10:49 AM    Social History   Tobacco Use  Smoking Status Current Every  Day Smoker  . Packs/day: 0.50  . Years: 40.00  . Pack years: 20.00  . Types: Cigarettes  Smokeless Tobacco Never Used   BP Readings from Last 3 Encounters:  02/29/20 134/84  02/07/20 126/82  01/31/20 118/76   Pulse Readings from Last 3 Encounters:  02/29/20 89  02/07/20 (!) 58  01/31/20 84   Wt Readings from Last 3 Encounters:  02/07/20 190 lb 12.8 oz (86.5 kg)  01/31/20 191 lb 6.4 oz (86.8 kg)  12/28/19 190 lb (86.2 kg)   BMI Readings from Last 3 Encounters:  02/07/20 29.88 kg/m  01/31/20 29.98 kg/m  12/28/19 29.76 kg/m    Assessment/Interventions: Review of patient past medical history, allergies, medications, health status, including review of consultants reports, laboratory and other test data, was performed as part of comprehensive evaluation and provision of chronic care management services.   SDOH:  (Social Determinants of Health) assessments and interventions performed: Yes  SDOH Screenings   Alcohol Screen: Not on file  Depression (PHQ2-9): Low Risk   . PHQ-2 Score: 0  Financial Resource Strain: Not on file  Food Insecurity: Not on file  Housing: Not on file  Physical Activity: Not on file  Social Connections: Not on file  Stress: Not on file  Tobacco Use: High Risk  . Smoking Tobacco Use: Current Every Day Smoker  . Smokeless Tobacco Use: Never Used  Transportation Needs: Not on file    [place]  No Known Allergies  Medications Reviewed Today    Reviewed by Binnie Rail, MD (Physician) on 02/07/20 at 1027  Med List Status: <None>  Medication Order Taking? Sig Documenting Provider Last Dose Status Informant  ACCU-CHEK SOFTCLIX LANCETS lancets [id number] Yes TEST UP TO 4 TIMES A DAY Burns, Claudina Lick, MD Taking Active   aspirin 81 MG tablet [id number] Yes Take 81 mg by mouth daily.   [provider] Taking Active   atorvastatin (LIPITOR) 20 MG tablet [id number] Yes TAKE 1 TABLET BY MOUTH ONCE DAILY AT Robinette Haines, MD Taking Active    blood glucose meter kit and supplies KIT [id number] Yes Dispense based on patient and insurance preference. Use up to four times daily as directed. (FOR ICD-9 250.00, 250.01). Binnie Rail, MD Taking Active   diltiazem [facility] CD) 180 MG 24 hr capsule [id number] Yes TAKE 1 CAPSULE BY MOUTH 2 TIMES A DAY Burns, Claudina Lick, MD Taking Active   gabapentin (NEURONTIN) 100 MG capsule [id number] Yes Take 1-2 capsules (100-200 mg total) by mouth daily as needed. Binnie Rail,  MD Taking Active   latanoprost (XALATAN) 0.005 % ophthalmic solution [id number] Yes Place 1 drop into both eyes at bedtime.  [provider] Taking Active            Med Note Tori Milks Jul 06, 2016  9:42 AM)    lisinopril-hydrochlorothiazide (ZESTORETIC) 20-12.5 MG tablet [id number] Yes TAKE 1 TABLET BY MOUTH 2 TIMES A DAY Burns, Claudina Lick, MD Taking Active   meloxicam (MOBIC) 15 MG tablet [id number] Yes Take 15 mg by mouth daily. [provider] Taking Active   metFORMIN (GLUCOPHAGE) 500 MG tablet [id number] Yes TAKE 1 TABLET BY MOUTH 2 TIMES DAILY WITH A MEAL. Binnie Rail, MD Taking Active   ONE TOUCH ULTRA TEST test strip [id number] Yes TEST UP TO 4 TIMES DAILY Binnie Rail, MD Taking Active   potassium chloride (KLOR-CON) 10 MEQ tablet [id number] Yes TAKE 3 TABLETS (30 MEQ TOTAL) BY MOUTH DAILY. Binnie Rail, MD Taking Active   tamsulosin [facility]) 0.4 MG CAPS capsule [id number] Yes Take 0.4 mg by mouth daily. [provider] Taking Active   terazosin (HYTRIN) 10 MG capsule [id number] Yes TAKE 1 CAPSULE BY MOUTH EVERYDAY AT BEDTIME Binnie Rail, MD Taking Active           Patient Active Problem List   Diagnosis Date Noted  . Spinal stenosis of lumbar region without neurogenic claudication 01/31/2020  . Chondrocalcinosis of left knee 03/21/2019  . Sciatica of right side 08/04/2018  . Abnormal echocardiogram 05/26/2016  . Burning sensation of feet 05/26/2016  . Sudden right hearing  loss 08/07/2015  . Irregular heart rhythm 08/07/2015  . Glaucoma 05/21/2015  . PAC (premature atrial contraction) 01/24/2014  . Bladder cancer ([place]) 01/09/2014  . Tobacco dependence due to cigarettes   . Diabetes type 2, controlled ([place]) 12/23/2009  . Dyslipidemia 12/23/2009  . Essential hypertension 12/23/2009  . COLONIC POLYPS, HX OF 12/23/2009  . DIVERTICULITIS, HX OF 12/23/2009    Immunization History  Administered Date(s) Administered  . Fluad Quad(high Dose 65+) 02/07/2019, 02/07/2020  . Influenza Split 03/20/2011  . Influenza, High Dose Seasonal PF 01/30/2013, 01/09/2014, 11/13/2014, 11/26/2015, 11/26/2016, 11/29/2017  . Influenza, Seasonal, Injecte, Preservative Fre 03/17/2012  . PFIZER(Purple Top)SARS-COV-2 Vaccination 04/20/2019, 05/15/2019, 02/06/2020  . Pneumococcal Conjugate-13 11/13/2014  . Pneumococcal Polysaccharide-23 03/20/2011  . Tdap 07/02/2010    Conditions to be addressed/monitored:  {USCCMDZASSESSMENTOPTIONS:23563}  There are no care plans that you recently modified to display for this patient.    Medication Assistance: {MEDASSISTANCEINFO:25044}  Patient's preferred pharmacy is:  [id number] [place] [address] [address]Phone: [phone]: [phone] Uses pill box? {Yes or If no, why not?:20788} Pt endorses ***% compliance  We discussed: {Pharmacy options:24294} Patient decided to: {US Pharmacy Plan:23885}  Care Plan and Follow Up Patient Decision:  {FOLLOWUP:24991}  Plan: {CM FOLLOW UP PNVBT:66060} ***    Current Barriers:  . {pharmacybarriers:24917}  Pharmacist Clinical Goal(s):  .Marland KitchenPatient will {PHARMACYGOALCHOICES:24921} through collaboration with PharmD and provider.   Interventions: . 1:1 collaboration with BBinnie Rail MD regarding development and update of comprehensive plan of care as evidenced by provider attestation and co-signature . Inter-disciplinary care  team collaboration (see longitudinal plan of care) . Comprehensive medication review performed; medication list updated in electronic medical record  Hypertension / PAC (BP goal <140/90) -{US controlled/uncontrolled:25276} -Current treatment: . Diltiazem CD 180 mg BID . Lisinopril-HCTZ 20-12.5 MG bid . Terazosin 10 mg  HS -Medications previously tried: ***  -Current home readings: *** -Current dietary habits: *** -Current exercise habits: *** -{ACTIONS;DENIES/REPORTS:21021675::"Denies"} hypotensive/hypertensive symptoms -Educated on {CCM BP Counseling:25124} -Counseled to monitor BP at home ***, document, and provide log at future appointments -{CCMPHARMDINTERVENTION:25122}  Hyperlipidemia: (LDL goal < 100) -{US controlled/uncontrolled:25276} -Current treatment: . Atorvastatin 20 mg daily . Aspirin 81 mg daily -Medications previously tried: ***  -Current dietary patterns: *** -Current exercise habits: *** -Educated on {CCM HLD Counseling:25126} -{CCMPHARMDINTERVENTION:25122}  Diabetes (A1c goal <7%) -{US controlled/uncontrolled:25276} -Current medications: Marland Kitchen Metformin 500 mg BID . Testing supplies -Medications previously tried: ***  -Current home glucose readings . fasting glucose: *** . post prandial glucose: *** -{ACTIONS;DENIES/REPORTS:21021675::"Denies"} hypoglycemic/hyperglycemic symptoms -Current meal patterns:  . breakfast: ***  . lunch: ***  . dinner: *** . snacks: *** . drinks: *** -Current exercise: *** -Educated on {CCM DM COUNSELING:25123} -Counseled to check feet daily and get yearly eye exams -{CCMPHARMDINTERVENTION:25122}  Tobacco use (Goal ***) -{US controlled/uncontrolled:25276} -Previous quit attempts: *** -Current treatment  . *** -Patient smokes {Time to first cigarette:23873} -Patient triggers include: {Smoking Triggers:23882} -On a scale of 1-10, reports MOTIVATION to quit is *** -On a scale of 1-10, reports CONFIDENCE in quitting is  *** -{Smoking Cessation Counseling:23883} -{CCMPHARMDINTERVENTION:25122}  BPH (Goal: ***) -{US controlled/uncontrolled:25276} -Current treatment  . Tamsulosin 0.4 mg daily . Terazosin 10 mg daily -Medications previously tried: ***  -{CCMPHARMDINTERVENTION:25122}  Pain (Goal: ***) -{US controlled/uncontrolled:25276} -spinal stenosis, burning sensation in feet -Current treatment  . Gabapentin 100 mg 1-2 cap daily PRN . Meloxicam 15 mg daily -Medications previously tried: ***  -{CCMPHARMDINTERVENTION:25122}  Glaucoma (Goal: ***) -{US controlled/uncontrolled:25276} -Current treatment  . Latanoprost 0.005% eye drops HS -Medications previously tried: ***  -{CCMPHARMDINTERVENTION:25122}  Health Maintenance -Vaccine gaps: Shingrix -Current therapy:  . Klor-Con 10 mEq TID -Educated on {ccm supplement counseling:25128} -{CCM Patient satisfied:25129} -{CCMPHARMDINTERVENTION:25122}  Patient Goals/Self-Care Activities . Patient will:  - {pharmacypatientgoals:24919}  Follow Up Plan: {CM FOLLOW UP GLOV:56433}

## 2020-07-08 ENCOUNTER — Other Ambulatory Visit: Payer: Self-pay

## 2020-07-08 ENCOUNTER — Ambulatory Visit
Admission: RE | Admit: 2020-07-08 | Discharge: 2020-07-08 | Disposition: A | Discharge status: Home / Self Care | Payer: Medicare HMO | Source: Ambulatory Visit | Attending: Family Medicine | Admitting: Family Medicine

## 2020-07-08 DIAGNOSIS — M48061 Spinal stenosis, lumbar region without neurogenic claudication: Secondary | ICD-10-CM

## 2020-07-08 DIAGNOSIS — M5116 Intervertebral disc disorders with radiculopathy, lumbar region: Secondary | ICD-10-CM | POA: Diagnosis not present

## 2020-07-08 MED ORDER — IOPAMIDOL (ISOVUE-M 200) INJECTION 41%
1.0000 mL | Freq: Once | INTRAMUSCULAR | Status: AC
  Administered 2020-07-08: 1 mL via EPIDURAL

## 2020-07-08 MED ORDER — METHYLPREDNISOLONE ACETATE 40 MG/ML INJ SUSP (RADIOLOG
80.0000 mg | Freq: Once | INTRAMUSCULAR | Status: AC
  Administered 2020-07-08: 80 mg via EPIDURAL

## 2020-07-08 NOTE — Discharge Instructions (Signed)

## 2020-07-15 ENCOUNTER — Other Ambulatory Visit: Payer: Self-pay | Admitting: Internal Medicine

## 2020-07-22 ENCOUNTER — Other Ambulatory Visit: Payer: Self-pay | Admitting: Internal Medicine

## 2020-08-01 DIAGNOSIS — E119 Type 2 diabetes mellitus without complications: Secondary | ICD-10-CM | POA: Diagnosis not present

## 2020-08-01 DIAGNOSIS — H25013 Cortical age-related cataract, bilateral: Secondary | ICD-10-CM | POA: Diagnosis not present

## 2020-08-01 DIAGNOSIS — H2513 Age-related nuclear cataract, bilateral: Secondary | ICD-10-CM | POA: Diagnosis not present

## 2020-08-01 DIAGNOSIS — H401231 Low-tension glaucoma, bilateral, mild stage: Secondary | ICD-10-CM | POA: Diagnosis not present

## 2020-08-01 LAB — HM DIABETES EYE EXAM

## 2020-08-07 NOTE — Patient Instructions (Addendum)
    Blood work was ordered.      Medications changes include :   none     Please followup in 6 months  

## 2020-08-07 NOTE — Progress Notes (Signed)
Subjective:    Patient ID: Colin Love, male    DOB: February 11, 1946, 75 y.o.   MRN: [id number]  HPI The patient is here for follow up of their chronic medical problems, including DM, htn, hld, burning sensation in feet  He still smokes.  Yesterday 4-5 cig.  It can take him 3 to 4 days to go for a pack.  He does have a quit date and plans on quitting.  Some exercise, but not much.    Has intermittent abd bloating.  Not sure if it is related to certain foods, time of day.  Continues to have chronic back pain that radiates down both legs-related to spinal stenosis.  Medications and allergies reviewed with patient and updated if appropriate.  Patient Active Problem List   Diagnosis Date Noted  . Spinal stenosis of lumbar region without neurogenic claudication 01/31/2020  . Chondrocalcinosis of left knee 03/21/2019  . Sciatica of right side 08/04/2018  . Abnormal echocardiogram 05/26/2016  . Burning sensation of feet 05/26/2016  . Sudden right hearing loss 08/07/2015  . Irregular heart rhythm 08/07/2015  . Glaucoma 05/21/2015  . PAC (premature atrial contraction) 01/24/2014  . Bladder cancer ([place]) 01/09/2014  . Tobacco dependence due to cigarettes   . Diabetes type 2, controlled ([place]) 12/23/2009  . Dyslipidemia 12/23/2009  . Essential hypertension 12/23/2009  . COLONIC POLYPS, HX OF 12/23/2009  . DIVERTICULITIS, HX OF 12/23/2009    Current Outpatient Medications on File Prior to Visit  Medication Sig Dispense Refill  . ACCU-CHEK SOFTCLIX LANCETS lancets TEST UP TO 4 TIMES A DAY 300 each 1  . aspirin 81 MG tablet Take 81 mg by mouth daily.    Marland Kitchen atorvastatin (LIPITOR) 20 MG tablet TAKE 1 TABLET BY MOUTH ONCE DAILY AT 6PM 90 tablet 3  . blood glucose meter kit and supplies KIT Dispense based on patient and insurance preference. Use up to four times daily as directed. (FOR ICD-9 250.00, 250.01). 1 each 0  . diltiazem (CARDIZEM CD) 180 MG 24 hr capsule TAKE 1 CAPSULE BY MOUTH 2  TIMES A DAY 180 capsule 3  . gabapentin (NEURONTIN) 100 MG capsule Take 1-2 capsules (100-200 mg total) by mouth daily as needed. 90 capsule 3  . latanoprost (XALATAN) 0.005 % ophthalmic solution Place 1 drop into both eyes at bedtime.     Marland Kitchen lisinopril-hydrochlorothiazide (ZESTORETIC) 20-12.5 MG tablet TAKE 1 TABLET BY MOUTH 2 TIMES A DAY 180 tablet 3  . meloxicam (MOBIC) 15 MG tablet Take 15 mg by mouth daily.    . metFORMIN (GLUCOPHAGE) 500 MG tablet TAKE 1 TABLET BY MOUTH 2 TIMES DAILY WITH A MEAL. 180 tablet 1  . ONE TOUCH ULTRA TEST test strip TEST UP TO 4 TIMES DAILY 100 each 1  . potassium chloride (KLOR-CON) 10 MEQ tablet TAKE 3 TABLETS (30 MEQ TOTAL) BY MOUTH DAILY. 270 tablet 1  . tamsulosin (FLOMAX) 0.4 MG CAPS capsule Take 0.4 mg by mouth daily.    Marland Kitchen terazosin (HYTRIN) 10 MG capsule TAKE 1 CAPSULE BY MOUTH EVERYDAY AT BEDTIME 90 capsule 1   No current facility-administered medications on file prior to visit.    Past Medical History:  Diagnosis Date  . Adenoma of left adrenal gland   . At risk for sleep apnea    STOP-BANG= 4    SENT TO PCP 02-06-2014  . Bladder cancer ([place]) 02/2014   local, low grade pap uro carcinoma  . BPH (benign prostatic hypertrophy)   .  Diverticulosis of colon   . Full dentures   . GERD (gastroesophageal reflux disease)   . History of adenomatous polyp of colon   . History of diverticulitis of colon   . Hyperlipidemia   . Hypertension   . Murmur, cardiac   . PAC (premature atrial contraction)   . Type 2 diabetes mellitus ([place])     Past Surgical History:  Procedure Laterality Date  . ANTERIOR CERVICAL DECOMP/DISCECTOMY FUSION  05-24-2003   C4  --  C7  . COLONOSCOPY W/ POLYPECTOMY  last one 07-24-2013 w/ EGD  . CYSTOSCOPY W/ RETROGRADES Bilateral 02/09/2014   Procedure: BILATERAL RETROGRADE PYELOGRAM , Cystoscopy;  Surgeon: Ardis Hughs, MD;  Location: [facility];  Service: Urology;  Laterality: Bilateral;  . TRANSURETHRAL  RESECTION OF BLADDER TUMOR WITH GYRUS (TURBT-GYRUS) N/A 02/09/2014   Procedure: Bladder Biopsies;  Surgeon: Ardis Hughs, MD;  Location: [facility];  Service: Urology;  Laterality: N/A;    Social History   Socioeconomic History  . Marital status: Married    Spouse name: Not on file  . Number of children: Not on file  . Years of education: Not on file  . Highest education level: Not on file  Occupational History  . Not on file  Tobacco Use  . Smoking status: Current Every Day Smoker    Packs/day: 0.50    Years: 40.00    Pack years: 20.00    Types: Cigarettes  . Smokeless tobacco: Never Used  Vaping Use  . Vaping Use: Never used  Substance and Sexual Activity  . Alcohol use: Yes    Comment: OCCASIONAL  . Drug use: No  . Sexual activity: Not Currently  Other Topics Concern  . Not on file  Social History Narrative  . Not on file   Social Determinants of Health   Financial Resource Strain: Not on file  Food Insecurity: Not on file  Transportation Needs: Not on file  Physical Activity: Not on file  Stress: Not on file  Social Connections: Not on file    Family History  Problem Relation Age of Onset  . Colon cancer Mother   . Hypertension Mother   . Hyperlipidemia Mother   . Hypertension Father   . Hyperlipidemia Father   . Diabetes Brother     Review of Systems  Constitutional: Negative for chills and fever.  Respiratory: Negative for cough, shortness of breath and wheezing.   Cardiovascular: Negative for chest pain, palpitations and leg swelling.  Gastrointestinal: Positive for abdominal distention (bloating). Negative for abdominal pain, blood in stool, constipation, diarrhea and nausea.       Rare gerd  Genitourinary: Negative for difficulty urinating.  Neurological: Negative for light-headedness and headaches.       Objective:   Vitals:   08/08/20 0939  BP: 122/70  Pulse: 64  Temp: 97.9 F (36.6 C)  SpO2: 98%   BP Readings  from Last 3 Encounters:  08/08/20 122/70  07/08/20 132/88  02/29/20 134/84   Wt Readings from Last 3 Encounters:  08/08/20 191 lb 12.8 oz (87 kg)  02/07/20 190 lb 12.8 oz (86.5 kg)  01/31/20 191 lb 6.4 oz (86.8 kg)   Body mass index is 30.04 kg/m.   Physical Exam    Constitutional: Appears well-developed and well-nourished. No distress.  HENT:  Head: Normocephalic and atraumatic.  Neck: Neck supple. No tracheal deviation present. No thyromegaly present.  No cervical lymphadenopathy Cardiovascular: Normal rate, regular rhythm and normal heart  sounds.   No murmur heard. No carotid bruit .  No edema Pulmonary/Chest: Effort normal and breath sounds normal. No respiratory distress. No has no wheezes. No rales.  Abd: soft, NT, ND Skin: Skin is warm and dry. Not diaphoretic.  Psychiatric: Normal mood and affect. Behavior is normal.      Assessment & Plan:    See Problem List for Assessment and Plan of chronic medical problems.    This visit occurred during the SARS-CoV-2 public health emergency.  Safety protocols were in place, including screening questions prior to the visit, additional usage of staff PPE, and extensive cleaning of exam room while observing appropriate contact time as indicated for disinfecting solutions.

## 2020-08-08 ENCOUNTER — Ambulatory Visit (INDEPENDENT_AMBULATORY_CARE_PROVIDER_SITE_OTHER): Payer: Medicare HMO | Admitting: Internal Medicine

## 2020-08-08 ENCOUNTER — Encounter: Payer: Self-pay | Admitting: Internal Medicine

## 2020-08-08 ENCOUNTER — Other Ambulatory Visit: Payer: Self-pay

## 2020-08-08 ENCOUNTER — Other Ambulatory Visit: Payer: Self-pay | Admitting: Internal Medicine

## 2020-08-08 VITALS — BP 122/70 | HR 64 | Temp 97.9°F | Ht 67.0 in | Wt 191.8 lb

## 2020-08-08 DIAGNOSIS — E118 Type 2 diabetes mellitus with unspecified complications: Secondary | ICD-10-CM | POA: Diagnosis not present

## 2020-08-08 DIAGNOSIS — F17210 Nicotine dependence, cigarettes, uncomplicated: Secondary | ICD-10-CM

## 2020-08-08 DIAGNOSIS — N400 Enlarged prostate without lower urinary tract symptoms: Secondary | ICD-10-CM | POA: Diagnosis not present

## 2020-08-08 DIAGNOSIS — R69 Illness, unspecified: Secondary | ICD-10-CM | POA: Diagnosis not present

## 2020-08-08 DIAGNOSIS — I10 Essential (primary) hypertension: Secondary | ICD-10-CM

## 2020-08-08 DIAGNOSIS — M48061 Spinal stenosis, lumbar region without neurogenic claudication: Secondary | ICD-10-CM

## 2020-08-08 DIAGNOSIS — E785 Hyperlipidemia, unspecified: Secondary | ICD-10-CM

## 2020-08-08 LAB — HEMOGLOBIN A1C: Hgb A1c MFr Bld: 6.9 % — ABNORMAL HIGH (ref 4.6–6.5)

## 2020-08-08 NOTE — Assessment & Plan Note (Signed)
Chronic Continue metformin 500 mg BID  Check a1c Encouraged walking regularly Continue diabetic diet

## 2020-08-08 NOTE — Assessment & Plan Note (Signed)
Chronic Sees urology On flomax and hytrin No obstructive symptoms Continue above meds

## 2020-08-08 NOTE — Assessment & Plan Note (Signed)
Chronic Check lipid panel  Continue atorvastatin 20 mg daily Regular exercise and healthy diet encouraged  

## 2020-08-08 NOTE — Assessment & Plan Note (Signed)
Chronic BP well controlled Continue cardizem 180 mg bid, lisinopril-hct 20-12.5 mg bid cmp

## 2020-08-08 NOTE — Assessment & Plan Note (Addendum)
Chronic Chronic back pain radiates down legs - b/l anterior legs to knees Has neuropathy in both feet Gets intermittent injections and it helps

## 2020-08-08 NOTE — Assessment & Plan Note (Signed)
Chronic Actively working on quitting-he does have a quit date

## 2020-08-09 LAB — COMPREHENSIVE METABOLIC PANEL WITH GFR
ALT: 17 U/L (ref 0–53)
AST: 17 U/L (ref 0–37)
Albumin: 4.2 g/dL (ref 3.5–5.2)
Alkaline Phosphatase: 64 U/L (ref 39–117)
BUN: 20 mg/dL (ref 6–23)
CO2: 28 meq/L (ref 19–32)
Calcium: 10 mg/dL (ref 8.4–10.5)
Chloride: 103 meq/L (ref 96–112)
Creatinine, Ser: 1.02 mg/dL (ref 0.40–1.50)
GFR: 71.82 mL/min
Glucose, Bld: 96 mg/dL (ref 70–99)
Potassium: 4.1 meq/L (ref 3.5–5.1)
Sodium: 140 meq/L (ref 135–145)
Total Bilirubin: 0.6 mg/dL (ref 0.2–1.2)
Total Protein: 6.9 g/dL (ref 6.0–8.3)

## 2020-08-09 LAB — LIPID PANEL
Cholesterol: 109 mg/dL (ref 0–200)
HDL: 49.5 mg/dL
LDL Cholesterol: 47 mg/dL (ref 0–99)
NonHDL: 59.63
Total CHOL/HDL Ratio: 2
Triglycerides: 62 mg/dL (ref 0.0–149.0)
VLDL: 12.4 mg/dL (ref 0.0–40.0)

## 2020-08-23 ENCOUNTER — Encounter: Payer: Self-pay | Admitting: Internal Medicine

## 2020-08-23 NOTE — Progress Notes (Unsigned)
Outside notes received. Information abstracted. Notes sent to scan.  

## 2020-09-13 ENCOUNTER — Telehealth: Payer: Self-pay | Admitting: Pharmacist

## 2020-09-13 NOTE — Progress Notes (Signed)
    Chronic Care Management Pharmacy Assistant   Name: Colin Love  MRN: [id number] DOB: Jul 17, 1945  Reason for Encounter: Disease State - General Adherence   Recent office visits:  08/08/20 Quay Burow (PCP) - Hypertension. No med changes.  Recent consult visits:  None noted  Hospital visits:  None in previous 6 months  Medications: Outpatient Encounter Medications as of 09/13/2020  Medication Sig   ACCU-CHEK SOFTCLIX LANCETS lancets TEST UP TO 4 TIMES A DAY   aspirin 81 MG tablet Take 81 mg by mouth daily.   atorvastatin (LIPITOR) 20 MG tablet TAKE 1 TABLET BY MOUTH ONCE DAILY AT 6PM   blood glucose meter kit and supplies KIT Dispense based on patient and insurance preference. Use up to four times daily as directed. (FOR ICD-9 250.00, 250.01).   diltiazem (CARDIZEM CD) 180 MG 24 hr capsule TAKE 1 CAPSULE BY MOUTH 2 TIMES A DAY   gabapentin (NEURONTIN) 100 MG capsule Take 1-2 capsules (100-200 mg total) by mouth daily as needed.   latanoprost (XALATAN) 0.005 % ophthalmic solution Place 1 drop into both eyes at bedtime.    lisinopril-hydrochlorothiazide (ZESTORETIC) 20-12.5 MG tablet TAKE 1 TABLET BY MOUTH 2 TIMES A DAY   meloxicam (MOBIC) 15 MG tablet Take 15 mg by mouth daily.   metFORMIN (GLUCOPHAGE) 500 MG tablet TAKE 1 TABLET BY MOUTH 2 TIMES DAILY WITH A MEAL.   ONE TOUCH ULTRA TEST test strip TEST UP TO 4 TIMES DAILY   potassium chloride (KLOR-CON) 10 MEQ tablet TAKE 3 TABLETS (30 MEQ TOTAL) BY MOUTH DAILY.   tamsulosin (FLOMAX) 0.4 MG CAPS capsule Take 0.4 mg by mouth daily.   terazosin (HYTRIN) 10 MG capsule TAKE 1 CAPSULE BY MOUTH EVERYDAY AT BEDTIME   No facility-administered encounter medications on file as of 09/13/2020.    Have you had any problems recently with your health? Patient states his back and knees still bother him but the injection he has been getting helps him a lot.  Have you had any problems with your pharmacy? Patient states no problems with pharmacy.    What issues or side effects are you having with your medications? Patient states no side effects or issues.   What would you like me to pass along to [facility] for them to help you with?  Patient states nothing at this time.   What can we do to take care of you better? Patient states we are doing a good job with checking up on him.   Star Rating Drugs: Atorvastatin - last fill 08/08/20 90D Lisinopril-hctz - last fill 08/08/20 90D Metformin - last fill 07/22/20 [place], RMA Clinical Pharmacists Assistant [phone]  Time Spent: 51

## 2020-10-10 ENCOUNTER — Other Ambulatory Visit: Payer: Self-pay

## 2020-10-10 ENCOUNTER — Ambulatory Visit (INDEPENDENT_AMBULATORY_CARE_PROVIDER_SITE_OTHER): Payer: Medicare HMO

## 2020-10-10 VITALS — BP 120/70 | HR 58 | Temp 98.2°F | Ht 67.0 in | Wt 189.6 lb

## 2020-10-10 DIAGNOSIS — Z0000 Encounter for general adult medical examination without abnormal findings: Secondary | ICD-10-CM | POA: Diagnosis not present

## 2020-10-10 NOTE — Progress Notes (Signed)
 Subjective:   Colin Love is a 74 y.o. male who presents for Medicare Annual/Subsequent preventive examination.  Review of Systems     Cardiac Risk Factors include: advanced age (>55men, >65 women);diabetes mellitus;dyslipidemia;hypertension;male gender;smoking/ tobacco exposure;family history of premature cardiovascular disease     Objective:    Today's Vitals   10/10/20 1003  BP: 120/70  Pulse: (!) 58  Temp: 98.2 F (36.8 C)  SpO2: 98%  Weight: 189 lb 9.6 oz (86 kg)  Height: 5' 7" (1.702 m)  PainSc: 0-No pain   Body mass index is 29.7 kg/m.  Advanced Directives 10/10/2020 03/16/2019 05/27/2017 11/26/2015 08/29/2015 02/09/2014  Does Patient Have a Medical Advance Directive? Yes Yes Yes Yes No;Yes Yes  Type of Advance Directive Living will;Healthcare Power of Attorney Living will Healthcare Power of Attorney;Living will - - Living will  Does patient want to make changes to medical advance directive? No - Patient declined No - Patient declined - - - No - Patient declined  Copy of Healthcare Power of Attorney in Chart? No - copy requested - No - copy requested - - No - copy requested  Would patient like information on creating a medical advance directive? - No - Patient declined - - No - patient declined information -    Current Medications (verified) Outpatient Encounter Medications as of 10/10/2020  Medication Sig   ACCU-CHEK SOFTCLIX LANCETS lancets TEST UP TO 4 TIMES A DAY   aspirin 81 MG tablet Take 81 mg by mouth daily.   atorvastatin (LIPITOR) 20 MG tablet TAKE 1 TABLET BY MOUTH ONCE DAILY AT 6PM   blood glucose meter kit and supplies KIT Dispense based on patient and insurance preference. Use up to four times daily as directed. (FOR ICD-9 250.00, 250.01).   diltiazem (CARDIZEM CD) 180 MG 24 hr capsule TAKE 1 CAPSULE BY MOUTH 2 TIMES A DAY   gabapentin (NEURONTIN) 100 MG capsule Take 1-2 capsules (100-200 mg total) by mouth daily as needed.   latanoprost (XALATAN) 0.005 %  ophthalmic solution Place 1 drop into both eyes at bedtime.    lisinopril-hydrochlorothiazide (ZESTORETIC) 20-12.5 MG tablet TAKE 1 TABLET BY MOUTH 2 TIMES A DAY   meloxicam (MOBIC) 15 MG tablet Take 15 mg by mouth daily.   metFORMIN (GLUCOPHAGE) 500 MG tablet TAKE 1 TABLET BY MOUTH 2 TIMES DAILY WITH A MEAL.   ONE TOUCH ULTRA TEST test strip TEST UP TO 4 TIMES DAILY   potassium chloride (KLOR-CON) 10 MEQ tablet TAKE 3 TABLETS (30 MEQ TOTAL) BY MOUTH DAILY.   tamsulosin (FLOMAX) 0.4 MG CAPS capsule Take 0.4 mg by mouth daily.   terazosin (HYTRIN) 10 MG capsule TAKE 1 CAPSULE BY MOUTH EVERYDAY AT BEDTIME   No facility-administered encounter medications on file as of 10/10/2020.    Allergies (verified) Patient has no known allergies.   History: Past Medical History:  Diagnosis Date   Adenoma of left adrenal gland    At risk for sleep apnea    STOP-BANG= 4    SENT TO PCP 02-06-2014   Bladder cancer (HCC) 02/2014   local, low grade pap uro carcinoma   BPH (benign prostatic hypertrophy)    Diverticulosis of colon    Full dentures    GERD (gastroesophageal reflux disease)    History of adenomatous polyp of colon    History of diverticulitis of colon    Hyperlipidemia    Hypertension    Murmur, cardiac    PAC (premature atrial contraction)      Type 2 diabetes mellitus [facility])    Past Surgical History:  Procedure Laterality Date   ANTERIOR CERVICAL DECOMP/DISCECTOMY FUSION  05-24-2003   C4  --  C7   COLONOSCOPY W/ POLYPECTOMY  last one 07-24-2013 w/ EGD   CYSTOSCOPY W/ RETROGRADES Bilateral 02/09/2014   Procedure: BILATERAL RETROGRADE PYELOGRAM , Cystoscopy;  Surgeon: Ardis Hughs, MD;  Location: [facility];  Service: Urology;  Laterality: Bilateral;   TRANSURETHRAL RESECTION OF BLADDER TUMOR WITH GYRUS (TURBT-GYRUS) N/A 02/09/2014   Procedure: Bladder Biopsies;  Surgeon: Ardis Hughs, MD;  Location: [facility];  Service: Urology;  Laterality:  N/A;   Family History  Problem Relation Age of Onset   Colon cancer Mother    Hypertension Mother    Hyperlipidemia Mother    Hypertension Father    Hyperlipidemia Father    Diabetes Brother    Social History   Socioeconomic History   Marital status: Married    Spouse name: Not on file   Number of children: Not on file   Years of education: Not on file   Highest education level: Not on file  Occupational History   Not on file  Tobacco Use   Smoking status: Every Day    Packs/day: 0.50    Years: 40.00    Pack years: 20.00    Types: Cigarettes   Smokeless tobacco: Never  Vaping Use   Vaping Use: Never used  Substance and Sexual Activity   Alcohol use: Yes    Comment: OCCASIONAL   Drug use: No   Sexual activity: Not Currently  Other Topics Concern   Not on file  Social History Narrative   Not on file   Social Determinants of Health   Financial Resource Strain: Low Risk    Difficulty of Paying Living Expenses: Not hard at all  Food Insecurity: No Food Insecurity   Worried About Charity fundraiser in the Last Year: Never true   Atkins in the Last Year: Never true  Transportation Needs: No Transportation Needs   Lack of Transportation (Medical): No   Lack of Transportation (Non-Medical): No  Physical Activity: Sufficiently Active   Days of Exercise per Week: 1 day   Minutes of Exercise per Session: 150+ min  Stress: No Stress Concern Present   Feeling of Stress : Not at all  Social Connections: Socially Integrated   Frequency of Communication with Friends and Family: More than three times a week   Frequency of Social Gatherings with Friends and Family: More than three times a week   Attends Religious Services: More than 4 times per year   Active Member of [employer] or Organizations: Yes   Attends Music therapist: More than 4 times per year   Marital Status: Married    Tobacco Counseling Ready to quit: Not Answered Counseling given: Not  Answered   Clinical Intake:  Pre-visit preparation completed: Yes  Pain : No/denies pain Pain Score: 0-No pain     BMI - recorded: 29.7 Nutritional Status: BMI 25 -29 Overweight Nutritional Risks: None Diabetes: Yes CBG done?: No Did pt. bring in CBG monitor from home?: No  How often do you need to have someone help you when you read instructions, pamphlets, or other written materials from your doctor or pharmacy?: 1 - Never What is the last grade level you completed in school?: High School Graduate  Diabetic? yes  Interpreter Needed?: No  Information entered by :: Lisette Abu,  LPN   Activities of Daily Living In your present state of health, do you have any difficulty performing the following activities: 10/10/2020 02/07/2020  Hearing? Y N  Vision? N N  Difficulty concentrating or making decisions? N N  Walking or climbing stairs? N N  Dressing or bathing? N N  Doing errands, shopping? N N  Preparing Food and eating ? N -  Using the Toilet? N -  In the past six months, have you accidently leaked urine? N -  Do you have problems with loss of bowel control? N -  Managing your Medications? N -  Managing your Finances? N -  Housekeeping or managing your Housekeeping? N -  Some recent data might be hidden    Patient Care Team: Burns, Stacy J, MD as PCP - General (Internal Medicine) Mann, Jyothi, MD as Consulting Physician (Gastroenterology) Herrick, Benjamin W, MD (Urology) Skains, Mark C, MD (Cardiology) Foltanski, Lindsey N, RPH as Pharmacist (Pharmacist) Lyles, Graham, MD as Consulting Physician (Ophthalmology)  Indicate any recent Medical Services you may have received from other than [facility] providers in the past year (date may be approximate).     Assessment:   This is a routine wellness examination for Colin Love.  Hearing/Vision screen Hearing Screening - Comments:: Patient has right ear hearing loss.  Use to wear hearing aids.  Need to be re-evaluated by  ENT. Vision Screening - Comments:: Patient is seen every 6 months by Dr. Graham Lyles for diabetic retinopathy.  Dietary issues and exercise activities discussed: Current Exercise Habits: The patient has a physically strenuous job, but has no regular exercise apart from work., Exercise limited by: orthopedic condition(s)   Goals Addressed             This Visit's Progress    Quit smoking / using tobacco   On track    Smoking;  Educated to avoid secondary smoke Smoking cessation in [place]: [employer] Line at [phone] -day  Classes offered a couple of times a month;  Will work with the patient as far as registration and location  Meds may help; chatix (Varenicline); Zyban (Bupropion SR); Nicotine Replacement (gum; lozenges; patches; etc.)   30 pack yr smoking hx: Educated regarding LDCT; To discuss with MD at next fup. Also educated on AAA screening for men 65-75 who have smoked       Depression Screen PHQ 2/9 Scores 10/10/2020 02/07/2020 02/07/2019 08/04/2018 05/27/2017 11/26/2016 11/26/2015  PHQ - 2 Score 0 0 0 0 1 0 0  PHQ- 9 Score - - - - 1 - -    Fall Risk Fall Risk  10/10/2020 02/07/2020 08/08/2019 02/07/2019 08/04/2018  Falls in the past year? 0 0 - 0 1  Number falls in past yr: 0 0 0 0 0  Injury with Fall? 0 0 0 - 0  Risk for fall due to : No Fall Risks No Fall Risks - - -  Follow up Falls evaluation completed Falls evaluation completed Falls evaluation completed - -    FALL RISK PREVENTION PERTAINING TO THE HOME:  Any stairs in or around the home? No  If so, are there any without handrails? No  Home free of loose throw rugs in walkways, pet beds, electrical cords, etc? Yes  Adequate lighting in your home to reduce risk of falls? Yes   ASSISTIVE DEVICES UTILIZED TO PREVENT FALLS:  Life alert? No  Use of a cane, walker or w/c? No  Grab bars in the bathroom? Yes  Shower chair or   bench in shower? No  Elevated toilet seat or a handicapped toilet? No   TIMED UP AND  GO:  Was the test performed? Yes .  Length of time to ambulate 10 feet: 6 sec.   Gait steady and fast without use of assistive device  Cognitive Function: Normal cognitive status assessed by direct observation by this Nurse Health Advisor. No abnormalities found.   MMSE - Mini Mental State Exam 05/27/2017 11/26/2015  Not completed: - (No Data)  Orientation to time 5 -  Orientation to Place 5 -  Registration 3 -  Attention/ Calculation 5 -  Recall 3 -  Language- name 2 objects 2 -  Language- repeat 1 -  Language- follow 3 step command 3 -  Language- read & follow direction 1 -  Write a sentence 1 -  Copy design 1 -  Total score 30 -        Immunizations Immunization History  Administered Date(s) Administered   Fluad Quad(high Dose 65+) 02/07/2019, 02/07/2020   Influenza Split 03/20/2011   Influenza, High Dose Seasonal PF 01/30/2013, 01/09/2014, 11/13/2014, 11/26/2015, 11/26/2016, 11/29/2017   Influenza, Seasonal, Injecte, Preservative Fre 03/17/2012   PFIZER(Purple Top)SARS-COV-2 Vaccination 04/20/2019, 05/15/2019, 02/06/2020   Pneumococcal Conjugate-13 11/13/2014   Pneumococcal Polysaccharide-23 03/20/2011   Tdap 07/02/2010    TDAP status: Due, Education has been provided regarding the importance of this vaccine. Advised may receive this vaccine at local pharmacy or Health Dept. Aware to provide a copy of the vaccination record if obtained from local pharmacy or Health Dept. Verbalized acceptance and understanding.  Flu Vaccine status: Up to date  Pneumococcal vaccine status: Up to date  Covid-19 vaccine status: Completed vaccines  Qualifies for Shingles Vaccine? Yes   Zostavax completed No   Shingrix Completed?: No.    Education has been provided regarding the importance of this vaccine. Patient has been advised to call insurance company to determine out of pocket expense if they have not yet received this vaccine. Advised may also receive vaccine at local pharmacy or  Health Dept. Verbalized acceptance and understanding.  Screening Tests Health Maintenance  Topic Date Due   FOOT EXAM  08/04/2019   COVID-19 Vaccine (4 - Booster for Pfizer series) 05/06/2020   INFLUENZA VACCINE  10/07/2020   Zoster Vaccines- Shingrix (1 of 2) 11/08/2020 (Originally 12/12/1964)   TETANUS/TDAP  08/08/2021 (Originally 07/01/2020)   HEMOGLOBIN A1C  02/07/2021   OPHTHALMOLOGY EXAM  08/01/2021   COLONOSCOPY (Pts 45-49yrs Insurance coverage will need to be confirmed)  11/20/2028   Hepatitis C Screening  Completed   PNA vac Low Risk Adult  Completed   HPV VACCINES  Aged Out    Health Maintenance  Health Maintenance Due  Topic Date Due   FOOT EXAM  08/04/2019   COVID-19 Vaccine (4 - Booster for Pfizer series) 05/06/2020   INFLUENZA VACCINE  10/07/2020    Colorectal cancer screening: No longer required.   Lung Cancer Screening: (Low Dose CT Chest recommended if Age 55-80 years, 30 pack-year currently smoking OR have quit w/in 15years.) does qualify.   Lung Cancer Screening Referral: no  Additional Screening:  Hepatitis C Screening: does qualify; Completed yes  Vision Screening: Recommended annual ophthalmology exams for early detection of glaucoma and other disorders of the eye. Is the patient up to date with their annual eye exam?  Yes  Who is the provider or what is the name of the office in which the patient attends annual eye exams? Graham Lyles, MD. If pt   is not established with a provider, would they like to be referred to a provider to establish care? No .   Dental Screening: Recommended annual dental exams for proper oral hygiene  Community Resource Referral / Chronic Care Management: CRR required this visit?  No   CCM required this visit?  No      Plan:     I have personally reviewed and noted the following in the patient's chart:   Medical and social history Use of alcohol, tobacco or illicit drugs  Current medications and supplements  including opioid prescriptions. Patient is not currently taking opioid prescriptions. Functional ability and status Nutritional status Physical activity Advanced directives List of other physicians Hospitalizations, surgeries, and ER visits in previous 12 months Vitals Screenings to include cognitive, depression, and falls Referrals and appointments  In addition, I have reviewed and discussed with patient certain preventive protocols, quality metrics, and best practice recommendations. A written personalized care plan for preventive services as well as general preventive health recommendations were provided to patient.     Sheral Flow, LPN   03/14/1094   Nurse Notes: n/a

## 2020-10-10 NOTE — Patient Instructions (Addendum)
Colin Love , Thank you for taking time to come for your Medicare Wellness Visit. I appreciate your ongoing commitment to your health goals. Please review the following plan we discussed and let me know if I can assist you in the future.   Screening recommendations/referrals: Colonoscopy: last done 11/21/2018; not recommended due age  Recommended yearly ophthalmology/optometry visit for glaucoma screening and checkup Recommended yearly dental visit for hygiene and checkup  Vaccinations: Influenza vaccine: 02/07/2020 Pneumococcal vaccine: 03/20/2011, 11/13/2014 Tdap vaccine: 07/02/2010; due every 10 years Shingles vaccine:  Please call your insurance company to determine your out of pocket expense for the Shingrix vaccine. You may receive this vaccine at your local pharmacy.  Covid-19: 04/20/2019, 05/15/2019, 02/06/2020  Advanced directives: Please bring a copy of your health care power of attorney and living will to the office at your convenience.  Conditions/risks identified: Yes; Client understands the importance of follow-up with providers by attending scheduled visits and discussed goals to eat healthier, increase physical activity, exercise the brain, socialize more, get enough sleep and make time for laughter.  Next appointment: Please schedule your next Medicare Wellness Visit with your Nurse Health Advisor in 1 year by calling [phone].  Preventive Care 17 Years and Older, Male Preventive care refers to lifestyle choices and visits with your health care provider that can promote health and wellness. What does preventive care include? A yearly physical exam. This is also called an annual well check. Dental exams once or twice a year. Routine eye exams. Ask your health care provider how often you should have your eyes checked. Personal lifestyle choices, including: Daily care of your teeth and gums. Regular physical activity. Eating a healthy diet. Avoiding tobacco and drug  use. Limiting alcohol use. Practicing safe sex. Taking low doses of aspirin every day. Taking vitamin and mineral supplements as recommended by your health care provider. What happens during an annual well check? The services and screenings done by your health care provider during your annual well check will depend on your age, overall health, lifestyle risk factors, and family history of disease. Counseling  Your health care provider may ask you questions about your: Alcohol use. Tobacco use. Drug use. Emotional well-being. Home and relationship well-being. Sexual activity. Eating habits. History of falls. Memory and ability to understand (cognition). Work and work Statistician. Screening  You may have the following tests or measurements: Height, weight, and BMI. Blood pressure. Lipid and cholesterol levels. These may be checked every 5 years, or more frequently if you are over 75 years old. Skin check. Lung cancer screening. You may have this screening every year starting at age 85 if you have a 30-pack-year history of smoking and currently smoke or have quit within the past 15 years. Fecal occult blood test (FOBT) of the stool. You may have this test every year starting at age 86. Flexible sigmoidoscopy or colonoscopy. You may have a sigmoidoscopy every 5 years or a colonoscopy every 10 years starting at age 8. Prostate cancer screening. Recommendations will vary depending on your family history and other risks. Hepatitis C blood test. Hepatitis B blood test. Sexually transmitted disease (STD) testing. Diabetes screening. This is done by checking your blood sugar (glucose) after you have not eaten for a while (fasting). You may have this done every 1-3 years. Abdominal aortic aneurysm (AAA) screening. You may need this if you are a current or former smoker. Osteoporosis. You may be screened starting at age 41 if you are at high risk. Talk with  your health care provider about  your test results, treatment options, and if necessary, the need for more tests. Vaccines  Your health care provider may recommend certain vaccines, such as: Influenza vaccine. This is recommended every year. Tetanus, diphtheria, and acellular pertussis (Tdap, Td) vaccine. You may need a Td booster every 10 years. Zoster vaccine. You may need this after age 89. Pneumococcal 13-valent conjugate (PCV13) vaccine. One dose is recommended after age 25. Pneumococcal polysaccharide (PPSV23) vaccine. One dose is recommended after age 14. Talk to your health care provider about which screenings and vaccines you need and how often you need them. This information is not intended to replace advice given to you by your health care provider. Make sure you discuss any questions you have with your health care provider. Document Released: 03/22/2015 Document Revised: 11/13/2015 Document Reviewed: 12/25/2014 Elsevier Interactive Patient Education  2017 [place] Prevention in the Home Falls can cause injuries. They can happen to people of all ages. There are many things you can do to make your home safe and to help prevent falls. What can I do on the outside of my home? Regularly fix the edges of walkways and driveways and fix any cracks. Remove anything that might make you trip as you walk through a door, such as a raised step or threshold. Trim any bushes or trees on the path to your home. Use bright outdoor lighting. Clear any walking paths of anything that might make someone trip, such as rocks or tools. Regularly check to see if handrails are loose or broken. Make sure that both sides of any steps have handrails. Any raised decks and porches should have guardrails on the edges. Have any leaves, snow, or ice cleared regularly. Use sand or salt on walking paths during winter. Clean up any spills in your garage right away. This includes oil or grease spills. What can I do in the bathroom? Use  night lights. Install grab bars by the toilet and in the tub and shower. Do not use towel bars as grab bars. Use non-skid mats or decals in the tub or shower. If you need to sit down in the shower, use a plastic, non-slip stool. Keep the floor dry. Clean up any water that spills on the floor as soon as it happens. Remove soap buildup in the tub or shower regularly. Attach bath mats securely with double-sided non-slip rug tape. Do not have throw rugs and other things on the floor that can make you trip. What can I do in the bedroom? Use night lights. Make sure that you have a light by your bed that is easy to reach. Do not use any sheets or blankets that are too big for your bed. They should not hang down onto the floor. Have a firm chair that has side arms. You can use this for support while you get dressed. Do not have throw rugs and other things on the floor that can make you trip. What can I do in the kitchen? Clean up any spills right away. Avoid walking on wet floors. Keep items that you use a lot in easy-to-reach places. If you need to reach something above you, use a strong step stool that has a grab bar. Keep electrical cords out of the way. Do not use floor polish or wax that makes floors slippery. If you must use wax, use non-skid floor wax. Do not have throw rugs and other things on the floor that can make you trip.  What can I do with my stairs? Do not leave any items on the stairs. Make sure that there are handrails on both sides of the stairs and use them. Fix handrails that are broken or loose. Make sure that handrails are as long as the stairways. Check any carpeting to make sure that it is firmly attached to the stairs. Fix any carpet that is loose or worn. Avoid having throw rugs at the top or bottom of the stairs. If you do have throw rugs, attach them to the floor with carpet tape. Make sure that you have a light switch at the top of the stairs and the bottom of the  stairs. If you do not have them, ask someone to add them for you. What else can I do to help prevent falls? Wear shoes that: Do not have high heels. Have rubber bottoms. Are comfortable and fit you well. Are closed at the toe. Do not wear sandals. If you use a stepladder: Make sure that it is fully opened. Do not climb a closed stepladder. Make sure that both sides of the stepladder are locked into place. Ask someone to hold it for you, if possible. Clearly mark and make sure that you can see: Any grab bars or handrails. First and last steps. Where the edge of each step is. Use tools that help you move around (mobility aids) if they are needed. These include: Canes. Walkers. Scooters. Crutches. Turn on the lights when you go into a dark area. Replace any light bulbs as soon as they burn out. Set up your furniture so you have a clear path. Avoid moving your furniture around. If any of your floors are uneven, fix them. If there are any pets around you, be aware of where they are. Review your medicines with your doctor. Some medicines can make you feel dizzy. This can increase your chance of falling. Ask your doctor what other things that you can do to help prevent falls. This information is not intended to replace advice given to you by your health care provider. Make sure you discuss any questions you have with your health care provider. Document Released: 12/20/2008 Document Revised: 08/01/2015 Document Reviewed: 03/30/2014 Elsevier Interactive Patient Education  2017 [employer].

## 2020-10-11 ENCOUNTER — Telehealth: Payer: Self-pay | Admitting: Family Medicine

## 2020-10-11 DIAGNOSIS — M48061 Spinal stenosis, lumbar region without neurogenic claudication: Secondary | ICD-10-CM

## 2020-10-11 DIAGNOSIS — R29898 Other symptoms and signs involving the musculoskeletal system: Secondary | ICD-10-CM

## 2020-10-11 NOTE — Telephone Encounter (Signed)
Pt called stating it was time for another back injection ( epidural ). Noted he has not been seen since 01/2020 and attempted to schedule f/u appt.  Pt wanted to know if we could order epidural without another visit here.

## 2020-10-14 NOTE — Telephone Encounter (Signed)
Pt notified that injection ordered, he will reach out to [facility] this week to schedule.

## 2020-10-14 NOTE — Telephone Encounter (Signed)
Injection ordered.  Call [facility] to schedule [phone]

## 2020-10-21 ENCOUNTER — Other Ambulatory Visit: Payer: Self-pay

## 2020-10-21 ENCOUNTER — Ambulatory Visit
Admission: RE | Admit: 2020-10-21 | Discharge: 2020-10-21 | Disposition: A | Discharge status: Home / Self Care | Payer: Medicare HMO | Source: Ambulatory Visit | Attending: Family Medicine | Admitting: Family Medicine

## 2020-10-21 DIAGNOSIS — R29898 Other symptoms and signs involving the musculoskeletal system: Secondary | ICD-10-CM

## 2020-10-21 DIAGNOSIS — M47817 Spondylosis without myelopathy or radiculopathy, lumbosacral region: Secondary | ICD-10-CM | POA: Diagnosis not present

## 2020-10-21 DIAGNOSIS — M48061 Spinal stenosis, lumbar region without neurogenic claudication: Secondary | ICD-10-CM

## 2020-10-21 MED ORDER — METHYLPREDNISOLONE ACETATE 40 MG/ML INJ SUSP (RADIOLOG
80.0000 mg | Freq: Once | INTRAMUSCULAR | Status: AC
  Administered 2020-10-21: 80 mg via EPIDURAL

## 2020-10-21 MED ORDER — IOPAMIDOL (ISOVUE-M 200) INJECTION 41%
1.0000 mL | Freq: Once | INTRAMUSCULAR | Status: AC
  Administered 2020-10-21: 1 mL via EPIDURAL

## 2020-10-21 NOTE — Discharge Instructions (Signed)

## 2020-11-10 ENCOUNTER — Other Ambulatory Visit: Payer: Self-pay | Admitting: Internal Medicine

## 2021-01-11 ENCOUNTER — Other Ambulatory Visit: Payer: Self-pay | Admitting: Internal Medicine

## 2021-02-03 DIAGNOSIS — H401131 Primary open-angle glaucoma, bilateral, mild stage: Secondary | ICD-10-CM | POA: Diagnosis not present

## 2021-02-10 ENCOUNTER — Encounter: Payer: Self-pay | Admitting: Internal Medicine

## 2021-02-10 NOTE — Patient Instructions (Addendum)
    Blood work was ordered.     An EKG was done today.   Flu immunization administered today.    Medications changes include :   none  Your prescription(s) have been submitted to your pharmacy. Please take as directed and contact our office if you believe you are having problem(s) with the medication(s).   A referral was ordered for cardiology.   Someone from their office will call you to schedule an appointment.    Please followup in 6 months

## 2021-02-10 NOTE — Progress Notes (Signed)
Subjective:    Patient ID: Colin Love, male    DOB: 05/15/1945, 75 y.o.   MRN: [id number]  This visit occurred during the SARS-CoV-2 public health emergency.  Safety protocols were in place, including screening questions prior to the visit, additional usage of staff PPE, and extensive cleaning of exam room while observing appropriate contact time as indicated for disinfecting solutions.     HPI The patient is here for follow up of their chronic medical problems, including DM, htn, hld, burning sensation in feet, spinal stenosis with chronic back pain ( gets injections)  He is not exercising regularly.  His back limits him.    He is taking all of his medications as prescribed.    Medications and allergies reviewed with patient and updated if appropriate.  Patient Active Problem List   Diagnosis Date Noted   BPH (benign prostatic hyperplasia) 08/08/2020   Spinal stenosis of lumbar region without neurogenic claudication 01/31/2020   Chondrocalcinosis of left knee 03/21/2019   Sciatica of right side 08/04/2018   Abnormal echocardiogram 05/26/2016   Burning sensation of feet 05/26/2016   Sudden right hearing loss 08/07/2015   Irregular heart rhythm 08/07/2015   Glaucoma 05/21/2015   PAC (premature atrial contraction) 01/24/2014   Bladder cancer ([place]) 01/09/2014   Tobacco dependence due to cigarettes    Diabetes type 2, controlled ([place]) 12/23/2009   Dyslipidemia 12/23/2009   Essential hypertension 12/23/2009   COLONIC POLYPS, HX OF 12/23/2009   DIVERTICULITIS, HX OF 12/23/2009    Current Outpatient Medications on File Prior to Visit  Medication Sig Dispense Refill   ACCU-CHEK SOFTCLIX LANCETS lancets TEST UP TO 4 TIMES A DAY 300 each 1   aspirin 81 MG tablet Take 81 mg by mouth daily.     atorvastatin (LIPITOR) 20 MG tablet TAKE 1 TABLET BY MOUTH ONCE DAILY AT 6PM 90 tablet 3   blood glucose meter kit and supplies KIT Dispense based on patient and insurance  preference. Use up to four times daily as directed. (FOR ICD-9 250.00, 250.01). 1 each 0   diltiazem (CARDIZEM CD) 180 MG 24 hr capsule TAKE 1 CAPSULE BY MOUTH 2 TIMES A DAY 180 capsule 3   gabapentin (NEURONTIN) 100 MG capsule Take 1-2 capsules (100-200 mg total) by mouth daily as needed. 90 capsule 3   latanoprost (XALATAN) 0.005 % ophthalmic solution Place 1 drop into both eyes at bedtime.      lisinopril-hydrochlorothiazide (ZESTORETIC) 20-12.5 MG tablet TAKE 1 TABLET BY MOUTH 2 TIMES A DAY 180 tablet 3   meloxicam (MOBIC) 15 MG tablet Take 15 mg by mouth daily.     metFORMIN (GLUCOPHAGE) 500 MG tablet TAKE 1 TABLET BY MOUTH 2 TIMES DAILY WITH A MEAL. 180 tablet 1   ONE TOUCH ULTRA TEST test strip TEST UP TO 4 TIMES DAILY 100 each 1   potassium chloride (KLOR-CON) 10 MEQ tablet TAKE 3 TABLETS BY MOUTH DAILY. 270 tablet 1   tamsulosin (FLOMAX) 0.4 MG CAPS capsule Take 0.4 mg by mouth daily.     terazosin (HYTRIN) 10 MG capsule TAKE 1 CAPSULE BY MOUTH EVERYDAY AT BEDTIME 90 capsule 1   No current facility-administered medications on file prior to visit.    Past Medical History:  Diagnosis Date   Adenoma of left adrenal gland    At risk for sleep apnea    STOP-BANG= 4    SENT TO PCP 02-06-2014   Bladder cancer ([place]) 02/2014   local, low  grade pap uro carcinoma   BPH (benign prostatic hypertrophy)    Diverticulosis of colon    Full dentures    GERD (gastroesophageal reflux disease)    History of adenomatous polyp of colon    History of diverticulitis of colon    Hyperlipidemia    Hypertension    Murmur, cardiac    PAC (premature atrial contraction)    Type 2 diabetes mellitus [facility])     Past Surgical History:  Procedure Laterality Date   ANTERIOR CERVICAL DECOMP/DISCECTOMY FUSION  05-24-2003   C4  --  C7   COLONOSCOPY W/ POLYPECTOMY  last one 07-24-2013 w/ EGD   CYSTOSCOPY W/ RETROGRADES Bilateral 02/09/2014   Procedure: BILATERAL RETROGRADE PYELOGRAM , Cystoscopy;  Surgeon:  Ardis Hughs, MD;  [facility];  Service: Urology;  Laterality: Bilateral;   TRANSURETHRAL RESECTION OF BLADDER TUMOR WITH GYRUS (TURBT-GYRUS) N/A 02/09/2014   Procedure: Bladder Biopsies;  Surgeon: Ardis Hughs, MD;  Location: [facility];  Service: Urology;  Laterality: N/A;    Social History   Socioeconomic History   Marital status: Married    Spouse name: Not on file   Number of children: Not on file   Years of education: Not on file   Highest education level: Not on file  Occupational History   Not on file  Tobacco Use   Smoking status: Every Day    Packs/day: 0.50    Years: 40.00    Pack years: 20.00    Types: Cigarettes   Smokeless tobacco: Never  Vaping Use   Vaping Use: Never used  Substance and Sexual Activity   Alcohol use: Yes    Comment: OCCASIONAL   Drug use: No   Sexual activity: Not Currently  Other Topics Concern   Not on file  Social History Narrative   Not on file   Social Determinants of Health   Financial Resource Strain: Low Risk    Difficulty of Paying Living Expenses: Not hard at all  Food Insecurity: No Food Insecurity   Worried About Charity fundraiser in the Last Year: Never true   [place] in the Last Year: Never true  Transportation Needs: No Transportation Needs   Lack of Transportation (Medical): No   Lack of Transportation (Non-Medical): No  Physical Activity: Sufficiently Active   Days of Exercise per Week: 1 day   Minutes of Exercise per Session: 150+ min  Stress: No Stress Concern Present   Feeling of Stress : Not at all  Social Connections: Socially Integrated   Frequency of Communication with Friends and Family: More than three times a week   Frequency of Social Gatherings with Friends and Family: More than three times a week   Attends Religious Services: More than 4 times per year   Active Member of [employer] or Organizations: Yes   Attends Music therapist:  More than 4 times per year   Marital Status: Married    Family History  Problem Relation Age of Onset   Colon cancer Mother    Hypertension Mother    Hyperlipidemia Mother    Hypertension Father    Hyperlipidemia Father    Diabetes Brother     Review of Systems  Constitutional:  Negative for fever.  Respiratory:  Negative for cough, shortness of breath and wheezing.   Cardiovascular:  Positive for leg swelling (occ on left). Negative for chest pain and palpitations.  Neurological:  Positive for light-headedness (occ).  Negative for headaches.      Objective:   Vitals:   02/11/21 0940  BP: 112/78  Pulse: 78  Temp: 98.4 F (36.9 C)  SpO2: 99%   BP Readings from Last 3 Encounters:  02/11/21 112/78  10/21/20 135/86  10/10/20 120/70   Wt Readings from Last 3 Encounters:  02/11/21 191 lb (86.6 kg)  10/10/20 189 lb 9.6 oz (86 kg)  08/08/20 191 lb 12.8 oz (87 kg)   Body mass index is 29.91 kg/m.   Physical Exam    Constitutional: Appears well-developed and well-nourished. No distress.  HENT:  Head: Normocephalic and atraumatic.  Neck: Neck supple. No tracheal deviation present. No thyromegaly present.  No cervical lymphadenopathy Cardiovascular: Normal rate, irregularly irregular rhythm and normal heart sounds.  No murmur heard. No carotid bruit .  No edema Pulmonary/Chest: Effort normal and breath sounds normal. No respiratory distress. No has no wheezes. No rales.  Skin: Skin is warm and dry. Not diaphoretic.  Psychiatric: Normal mood and affect. Behavior is normal.      Assessment & Plan:    See Problem List for Assessment and Plan of chronic medical problems.

## 2021-02-11 ENCOUNTER — Ambulatory Visit (INDEPENDENT_AMBULATORY_CARE_PROVIDER_SITE_OTHER): Payer: Medicare HMO | Admitting: Internal Medicine

## 2021-02-11 ENCOUNTER — Other Ambulatory Visit: Payer: Self-pay

## 2021-02-11 VITALS — BP 112/78 | HR 78 | Temp 98.4°F | Ht 67.0 in | Wt 191.0 lb

## 2021-02-11 DIAGNOSIS — M48061 Spinal stenosis, lumbar region without neurogenic claudication: Secondary | ICD-10-CM

## 2021-02-11 DIAGNOSIS — I491 Atrial premature depolarization: Secondary | ICD-10-CM

## 2021-02-11 DIAGNOSIS — R208 Other disturbances of skin sensation: Secondary | ICD-10-CM | POA: Diagnosis not present

## 2021-02-11 DIAGNOSIS — E118 Type 2 diabetes mellitus with unspecified complications: Secondary | ICD-10-CM

## 2021-02-11 DIAGNOSIS — E785 Hyperlipidemia, unspecified: Secondary | ICD-10-CM

## 2021-02-11 DIAGNOSIS — N400 Enlarged prostate without lower urinary tract symptoms: Secondary | ICD-10-CM | POA: Diagnosis not present

## 2021-02-11 DIAGNOSIS — I10 Essential (primary) hypertension: Secondary | ICD-10-CM

## 2021-02-11 DIAGNOSIS — I480 Paroxysmal atrial fibrillation: Secondary | ICD-10-CM

## 2021-02-11 DIAGNOSIS — Z23 Encounter for immunization: Secondary | ICD-10-CM | POA: Diagnosis not present

## 2021-02-11 LAB — LIPID PANEL
Cholesterol: 117 mg/dL (ref 0–200)
HDL: 47.2 mg/dL
LDL Cholesterol: 54 mg/dL (ref 0–99)
NonHDL: 70.17
Total CHOL/HDL Ratio: 2
Triglycerides: 79 mg/dL (ref 0.0–149.0)
VLDL: 15.8 mg/dL (ref 0.0–40.0)

## 2021-02-11 LAB — COMPREHENSIVE METABOLIC PANEL WITH GFR
ALT: 16 U/L (ref 0–53)
AST: 19 U/L (ref 0–37)
Albumin: 4.3 g/dL (ref 3.5–5.2)
Alkaline Phosphatase: 62 U/L (ref 39–117)
BUN: 17 mg/dL (ref 6–23)
CO2: 29 meq/L (ref 19–32)
Calcium: 10.7 mg/dL — ABNORMAL HIGH (ref 8.4–10.5)
Chloride: 102 meq/L (ref 96–112)
Creatinine, Ser: 1.07 mg/dL (ref 0.40–1.50)
GFR: 67.57 mL/min
Glucose, Bld: 112 mg/dL — ABNORMAL HIGH (ref 70–99)
Potassium: 4.1 meq/L (ref 3.5–5.1)
Sodium: 138 meq/L (ref 135–145)
Total Bilirubin: 0.9 mg/dL (ref 0.2–1.2)
Total Protein: 7.1 g/dL (ref 6.0–8.3)

## 2021-02-11 LAB — CBC WITH DIFFERENTIAL/PLATELET
Basophils Absolute: 0 10*3/uL (ref 0.0–0.1)
Basophils Relative: 0.3 % (ref 0.0–3.0)
Eosinophils Absolute: 0.1 10*3/uL (ref 0.0–0.7)
Eosinophils Relative: 1 % (ref 0.0–5.0)
HCT: 47.5 % (ref 39.0–52.0)
Hemoglobin: 15.4 g/dL (ref 13.0–17.0)
Lymphocytes Relative: 14.8 % (ref 12.0–46.0)
Lymphs Abs: 1 10*3/uL (ref 0.7–4.0)
MCHC: 32.5 g/dL (ref 30.0–36.0)
MCV: 87.2 fl (ref 78.0–100.0)
Monocytes Absolute: 0.7 10*3/uL (ref 0.1–1.0)
Monocytes Relative: 10 % (ref 3.0–12.0)
Neutro Abs: 5 10*3/uL (ref 1.4–7.7)
Neutrophils Relative %: 73.9 % (ref 43.0–77.0)
Platelets: 172 10*3/uL (ref 150.0–400.0)
RBC: 5.44 Mil/uL (ref 4.22–5.81)
RDW: 14.7 % (ref 11.5–15.5)
WBC: 6.7 10*3/uL (ref 4.0–10.5)

## 2021-02-11 LAB — HEMOGLOBIN A1C: Hgb A1c MFr Bld: 6.6 % — ABNORMAL HIGH (ref 4.6–6.5)

## 2021-02-11 NOTE — Assessment & Plan Note (Signed)
Chronic Controlled Check A1c Continue metformin 500 mg twice daily Diabetic diet, regular exercise encouraged

## 2021-02-11 NOTE — Assessment & Plan Note (Signed)
Chronic back pain with sciatica and neurogenic claudication Gets injections, which does help Taking meloxicam 10 mg daily

## 2021-02-11 NOTE — Assessment & Plan Note (Signed)
Chronic °Regular exercise and healthy diet encouraged °Check lipid panel  °Continue atorvastatin 20 mg daily °

## 2021-02-11 NOTE — Assessment & Plan Note (Signed)
Chronic Probable neuropathy Continue gabapentin 100-20 mg daily as needed

## 2021-02-11 NOTE — Assessment & Plan Note (Addendum)
Chronic Denies palpitations EKG possible atrial fibrillation at 70 bpm, left axis deviation, T wave abnormality-compared to previous EKG from 2018 fibrillation seems to be more prominent EKG today shows possible Afib - has seen cardio in the past and it was not felt to be afib Will refer to cardio for review - cardiology referral ordered

## 2021-02-11 NOTE — Assessment & Plan Note (Signed)
Chronic Following with urology On Flomax 0.4 mg daily and Hytrin 10 mg daily

## 2021-02-11 NOTE — Assessment & Plan Note (Signed)
Chronic Blood pressure well controlled CMP Continue diltiazem 180 mg twice daily, lisinopril-hydrochlorothiazide 20-12.5 mg twice daily, Hytrin 10 mg daily

## 2021-03-28 ENCOUNTER — Ambulatory Visit: Payer: Medicare HMO | Admitting: Cardiology

## 2021-03-28 ENCOUNTER — Encounter: Payer: Self-pay | Admitting: *Deleted

## 2021-03-28 ENCOUNTER — Encounter: Payer: Self-pay | Admitting: Cardiology

## 2021-03-28 ENCOUNTER — Other Ambulatory Visit: Payer: Self-pay

## 2021-03-28 VITALS — BP 110/70 | HR 77 | Ht 67.0 in | Wt 190.0 lb

## 2021-03-28 DIAGNOSIS — I480 Paroxysmal atrial fibrillation: Secondary | ICD-10-CM

## 2021-03-28 DIAGNOSIS — I10 Essential (primary) hypertension: Secondary | ICD-10-CM

## 2021-03-28 DIAGNOSIS — E785 Hyperlipidemia, unspecified: Secondary | ICD-10-CM

## 2021-03-28 DIAGNOSIS — R69 Illness, unspecified: Secondary | ICD-10-CM | POA: Diagnosis not present

## 2021-03-28 DIAGNOSIS — I4819 Other persistent atrial fibrillation: Secondary | ICD-10-CM | POA: Insufficient documentation

## 2021-03-28 DIAGNOSIS — F17210 Nicotine dependence, cigarettes, uncomplicated: Secondary | ICD-10-CM

## 2021-03-28 MED ORDER — APIXABAN 5 MG PO TABS: 5.0000 mg | ORAL_TABLET | Freq: Two times a day (BID) | ORAL | 3 refills | Status: DC

## 2021-03-28 NOTE — Addendum Note (Signed)
Addended by: Jerline Pain on: 03/28/2021 05:13 PM   Modules accepted: Orders

## 2021-03-28 NOTE — Progress Notes (Signed)
Cardiology Office Note:    Date:  03/28/2021   ID:  Colin Love, DOB [birth year], MRN [id number]  PCP:  Binnie Rail, MD   [facility] Providers Cardiologist:  Candee Furbish, MD     Referring MD: Binnie Rail, MD    History of Present Illness:    Colin Love is a 76 y.o. male here for the evaluation of atrial fibrillation at the request of Dr. Billey Gosling.  In review of clinic note from 02/11/2021 from Dr. Quay Burow, EKG demonstrated atrial fibrillation at 70 bpm with left axis deviation and T wave abnormality compared to the prior EKG from 2018.  Other comorbidities include diabetes BPH chronic neuropathy in relation to diabetes, hyperlipidemia on atorvastatin 20 mg and hypertension on ACE inhibitor hydrochlorothiazide as well as Hytrin.  It has been since 2018 since we have seen him.  Overall he is doing quite well denies any chest pain no shortness of breath no syncope no bleeding issues.  No diabetes no strokes.  He does continue to smoke he states.    Past Medical History:  Diagnosis Date   A-fib ([place])    Adenoma of left adrenal gland    At risk for sleep apnea    STOP-BANG= 4    SENT TO PCP 02-06-2014   Bladder cancer ([place]) 02/06/2014   local, low grade pap uro carcinoma   BPH (benign prostatic hypertrophy)    Diverticulosis of colon    Full dentures    GERD (gastroesophageal reflux disease)    History of adenomatous polyp of colon    History of diverticulitis of colon    Hyperlipidemia    Hypertension    Murmur, cardiac    PAC (premature atrial contraction)    Type 2 diabetes mellitus ([place])     Past Surgical History:  Procedure Laterality Date   ANTERIOR CERVICAL DECOMP/DISCECTOMY FUSION  05-24-2003   C4  --  C7   COLONOSCOPY W/ POLYPECTOMY  last one 07-24-2013 w/ EGD   CYSTOSCOPY W/ RETROGRADES Bilateral 02/09/2014   Procedure: BILATERAL RETROGRADE PYELOGRAM , Cystoscopy;  Surgeon: Ardis Hughs, MD;  Location: [facility];   Service: Urology;  Laterality: Bilateral;   TRANSURETHRAL RESECTION OF BLADDER TUMOR WITH GYRUS (TURBT-GYRUS) N/A 02/09/2014   Procedure: Bladder Biopsies;  Surgeon: Ardis Hughs, MD;  Location: [facility];  Service: Urology;  Laterality: N/A;    Current Medications: Current Meds  Medication Sig   ACCU-CHEK SOFTCLIX LANCETS lancets TEST UP TO 4 TIMES A DAY   apixaban (ELIQUIS) 5 MG TABS tablet Take 1 tablet (5 mg total) by mouth 2 (two) times daily.   atorvastatin (LIPITOR) 20 MG tablet TAKE 1 TABLET BY MOUTH ONCE DAILY AT 6PM   blood glucose meter kit and supplies KIT Dispense based on patient and insurance preference. Use up to four times daily as directed. (FOR ICD-9 250.00, 250.01).   diltiazem (CARDIZEM CD) 180 MG 24 hr capsule TAKE 1 CAPSULE BY MOUTH 2 TIMES A DAY   gabapentin (NEURONTIN) 100 MG capsule Take 1-2 capsules (100-200 mg total) by mouth daily as needed.   latanoprost (XALATAN) 0.005 % ophthalmic solution Place 1 drop into both eyes at bedtime.    lisinopril-hydrochlorothiazide (ZESTORETIC) 20-12.5 MG tablet TAKE 1 TABLET BY MOUTH 2 TIMES A DAY   meloxicam (MOBIC) 15 MG tablet Take 15 mg by mouth daily.   metFORMIN (GLUCOPHAGE) 500 MG tablet TAKE 1 TABLET BY MOUTH 2 TIMES DAILY WITH  A MEAL.   ONE TOUCH ULTRA TEST test strip TEST UP TO 4 TIMES DAILY   potassium chloride (KLOR-CON) 10 MEQ tablet TAKE 3 TABLETS BY MOUTH DAILY.   tamsulosin (FLOMAX) 0.4 MG CAPS capsule Take 0.4 mg by mouth daily.   terazosin (HYTRIN) 10 MG capsule TAKE 1 CAPSULE BY MOUTH EVERYDAY AT BEDTIME   [DISCONTINUED] aspirin 81 MG tablet Take 81 mg by mouth daily.     Allergies:   Patient has no known allergies.   Social History   Socioeconomic History   Marital status: Married    Spouse name: Not on file   Number of children: Not on file   Years of education: Not on file   Highest education level: Not on file  Occupational History   Not on file  Tobacco Use   Smoking  status: Every Day    Packs/day: 0.50    Years: 40.00    Pack years: 20.00    Types: Cigarettes   Smokeless tobacco: Never  Vaping Use   Vaping Use: Never used  Substance and Sexual Activity   Alcohol use: Yes    Comment: OCCASIONAL   Drug use: No   Sexual activity: Not Currently  Other Topics Concern   Not on file  Social History Narrative   Not on file   Social Determinants of Health   Financial Resource Strain: Low Risk    Difficulty of Paying Living Expenses: Not hard at all  Food Insecurity: No Food Insecurity   Worried About Charity fundraiser in the Last Year: Never true   [place] in the Last Year: Never true  Transportation Needs: No Transportation Needs   Lack of Transportation (Medical): No   Lack of Transportation (Non-Medical): No  Physical Activity: Sufficiently Active   Days of Exercise per Week: 1 day   Minutes of Exercise per Session: 150+ min  Stress: No Stress Concern Present   Feeling of Stress : Not at all  Social Connections: Socially Integrated   Frequency of Communication with Friends and Family: More than three times a week   Frequency of Social Gatherings with Friends and Family: More than three times a week   Attends Religious Services: More than 4 times per year   Active Member of [employer] or Organizations: Yes   Attends Music therapist: More than 4 times per year   Marital Status: Married     Family History: The patient's family history includes Colon cancer in his mother; Diabetes in his brother; Hyperlipidemia in his father and mother; Hypertension in his father and mother.  ROS:   Please see the history of present illness.    No chest pain fevers chills nausea vomiting syncope bleeding all other systems reviewed and are negative.  EKGs/Labs/Other Studies Reviewed:    The following studies were reviewed today: Prior echocardiogram 2017 mild left atrial enlargement normal EF.  EKG:  EKG is  ordered today.  The ekg  ordered today demonstrates atrial fibrillation heart rate 72 bpm -No significant change from prior EKG.  Personally reviewed.  Recent Labs: 02/11/2021: ALT 16; BUN 17; Creatinine, Ser 1.07; Hemoglobin 15.4; Platelets 172.0; Potassium 4.1; Sodium 138  Recent Lipid Panel    Component Value Date/Time   CHOL 117 02/11/2021 1029   TRIG 79.0 02/11/2021 1029   TRIG 138 05/31/2009 0000   HDL 47.20 02/11/2021 1029   CHOLHDL 2 02/11/2021 1029   VLDL 15.8 02/11/2021 1029   LDLCALC 54 02/11/2021 1029  Risk Assessment/Calculations:    CHA2DS2-VASc Score = 4   This indicates a 4.8% annual risk of stroke. The patient's score is based upon: CHF History: 0 HTN History: 1 Diabetes History: 1 Stroke History: 0 Vascular Disease History: 0 Age Score: 2 Gender Score: 0              Physical Exam:    VS:  BP 110/70 (BP Location: Left Arm, Patient Position: Sitting, Cuff Size: Normal)    Pulse 77    Ht 5' 7" (1.702 m)    Wt 190 lb (86.2 kg)    SpO2 98%    BMI 29.76 kg/m     Wt Readings from Last 3 Encounters:  03/28/21 190 lb (86.2 kg)  02/11/21 191 lb (86.6 kg)  10/10/20 189 lb 9.6 oz (86 kg)     GEN:  Well nourished, well developed in no acute distress HEENT: Normal NECK: No JVD; No carotid bruits LYMPHATICS: No lymphadenopathy CARDIAC: Irregularly irregular, no murmurs, no rubs, gallops RESPIRATORY:  Clear to auscultation without rales, wheezing or rhonchi  ABDOMEN: Soft, non-tender, non-distended MUSCULOSKELETAL:  No edema; No deformity  SKIN: Warm and dry NEUROLOGIC:  Alert and oriented x 3 PSYCHIATRIC:  Normal affect   ASSESSMENT:    1. Persistent atrial fibrillation ([place])   2. Essential hypertension   3. Paroxysmal atrial fibrillation (HCC)   4. Tobacco dependence due to cigarettes   5. Dyslipidemia    PLAN:    In order of problems listed above:  Persistent atrial fibrillation (HCC) Atrial fibrillation detected by Dr. Quay Burow.  We will go ahead and start  Eliquis 5 mg twice a day.  Stop aspirin 81 mg.  We will check an echocardiogram.  Prior left atrial size mildly enlarged with normal EF.  His rate control is perfect at this point.  He is on diltiazem 180.  No changes made.  After 4 weeks, we will go ahead and set him up for a cardioversion.  Hopefully will be able to restore sinus rhythm if not he is under excellent rate control.  Risks and benefits of procedure discussed.  Tobacco dependence due to cigarettes Continue to encourage tobacco cessation.  Discussed.  Essential hypertension Overall well controlled.  Multidrug regimen noted.  Dyslipidemia On atorvastatin 20 mg a day.  Prior LDL 54.  Excellent.  No myalgias.     Shared Decision Making/Informed Consent The risks (stroke, cardiac arrhythmias rarely resulting in the need for a temporary or permanent pacemaker, skin irritation or burns and complications associated with conscious sedation including aspiration, arrhythmia, respiratory failure and death), benefits (restoration of normal sinus rhythm) and alternatives of a direct current cardioversion were explained in detail to Mr. Witter and he agrees to proceed.      Medication Adjustments/Labs and Tests Ordered: Current medicines are reviewed at length with the patient today.  Concerns regarding medicines are outlined above.  Orders Placed This Encounter  Procedures   EKG 12-Lead   ECHOCARDIOGRAM COMPLETE   Meds ordered this encounter  Medications   apixaban (ELIQUIS) 5 MG TABS tablet    Sig: Take 1 tablet (5 mg total) by mouth 2 (two) times daily.    Dispense:  60 tablet    Refill:  3    Patient Instructions  Medication Instructions:  Please start Eliquis 5 mg one tablet twice a day. Discontinue your Asprin.  Please use Mobic as sparingly as possible while taking Eliquis. Continue all other medications as listed.   *If you need  a refill on your cardiac medications before your next appointment, please call your  pharmacy*  Lab Work: None today  If you have labs (blood work) drawn today and your tests are completely normal, you will receive your results only by: [place] (if you have MyChart) OR A paper copy in the mail If you have any lab test that is abnormal or we need to change your treatment, we will call you to review the results.   Testing/Procedures: Your physician has requested that you have an echocardiogram. Echocardiography is a painless test that uses sound waves to create images of your heart. It provides your doctor with information about the size and shape of your heart and how well your hearts chambers and valves are working. This procedure takes approximately one hour. There are no restrictions for this procedure.  Your physician has requested that you have a Cardioversion. Electrical Cardioversion uses a jolt of electricity to your heart either through paddles or wired patches attached to your chest. This is a controlled, usually prescheduled, procedure. This procedure is done at the hospital and you are not awake during the procedure. You usually go home the day of the procedure. Please see the instruction sheet given to you today for more information.  This will be completed at [place]: At [facility], you and your health needs are our priority.  As part of our continuing mission to provide you with exceptional heart care, we have created designated Provider Care Teams.  These Care Teams include your primary Cardiologist (physician) and Advanced Practice Providers (APPs -  Physician Assistants and Nurse Practitioners) who all work together to provide you with the care you need, when you need it.  We recommend signing up for the patient portal called "MyChart".  Sign up information is provided on this After Visit Summary.  MyChart is used to connect with patients for Virtual Visits (Telemedicine).  Patients are able to view lab/test results, encounter notes,  upcoming appointments, etc.  Non-urgent messages can be sent to your provider as well.   To learn more about what you can do with MyChart, go to [email or web address].    Your next appointment:   6 month(s)  The format for your next appointment:   In Person  Provider:   Dr Candee Furbish    Thank you for choosing [facility]!!      Signed, Candee Furbish, MD  03/28/2021 9:27 AM    [place]

## 2021-03-28 NOTE — Patient Instructions (Addendum)
Medication Instructions:  Please start Eliquis 5 mg one tablet twice a day. Discontinue your Asprin.  Please use Mobic as sparingly as possible while taking Eliquis. Continue all other medications as listed.   *If you need a refill on your cardiac medications before your next appointment, please call your pharmacy*  Lab Work: None today  If you have labs (blood work) drawn today and your tests are completely normal, you will receive your results only by: [place] (if you have MyChart) OR A paper copy in the mail If you have any lab test that is abnormal or we need to change your treatment, we will call you to review the results.   Testing/Procedures: Your physician has requested that you have an echocardiogram. Echocardiography is a painless test that uses sound waves to create images of your heart. It provides your doctor with information about the size and shape of your heart and how well your hearts chambers and valves are working. This procedure takes approximately one hour. There are no restrictions for this procedure.  Your physician has requested that you have a Cardioversion. Electrical Cardioversion uses a jolt of electricity to your heart either through paddles or wired patches attached to your chest. This is a controlled, usually prescheduled, procedure. This procedure is done at the hospital and you are not awake during the procedure. You usually go home the day of the procedure. Please see the instruction sheet given to you today for more information.  This will be completed at [place]: At [facility], you and your health needs are our priority.  As part of our continuing mission to provide you with exceptional heart care, we have created designated Provider Care Teams.  These Care Teams include your primary Cardiologist (physician) and Advanced Practice Providers (APPs -  Physician Assistants and Nurse Practitioners) who all work together to provide  you with the care you need, when you need it.  We recommend signing up for the patient portal called "MyChart".  Sign up information is provided on this After Visit Summary.  MyChart is used to connect with patients for Virtual Visits (Telemedicine).  Patients are able to view lab/test results, encounter notes, upcoming appointments, etc.  Non-urgent messages can be sent to your provider as well.   To learn more about what you can do with MyChart, go to [email or web address].    Your next appointment:   6 month(s)  The format for your next appointment:   In Person  Provider:   Dr Candee Furbish    Thank you for choosing [facility]!!

## 2021-03-28 NOTE — H&P (View-Only) (Signed)
°Cardiology Office Note:   ° °Date:  03/28/2021  ° °ID:  Colin Love, DOB 01/22/1946, MRN [id number] ° °PCP:  Burns, Stacy J, MD °  °[facility] Providers °Cardiologist:  Trevontae Lindahl, MD    ° °Referring MD: Burns, Stacy J, MD  ° ° °History of Present Illness:   ° °Colin Love is a 75 y.o. male here for the evaluation of atrial fibrillation at the request of Dr. Stacy Burns.  In review of clinic note from 02/11/2021 from Dr. Burns, EKG demonstrated atrial fibrillation at 70 bpm with left axis deviation and T wave abnormality compared to the prior EKG from 2018. ° °Other comorbidities include diabetes BPH chronic neuropathy in relation to diabetes, hyperlipidemia on atorvastatin 20 mg and hypertension on ACE inhibitor hydrochlorothiazide as well as Hytrin. ° °It has been since 2018 since we have seen him. ° °Overall he is doing quite well denies any chest pain no shortness of breath no syncope no bleeding issues.  No diabetes no strokes. ° °He does continue to smoke he states. ° ° ° °Past Medical History:  °Diagnosis Date  ° A-fib (HCC)   ° Adenoma of left adrenal gland   ° At risk for sleep apnea   ° STOP-BANG= 4    SENT TO PCP 02-06-2014  ° Bladder cancer (HCC) 02/06/2014  ° local, low grade pap uro carcinoma  ° BPH (benign prostatic hypertrophy)   ° Diverticulosis of colon   ° Full dentures   ° GERD (gastroesophageal reflux disease)   ° History of adenomatous polyp of colon   ° History of diverticulitis of colon   ° Hyperlipidemia   ° Hypertension   ° Murmur, cardiac   ° PAC (premature atrial contraction)   ° Type 2 diabetes mellitus (HCC)   ° ° °Past Surgical History:  °Procedure Laterality Date  ° ANTERIOR CERVICAL DECOMP/DISCECTOMY FUSION  05-24-2003  ° C4  --  C7  ° COLONOSCOPY W/ POLYPECTOMY  last one 07-24-2013 w/ EGD  ° CYSTOSCOPY W/ RETROGRADES Bilateral 02/09/2014  ° Procedure: BILATERAL RETROGRADE PYELOGRAM , Cystoscopy;  Surgeon: Benjamin W Herrick, MD;  Location: [facility];   Service: Urology;  Laterality: Bilateral;  ° TRANSURETHRAL RESECTION OF BLADDER TUMOR WITH GYRUS (TURBT-GYRUS) N/A 02/09/2014  ° Procedure: Bladder Biopsies;  Surgeon: Benjamin W Herrick, MD;  Location: [facility];  Service: Urology;  Laterality: N/A;  ° ° °Current Medications: °Current Meds  °Medication Sig  ° ACCU-CHEK SOFTCLIX LANCETS lancets TEST UP TO 4 TIMES A DAY  ° apixaban (ELIQUIS) 5 MG TABS tablet Take 1 tablet (5 mg total) by mouth 2 (two) times daily.  ° atorvastatin (LIPITOR) 20 MG tablet TAKE 1 TABLET BY MOUTH ONCE DAILY AT 6PM  ° blood glucose meter kit and supplies KIT Dispense based on patient and insurance preference. Use up to four times daily as directed. (FOR ICD-9 250.00, 250.01).  ° diltiazem (CARDIZEM CD) 180 MG 24 hr capsule TAKE 1 CAPSULE BY MOUTH 2 TIMES A DAY  ° gabapentin (NEURONTIN) 100 MG capsule Take 1-2 capsules (100-200 mg total) by mouth daily as needed.  ° latanoprost (XALATAN) 0.005 % ophthalmic solution Place 1 drop into both eyes at bedtime.   ° lisinopril-hydrochlorothiazide (ZESTORETIC) 20-12.5 MG tablet TAKE 1 TABLET BY MOUTH 2 TIMES A DAY  ° meloxicam (MOBIC) 15 MG tablet Take 15 mg by mouth daily.  ° metFORMIN (GLUCOPHAGE) 500 MG tablet TAKE 1 TABLET BY MOUTH 2 TIMES DAILY WITH A   A MEAL.   ONE TOUCH ULTRA TEST test strip TEST UP TO 4 TIMES DAILY   potassium chloride (KLOR-CON) 10 MEQ tablet TAKE 3 TABLETS BY MOUTH DAILY.   tamsulosin (FLOMAX) 0.4 MG CAPS capsule Take 0.4 mg by mouth daily.   terazosin (HYTRIN) 10 MG capsule TAKE 1 CAPSULE BY MOUTH EVERYDAY AT BEDTIME   [DISCONTINUED] aspirin 81 MG tablet Take 81 mg by mouth daily.     Allergies:   Patient has no known allergies.   Social History   Socioeconomic History   Marital status: Married    Spouse name: Not on file   Number of children: Not on file   Years of education: Not on file   Highest education level: Not on file  Occupational History   Not on file  Tobacco Use   Smoking  status: Every Day    Packs/day: 0.50    Years: 40.00    Pack years: 20.00    Types: Cigarettes   Smokeless tobacco: Never  Vaping Use   Vaping Use: Never used  Substance and Sexual Activity   Alcohol use: Yes    Comment: OCCASIONAL   Drug use: No   Sexual activity: Not Currently  Other Topics Concern   Not on file  Social History Narrative   Not on file   Social Determinants of Health   Financial Resource Strain: Low Risk    Difficulty of Paying Living Expenses: Not hard at all  Food Insecurity: No Food Insecurity   Worried About Charity fundraiser in the Last Year: Never true   [place] in the Last Year: Never true  Transportation Needs: No Transportation Needs   Lack of Transportation (Medical): No   Lack of Transportation (Non-Medical): No  Physical Activity: Sufficiently Active   Days of Exercise per Week: 1 day   Minutes of Exercise per Session: 150+ min  Stress: No Stress Concern Present   Feeling of Stress : Not at all  Social Connections: Socially Integrated   Frequency of Communication with Friends and Family: More than three times a week   Frequency of Social Gatherings with Friends and Family: More than three times a week   Attends Religious Services: More than 4 times per year   Active Member of [employer] or Organizations: Yes   Attends Music therapist: More than 4 times per year   Marital Status: Married     Family History: The patient's family history includes Colon cancer in his mother; Diabetes in his brother; Hyperlipidemia in his father and mother; Hypertension in his father and mother.  ROS:   Please see the history of present illness.    No chest pain fevers chills nausea vomiting syncope bleeding all other systems reviewed and are negative.  EKGs/Labs/Other Studies Reviewed:    The following studies were reviewed today: Prior echocardiogram 2017 mild left atrial enlargement normal EF.  EKG:  EKG is  ordered today.  The ekg  ordered today demonstrates atrial fibrillation heart rate 72 bpm -No significant change from prior EKG.  Personally reviewed.  Recent Labs: 02/11/2021: ALT 16; BUN 17; Creatinine, Ser 1.07; Hemoglobin 15.4; Platelets 172.0; Potassium 4.1; Sodium 138  Recent Lipid Panel    Component Value Date/Time   CHOL 117 02/11/2021 1029   TRIG 79.0 02/11/2021 1029   TRIG 138 05/31/2009 0000   HDL 47.20 02/11/2021 1029   CHOLHDL 2 02/11/2021 1029   VLDL 15.8 02/11/2021 1029   LDLCALC 54 02/11/2021 1029  Risk Assessment/Calculations:    CHA2DS2-VASc Score = 4   This indicates a 4.8% annual risk of stroke. The patient's score is based upon: CHF History: 0 HTN History: 1 Diabetes History: 1 Stroke History: 0 Vascular Disease History: 0 Age Score: 2 Gender Score: 0              Physical Exam:    VS:  BP 110/70 (BP Location: Left Arm, Patient Position: Sitting, Cuff Size: Normal)    Pulse 77    Ht 5' 7" (1.702 m)    Wt 190 lb (86.2 kg)    SpO2 98%    BMI 29.76 kg/m     Wt Readings from Last 3 Encounters:  03/28/21 190 lb (86.2 kg)  02/11/21 191 lb (86.6 kg)  10/10/20 189 lb 9.6 oz (86 kg)     GEN:  Well nourished, well developed in no acute distress HEENT: Normal NECK: No JVD; No carotid bruits LYMPHATICS: No lymphadenopathy CARDIAC: Irregularly irregular, no murmurs, no rubs, gallops RESPIRATORY:  Clear to auscultation without rales, wheezing or rhonchi  ABDOMEN: Soft, non-tender, non-distended MUSCULOSKELETAL:  No edema; No deformity  SKIN: Warm and dry NEUROLOGIC:  Alert and oriented x 3 PSYCHIATRIC:  Normal affect   ASSESSMENT:    1. Persistent atrial fibrillation ([place])   2. Essential hypertension   3. Paroxysmal atrial fibrillation (HCC)   4. Tobacco dependence due to cigarettes   5. Dyslipidemia    PLAN:    In order of problems listed above:  Persistent atrial fibrillation (HCC) Atrial fibrillation detected by Dr. Quay Burow.  We will go ahead and start  Eliquis 5 mg twice a day.  Stop aspirin 81 mg.  We will check an echocardiogram.  Prior left atrial size mildly enlarged with normal EF.  His rate control is perfect at this point.  He is on diltiazem 180.  No changes made.  After 4 weeks, we will go ahead and set him up for a cardioversion.  Hopefully will be able to restore sinus rhythm if not he is under excellent rate control.  Risks and benefits of procedure discussed.  Tobacco dependence due to cigarettes Continue to encourage tobacco cessation.  Discussed.  Essential hypertension Overall well controlled.  Multidrug regimen noted.  Dyslipidemia On atorvastatin 20 mg a day.  Prior LDL 54.  Excellent.  No myalgias.     Shared Decision Making/Informed Consent The risks (stroke, cardiac arrhythmias rarely resulting in the need for a temporary or permanent pacemaker, skin irritation or burns and complications associated with conscious sedation including aspiration, arrhythmia, respiratory failure and death), benefits (restoration of normal sinus rhythm) and alternatives of a direct current cardioversion were explained in detail to Mr. Witter and he agrees to proceed.      Medication Adjustments/Labs and Tests Ordered: Current medicines are reviewed at length with the patient today.  Concerns regarding medicines are outlined above.  Orders Placed This Encounter  Procedures   EKG 12-Lead   ECHOCARDIOGRAM COMPLETE   Meds ordered this encounter  Medications   apixaban (ELIQUIS) 5 MG TABS tablet    Sig: Take 1 tablet (5 mg total) by mouth 2 (two) times daily.    Dispense:  60 tablet    Refill:  3    Patient Instructions  Medication Instructions:  Please start Eliquis 5 mg one tablet twice a day. Discontinue your Asprin.  Please use Mobic as sparingly as possible while taking Eliquis. Continue all other medications as listed.   *If you need  a refill on your cardiac medications before your next appointment, please call your  pharmacy*  Lab Work: None today  If you have labs (blood work) drawn today and your tests are completely normal, you will receive your results only by: [place] (if you have MyChart) OR A paper copy in the mail If you have any lab test that is abnormal or we need to change your treatment, we will call you to review the results.   Testing/Procedures: Your physician has requested that you have an echocardiogram. Echocardiography is a painless test that uses sound waves to create images of your heart. It provides your doctor with information about the size and shape of your heart and how well your hearts chambers and valves are working. This procedure takes approximately one hour. There are no restrictions for this procedure.  Your physician has requested that you have a Cardioversion. Electrical Cardioversion uses a jolt of electricity to your heart either through paddles or wired patches attached to your chest. This is a controlled, usually prescheduled, procedure. This procedure is done at the hospital and you are not awake during the procedure. You usually go home the day of the procedure. Please see the instruction sheet given to you today for more information.  This will be completed at [place]: At [facility], you and your health needs are our priority.  As part of our continuing mission to provide you with exceptional heart care, we have created designated Provider Care Teams.  These Care Teams include your primary Cardiologist (physician) and Advanced Practice Providers (APPs -  Physician Assistants and Nurse Practitioners) who all work together to provide you with the care you need, when you need it.  We recommend signing up for the patient portal called "MyChart".  Sign up information is provided on this After Visit Summary.  MyChart is used to connect with patients for Virtual Visits (Telemedicine).  Patients are able to view lab/test results, encounter notes,  upcoming appointments, etc.  Non-urgent messages can be sent to your provider as well.   To learn more about what you can do with MyChart, go to [email or web address].    Your next appointment:   6 month(s)  The format for your next appointment:   In Person  Provider:   Dr Candee Furbish    Thank you for choosing [facility]!!      Signed, Candee Furbish, MD  03/28/2021 9:27 AM    [place]

## 2021-03-28 NOTE — Assessment & Plan Note (Signed)
On atorvastatin 20 mg a day.  Prior LDL 54.  Excellent.  No myalgias.

## 2021-03-28 NOTE — Assessment & Plan Note (Signed)
Overall well controlled.  Multidrug regimen noted.

## 2021-03-28 NOTE — Assessment & Plan Note (Signed)
Continue to encourage tobacco cessation.  Discussed.

## 2021-03-28 NOTE — Assessment & Plan Note (Signed)
Atrial fibrillation detected by Dr. Quay Burow.  We will go ahead and start Eliquis 5 mg twice a day.  Stop aspirin 81 mg.  We will check an echocardiogram.  Prior left atrial size mildly enlarged with normal EF.  His rate control is perfect at this point.  He is on diltiazem 180.  No changes made.  After 4 weeks, we will go ahead and set him up for a cardioversion.  Hopefully will be able to restore sinus rhythm if not he is under excellent rate control.  Risks and benefits of procedure discussed.

## 2021-04-14 ENCOUNTER — Other Ambulatory Visit: Payer: Self-pay

## 2021-04-14 ENCOUNTER — Ambulatory Visit (HOSPITAL_COMMUNITY): Payer: Medicare HMO | Attending: Cardiology

## 2021-04-14 DIAGNOSIS — I4819 Other persistent atrial fibrillation: Secondary | ICD-10-CM | POA: Diagnosis present

## 2021-04-14 LAB — ECHOCARDIOGRAM COMPLETE: S' Lateral: 3.1 cm

## 2021-04-16 ENCOUNTER — Encounter (HOSPITAL_COMMUNITY): Payer: Self-pay | Admitting: Cardiovascular Disease

## 2021-04-16 NOTE — Progress Notes (Signed)
Attempted to obtain medical history via telephone, unable to reach at this time. I left a voicemail to return pre surgical testing department's phone call.  

## 2021-04-24 ENCOUNTER — Encounter (HOSPITAL_COMMUNITY)
Admission: RE | Disposition: A | Discharge status: Home / Self Care | Payer: Medicare HMO | Source: Home / Self Care | Attending: Cardiovascular Disease

## 2021-04-24 ENCOUNTER — Ambulatory Visit (HOSPITAL_COMMUNITY): Payer: Medicare HMO | Admitting: Anesthesiology

## 2021-04-24 ENCOUNTER — Encounter (HOSPITAL_COMMUNITY): Payer: Self-pay | Admitting: Cardiovascular Disease

## 2021-04-24 ENCOUNTER — Ambulatory Visit (HOSPITAL_COMMUNITY)
Admission: RE | Admit: 2021-04-24 | Discharge: 2021-04-24 | Disposition: A | Discharge status: Home / Self Care | Payer: Medicare HMO | Attending: Cardiovascular Disease | Admitting: Cardiovascular Disease

## 2021-04-24 ENCOUNTER — Ambulatory Visit (HOSPITAL_BASED_OUTPATIENT_CLINIC_OR_DEPARTMENT_OTHER): Payer: Medicare HMO | Admitting: Anesthesiology

## 2021-04-24 ENCOUNTER — Other Ambulatory Visit: Payer: Self-pay

## 2021-04-24 DIAGNOSIS — E785 Hyperlipidemia, unspecified: Secondary | ICD-10-CM | POA: Diagnosis not present

## 2021-04-24 DIAGNOSIS — I10 Essential (primary) hypertension: Secondary | ICD-10-CM | POA: Diagnosis not present

## 2021-04-24 DIAGNOSIS — R69 Illness, unspecified: Secondary | ICD-10-CM | POA: Diagnosis not present

## 2021-04-24 DIAGNOSIS — I4819 Other persistent atrial fibrillation: Secondary | ICD-10-CM | POA: Insufficient documentation

## 2021-04-24 DIAGNOSIS — E1140 Type 2 diabetes mellitus with diabetic neuropathy, unspecified: Secondary | ICD-10-CM | POA: Diagnosis not present

## 2021-04-24 DIAGNOSIS — E119 Type 2 diabetes mellitus without complications: Secondary | ICD-10-CM | POA: Insufficient documentation

## 2021-04-24 DIAGNOSIS — F17210 Nicotine dependence, cigarettes, uncomplicated: Secondary | ICD-10-CM | POA: Diagnosis not present

## 2021-04-24 DIAGNOSIS — Z79899 Other long term (current) drug therapy: Secondary | ICD-10-CM | POA: Insufficient documentation

## 2021-04-24 DIAGNOSIS — I4891 Unspecified atrial fibrillation: Secondary | ICD-10-CM | POA: Diagnosis not present

## 2021-04-24 HISTORY — PX: CARDIOVERSION: SHX1299

## 2021-04-24 LAB — POCT I-STAT, CHEM 8
BUN: 12 mg/dL (ref 8–23)
Calcium, Ion: 1.13 mmol/L — ABNORMAL LOW (ref 1.15–1.40)
Chloride: 99 mmol/L (ref 98–111)
Creatinine, Ser: 0.7 mg/dL (ref 0.61–1.24)
Glucose, Bld: 117 mg/dL — ABNORMAL HIGH (ref 70–99)
HCT: 45 % (ref 39.0–52.0)
Hemoglobin: 15.3 g/dL (ref 13.0–17.0)
Potassium: 3.3 mmol/L — ABNORMAL LOW (ref 3.5–5.1)
Sodium: 135 mmol/L (ref 135–145)
TCO2: 26 mmol/L (ref 22–32)

## 2021-04-24 LAB — GLUCOSE, CAPILLARY: Glucose-Capillary: 103 mg/dL — ABNORMAL HIGH (ref 70–99)

## 2021-04-24 SURGERY — CARDIOVERSION
Anesthesia: General

## 2021-04-24 MED ORDER — LIDOCAINE 2% (20 MG/ML) 5 ML SYRINGE
INTRAMUSCULAR | Status: DC | PRN
  Administered 2021-04-24: 20 mg via INTRAVENOUS

## 2021-04-24 MED ORDER — SODIUM CHLORIDE 0.9 % IV SOLN: INTRAVENOUS | Status: DC

## 2021-04-24 MED ORDER — PROPOFOL 10 MG/ML IV BOLUS
INTRAVENOUS | Status: DC | PRN
  Administered 2021-04-24: 80 mg via INTRAVENOUS

## 2021-04-24 NOTE — CV Procedure (Signed)
° ° °  Cardioversion Note  Colin Love [id number] 30-Aug-1945  Procedure: DC Cardioversion Indications: Cardioversion   Procedure Details Consent: Obtained Time Out: Verified patient identification, verified procedure, site/side was marked, verified correct patient position, special equipment/implants available, Radiology Safety Procedures followed,  medications/allergies/relevent history reviewed, required imaging and test results available.  Performed  The patient has been on adequate anticoagulation.  The patient received IV Licodaine 20 mg IV followed by propofol 50 mg , then 30 mg  for sedation.  Synchronous cardioversion was performed at 200, 200  joules.  The cardioversion was initially unsuccessful and the 2nd CV successful.     Complications: No apparent complications Patient did tolerate procedure well.   Thayer Headings, Brooke Bonito., MD, [facility] 04/24/2021, 9:58 AM

## 2021-04-24 NOTE — Transfer of Care (Signed)
Immediate Anesthesia Transfer of Care Note  Patient: Colin Love  Procedure(s) Performed: CARDIOVERSION  Patient Location: Endoscopy Unit  Anesthesia Type:General  Level of Consciousness: awake and alert   Airway & Oxygen Therapy: Patient Spontanous Breathing  Post-op Assessment: Report given to RN and Post -op Vital signs reviewed and stable  Post vital signs: Reviewed and stable  Last Vitals:  Vitals Value Taken Time  BP 131/77 (95)   Temp    Pulse 58 04/24/21 1001  Resp 24 04/24/21 1001  SpO2 100 % 04/24/21 1001  Vitals shown include unvalidated device data.  Last Pain:  Vitals:   04/24/21 0849  TempSrc: Temporal  PainSc: 0-No pain         Complications: No notable events documented.

## 2021-04-24 NOTE — Anesthesia Postprocedure Evaluation (Signed)
Anesthesia Post Note  Patient: Colin Love  Procedure(s) Performed: CARDIOVERSION     Patient location during evaluation: PACU Anesthesia Type: General Level of consciousness: awake and alert Pain management: pain level controlled Vital Signs Assessment: post-procedure vital signs reviewed and stable Respiratory status: spontaneous breathing, nonlabored ventilation, respiratory function stable and patient connected to nasal cannula oxygen Cardiovascular status: blood pressure returned to baseline and stable Postop Assessment: no apparent nausea or vomiting Anesthetic complications: no   No notable events documented.  Last Vitals:  Vitals:   04/24/21 1011 04/24/21 1021  BP: (!) 141/84 129/73  Pulse: 83   Resp: (!) 22   Temp:    SpO2: 100%     Last Pain:  Vitals:   04/24/21 1021  TempSrc:   PainSc: 0-No pain                 Tiajuana Amass

## 2021-04-24 NOTE — Interval H&P Note (Signed)
History and Physical Interval Note:  04/24/2021 8:45 AM  Colin Love  has presented today for surgery, with the diagnosis of ATRIAL FIB.  The various methods of treatment have been discussed with the patient and family. After consideration of risks, benefits and other options for treatment, the patient has consented to  Procedure(s): CARDIOVERSION (N/A) as a surgical intervention.  The patient's history has been reviewed, patient examined, no change in status, stable for surgery.  I have reviewed the patient's chart and labs.  Questions were answered to the patient's satisfaction.     Mertie Moores

## 2021-04-24 NOTE — Discharge Instructions (Signed)

## 2021-04-24 NOTE — Anesthesia Preprocedure Evaluation (Signed)
Anesthesia Evaluation  Patient identified by MRN, date of birth, ID band Patient awake    Reviewed: Allergy & Precautions, NPO status , Patient's Chart, lab work & pertinent test results  Airway Mallampati: II  TM Distance: >3 FB Neck ROM: Full    Dental  (+) Dental Advisory Given   Pulmonary Current Smoker,    breath sounds clear to auscultation       Cardiovascular hypertension, + dysrhythmias Atrial Fibrillation  Rhythm:Irregular Rate:Normal     Neuro/Psych negative neurological ROS     GI/Hepatic Neg liver ROS, GERD  ,  Endo/Other  diabetes, Type 2, Oral Hypoglycemic Agents  Renal/GU negative Renal ROS     Musculoskeletal  (+) Arthritis ,   Abdominal   Peds  Hematology negative hematology ROS (+)   Anesthesia Other Findings   Reproductive/Obstetrics                             Anesthesia Physical Anesthesia Plan  ASA: 2  Anesthesia Plan: General   Post-op Pain Management: Minimal or no pain anticipated   Induction: Intravenous  PONV Risk Score and Plan: 1 and Treatment may vary due to age or medical condition  Airway Management Planned: Mask and Natural Airway  Additional Equipment: None  Intra-op Plan:   Post-operative Plan:   Informed Consent: I have reviewed the patients History and Physical, chart, labs and discussed the procedure including the risks, benefits and alternatives for the proposed anesthesia with the patient or authorized representative who has indicated his/her understanding and acceptance.       Plan Discussed with: CRNA  Anesthesia Plan Comments:         Anesthesia Quick Evaluation

## 2021-04-29 DIAGNOSIS — C672 Malignant neoplasm of lateral wall of bladder: Secondary | ICD-10-CM | POA: Diagnosis not present

## 2021-05-04 ENCOUNTER — Other Ambulatory Visit: Payer: Self-pay | Admitting: Internal Medicine

## 2021-05-06 DIAGNOSIS — C672 Malignant neoplasm of lateral wall of bladder: Secondary | ICD-10-CM | POA: Diagnosis not present

## 2021-05-06 DIAGNOSIS — R972 Elevated prostate specific antigen [PSA]: Secondary | ICD-10-CM | POA: Diagnosis not present

## 2021-06-06 DIAGNOSIS — R69 Illness, unspecified: Secondary | ICD-10-CM | POA: Diagnosis not present

## 2021-06-06 DIAGNOSIS — M1990 Unspecified osteoarthritis, unspecified site: Secondary | ICD-10-CM | POA: Diagnosis not present

## 2021-06-06 DIAGNOSIS — I4891 Unspecified atrial fibrillation: Secondary | ICD-10-CM | POA: Diagnosis not present

## 2021-06-06 DIAGNOSIS — N529 Male erectile dysfunction, unspecified: Secondary | ICD-10-CM | POA: Diagnosis not present

## 2021-06-06 DIAGNOSIS — D6869 Other thrombophilia: Secondary | ICD-10-CM | POA: Diagnosis not present

## 2021-06-06 DIAGNOSIS — Z7901 Long term (current) use of anticoagulants: Secondary | ICD-10-CM | POA: Diagnosis not present

## 2021-06-06 DIAGNOSIS — E785 Hyperlipidemia, unspecified: Secondary | ICD-10-CM | POA: Diagnosis not present

## 2021-06-06 DIAGNOSIS — H409 Unspecified glaucoma: Secondary | ICD-10-CM | POA: Diagnosis not present

## 2021-06-06 DIAGNOSIS — E119 Type 2 diabetes mellitus without complications: Secondary | ICD-10-CM | POA: Diagnosis not present

## 2021-06-06 DIAGNOSIS — I2510 Atherosclerotic heart disease of native coronary artery without angina pectoris: Secondary | ICD-10-CM | POA: Diagnosis not present

## 2021-06-06 DIAGNOSIS — G8929 Other chronic pain: Secondary | ICD-10-CM | POA: Diagnosis not present

## 2021-06-06 DIAGNOSIS — I10 Essential (primary) hypertension: Secondary | ICD-10-CM | POA: Diagnosis not present

## 2021-06-09 ENCOUNTER — Telehealth: Payer: Self-pay

## 2021-06-09 NOTE — Chronic Care Management (AMB) (Signed)
? ? ?  Chronic Care Management ?Pharmacy Assistant  ? ?Name: Colin Love  MRN: [id number] DOB: [birth year] ? ?Colin Love is an 76 y.o. year old male who was called today to set up his initial visit with clinical pharmacist. ?Patient appt scheduled for 08/18/21 @ 1pm. ? ? ?Ethelene Hal ?Clinical Pharmacist Assistant ?[phone]  ?

## 2021-07-11 ENCOUNTER — Other Ambulatory Visit: Payer: Self-pay | Admitting: Internal Medicine

## 2021-07-22 DIAGNOSIS — R972 Elevated prostate specific antigen [PSA]: Secondary | ICD-10-CM | POA: Diagnosis not present

## 2021-07-29 DIAGNOSIS — R350 Frequency of micturition: Secondary | ICD-10-CM | POA: Diagnosis not present

## 2021-07-29 DIAGNOSIS — R972 Elevated prostate specific antigen [PSA]: Secondary | ICD-10-CM | POA: Diagnosis not present

## 2021-07-30 ENCOUNTER — Other Ambulatory Visit: Payer: Self-pay | Admitting: Internal Medicine

## 2021-08-06 ENCOUNTER — Other Ambulatory Visit: Payer: Self-pay | Admitting: Cardiology

## 2021-08-06 DIAGNOSIS — I4819 Other persistent atrial fibrillation: Secondary | ICD-10-CM

## 2021-08-06 NOTE — Telephone Encounter (Signed)
Prescription refill request for Eliquis received. Indication: Afib  Last office visit: 03/28/21 Marlou Porch)  Scr: 1.07 (02/11/21)  Age: 76 Weight: 86.6kg  Appropriate dose and refill sent to requested pharmacy.

## 2021-08-09 ENCOUNTER — Encounter: Payer: Self-pay | Admitting: Internal Medicine

## 2021-08-09 NOTE — Patient Instructions (Addendum)
Blood work was ordered.     Medications changes include :   chantix starter pak -- after the first month let me know and I will send in the second and third month prescription.    Your prescription(s) have been sent to your pharmacy.     Return in about 6 months (around 02/11/2022) for follow up.   Steps to Quit Smoking Smoking tobacco is the leading cause of preventable death. It can affect almost every organ in the body. Smoking puts you and those around you at risk for developing many serious chronic diseases. Quitting smoking can be very challenging. Do not get discouraged if you are not successful the first time. Some people need to make many attempts to quit before they achieve long-term success. Do your best to stick to your quit plan, and talk with your health care provider if you have any questions or concerns. How do I get ready to quit? When you decide to quit smoking, create a plan to help you succeed. Before you quit: Pick a date to quit. Set a date within the next 2 weeks to give you time to prepare. Write down the reasons why you are quitting. Keep this list in places where you will see it often. Tell your family, friends, and co-workers that you are quitting. Support from people you are close to can make quitting easier. Talk with your health care provider about your options for quitting smoking. Find out what treatment options are covered by your health insurance. Identify people, places, things, and activities that make you want to smoke (triggers). Avoid them. What first steps can I take to quit smoking? Throw away all cigarettes at home, at work, and in your car. Throw away smoking accessories, such as Scientist, research (medical). Clean your car. Make sure to empty the ashtray. Clean your home, including curtains and carpets. What strategies can I use to quit smoking? Talk with your health care provider about combining strategies, such as taking medicines while you  are also receiving in-person counseling. Using these two strategies together makes you more likely to succeed in quitting than if you used either strategy on its own. If you are pregnant or breastfeeding, talk with your health care provider about finding counseling or other support strategies to quit smoking. Do not take medicine to help you quit smoking unless your health care provider tells you to. Quit right away Quit smoking completely, instead of gradually reducing how much you smoke over a period of time. Stopping smoking right away may be more successful than gradually quitting. Attend in-person counseling to help you build problem-solving skills. You are more likely to succeed in quitting if you attend counseling sessions regularly. Even short sessions of 10 minutes can be effective. Take medicine You may take medicines to help you quit smoking. Some medicines require a prescription. You can also purchase over-the-counter medicines. Medicines may have nicotine in them to replace the nicotine in cigarettes. Medicines may: Help to stop cravings. Help to relieve withdrawal symptoms. Your health care provider may recommend: Nicotine patches, gum, or lozenges. Nicotine inhalers or sprays. Non-nicotine medicine that you take by mouth. Find resources Find resources and support systems that can help you quit smoking and remain smoke-free after you quit. These resources are most helpful when you use them often. They include: Online chats with a Social worker. Telephone quitlines. Printed Furniture conservator/restorer. Support groups or group counseling. Text messaging programs. Mobile phone apps or applications. Use apps that  can help you stick to your quit plan by providing reminders, tips, and encouragement. Examples of free services include Quit Guide from the CDC and smokefree.gov  What can I do to make it easier to quit?  Reach out to your family and friends for support and encouragement. Call  telephone quitlines, such as 1-800-QUIT-NOW, reach out to support groups, or work with a counselor for support. Ask people who smoke to avoid smoking around you. Avoid places that trigger you to smoke, such as bars, parties, or smoke-break areas at work. Spend time with people who do not smoke. Lessen the stress in your life. Stress can be a smoking trigger for some people. To lessen stress, try: Exercising regularly. Doing deep-breathing exercises. Doing yoga. Meditating. What benefits will I see if I quit smoking? Over time, you should start to see positive results, such as: Improved sense of smell and taste. Decreased coughing and sore throat. Slower heart rate. Lower blood pressure. Clearer and healthier skin. The ability to breathe more easily. Fewer sick days. Summary Quitting smoking can be very challenging. Do not get discouraged if you are not successful the first time. Some people need to make many attempts to quit before they achieve long-term success. When you decide to quit smoking, create a plan to help you succeed. Quit smoking right away, not slowly over a period of time. Find resources and support systems that can help you quit smoking and remain smoke-free after you quit. This information is not intended to replace advice given to you by your health care provider. Make sure you discuss any questions you have with your health care provider. Document Revised: 02/14/2021 Document Reviewed: 02/14/2021 Elsevier Patient Education  [place].

## 2021-08-09 NOTE — Progress Notes (Signed)
Subjective:    Patient ID: Colin Love, male    DOB: Oct 27, 1945, 76 y.o.   MRN: [id number]     HPI Malick is here for follow up of his chronic medical problems, including DM, htn, Afib, hld, burning sensation in feet, spinal stenosis w/ chronic back pain ( gets injections)  He does not exercise due to his back pain.  He is experiencing increased pain in his back and legs.  He is taking his medication as prescribed.  He is compliant with a diabetic diet.   Medications and allergies reviewed with patient and updated if appropriate.  Current Outpatient Medications on File Prior to Visit  Medication Sig Dispense Refill   ACCU-CHEK SOFTCLIX LANCETS lancets TEST UP TO 4 TIMES A DAY 300 each 1   acetaminophen (TYLENOL) 500 MG tablet Take 1,000 mg by mouth every 6 (six) hours as needed for mild pain.     apixaban (ELIQUIS) 5 MG TABS tablet TAKE 1 TABLET BY MOUTH TWICE A DAY 60 tablet 2   atorvastatin (LIPITOR) 20 MG tablet TAKE 1 TABLET BY MOUTH ONCE DAILY AT 6PM 90 tablet 3   blood glucose meter kit and supplies KIT Dispense based on patient and insurance preference. Use up to four times daily as directed. (FOR ICD-9 250.00, 250.01). 1 each 0   diclofenac Sodium (VOLTAREN) 1 % GEL Apply 2 g topically daily as needed (pain).     diltiazem (CARDIZEM CD) 180 MG 24 hr capsule TAKE 1 CAPSULE BY MOUTH TWICE A DAY 180 capsule 3   gabapentin (NEURONTIN) 100 MG capsule Take 1-2 capsules (100-200 mg total) by mouth daily as needed. (Patient taking differently: Take 100-200 mg by mouth daily as needed (nerve pain).) 90 capsule 3   latanoprost (XALATAN) 0.005 % ophthalmic solution Place 1 drop into both eyes at bedtime.      lisinopril-hydrochlorothiazide (ZESTORETIC) 20-12.5 MG tablet TAKE 1 TABLET BY MOUTH TWICE A DAY 180 tablet 3   meloxicam (MOBIC) 15 MG tablet Take 15 mg by mouth daily.     metFORMIN (GLUCOPHAGE) 500 MG tablet TAKE 1 TABLET BY MOUTH 2 TIMES DAILY WITH A MEAL. 180 tablet  1   ONE TOUCH ULTRA TEST test strip TEST UP TO 4 TIMES DAILY 100 each 1   potassium chloride (KLOR-CON) 10 MEQ tablet TAKE 3 TABLETS BY MOUTH DAILY 270 tablet 1   tamsulosin (FLOMAX) 0.4 MG CAPS capsule Take 0.4 mg by mouth daily.     terazosin (HYTRIN) 10 MG capsule TAKE 1 CAPSULE BY MOUTH EVERYDAY AT BEDTIME 90 capsule 1   No current facility-administered medications on file prior to visit.     Review of Systems  Constitutional:  Negative for fever.  Respiratory:  Positive for cough (in the morning). Negative for shortness of breath and wheezing.   Cardiovascular:  Negative for chest pain, palpitations and leg swelling.  Musculoskeletal:  Positive for back pain.  Neurological:  Negative for light-headedness and headaches.      Objective:   Vitals:   08/12/21 0938  BP: 120/68  Pulse: 94  Temp: 98.1 F (36.7 C)  SpO2: 99%   BP Readings from Last 3 Encounters:  08/12/21 120/68  04/24/21 129/73  03/28/21 110/70   Wt Readings from Last 3 Encounters:  08/12/21 190 lb (86.2 kg)  04/24/21 191 lb (86.6 kg)  03/28/21 190 lb (86.2 kg)   Body mass index is 29.82 kg/m.    Physical Exam Constitutional:  General: He is not in acute distress.    Appearance: Normal appearance. He is not ill-appearing.  HENT:     Head: Normocephalic and atraumatic.  Eyes:     Conjunctiva/sclera: Conjunctivae normal.  Cardiovascular:     Rate and Rhythm: Normal rate and regular rhythm.     Heart sounds: Normal heart sounds. No murmur heard. Pulmonary:     Effort: Pulmonary effort is normal. No respiratory distress.     Breath sounds: Normal breath sounds. No wheezing or rales.  Musculoskeletal:     Right lower leg: No edema.     Left lower leg: No edema.  Skin:    General: Skin is warm and dry.     Findings: No rash.  Neurological:     Mental Status: He is alert. Mental status is at baseline.  Psychiatric:        Mood and Affect: Mood normal.       Lab Results  Component Value  Date   WBC 6.7 02/11/2021   HGB 15.3 04/24/2021   HCT 45.0 04/24/2021   PLT 172.0 02/11/2021   GLUCOSE 117 (H) 04/24/2021   CHOL 117 02/11/2021   TRIG 79.0 02/11/2021   HDL 47.20 02/11/2021   LDLCALC 54 02/11/2021   ALT 16 02/11/2021   AST 19 02/11/2021   NA 135 04/24/2021   K 3.3 (L) 04/24/2021   CL 99 04/24/2021   CREATININE 0.70 04/24/2021   BUN 12 04/24/2021   CO2 29 02/11/2021   TSH 1.20 05/27/2017   PSA 3.21 08/08/2019   HGBA1C 6.6 (H) 02/11/2021   MICROALBUR 11.2 (H) 11/13/2014     Assessment & Plan:    See Problem List for Assessment and Plan of chronic medical problems.

## 2021-08-12 ENCOUNTER — Ambulatory Visit (INDEPENDENT_AMBULATORY_CARE_PROVIDER_SITE_OTHER): Payer: Medicare HMO | Admitting: Internal Medicine

## 2021-08-12 VITALS — BP 120/68 | HR 94 | Temp 98.1°F | Ht 66.93 in | Wt 190.0 lb

## 2021-08-12 DIAGNOSIS — I4819 Other persistent atrial fibrillation: Secondary | ICD-10-CM | POA: Diagnosis not present

## 2021-08-12 DIAGNOSIS — F17210 Nicotine dependence, cigarettes, uncomplicated: Secondary | ICD-10-CM | POA: Diagnosis not present

## 2021-08-12 DIAGNOSIS — E118 Type 2 diabetes mellitus with unspecified complications: Secondary | ICD-10-CM

## 2021-08-12 DIAGNOSIS — R208 Other disturbances of skin sensation: Secondary | ICD-10-CM | POA: Diagnosis not present

## 2021-08-12 DIAGNOSIS — E785 Hyperlipidemia, unspecified: Secondary | ICD-10-CM | POA: Diagnosis not present

## 2021-08-12 DIAGNOSIS — I10 Essential (primary) hypertension: Secondary | ICD-10-CM | POA: Diagnosis not present

## 2021-08-12 DIAGNOSIS — M48061 Spinal stenosis, lumbar region without neurogenic claudication: Secondary | ICD-10-CM | POA: Diagnosis not present

## 2021-08-12 DIAGNOSIS — R69 Illness, unspecified: Secondary | ICD-10-CM | POA: Diagnosis not present

## 2021-08-12 LAB — HEMOGLOBIN A1C: Hgb A1c MFr Bld: 6.7 % — ABNORMAL HIGH (ref 4.6–6.5)

## 2021-08-12 LAB — COMPREHENSIVE METABOLIC PANEL WITH GFR
ALT: 16 U/L (ref 0–53)
AST: 17 U/L (ref 0–37)
Albumin: 4.3 g/dL (ref 3.5–5.2)
Alkaline Phosphatase: 67 U/L (ref 39–117)
BUN: 19 mg/dL (ref 6–23)
CO2: 28 meq/L (ref 19–32)
Calcium: 11.1 mg/dL — ABNORMAL HIGH (ref 8.4–10.5)
Chloride: 101 meq/L (ref 96–112)
Creatinine, Ser: 1.03 mg/dL (ref 0.40–1.50)
GFR: 70.48 mL/min
Glucose, Bld: 123 mg/dL — ABNORMAL HIGH (ref 70–99)
Potassium: 3.7 meq/L (ref 3.5–5.1)
Sodium: 138 meq/L (ref 135–145)
Total Bilirubin: 0.9 mg/dL (ref 0.2–1.2)
Total Protein: 7.2 g/dL (ref 6.0–8.3)

## 2021-08-12 LAB — LIPID PANEL
Cholesterol: 128 mg/dL (ref 0–200)
HDL: 46.1 mg/dL
LDL Cholesterol: 61 mg/dL (ref 0–99)
NonHDL: 81.49
Total CHOL/HDL Ratio: 3
Triglycerides: 101 mg/dL (ref 0.0–149.0)
VLDL: 20.2 mg/dL (ref 0.0–40.0)

## 2021-08-12 LAB — CBC WITH DIFFERENTIAL/PLATELET
Basophils Absolute: 0 10*3/uL (ref 0.0–0.1)
Basophils Relative: 0.4 % (ref 0.0–3.0)
Eosinophils Absolute: 0.1 10*3/uL (ref 0.0–0.7)
Eosinophils Relative: 1.1 % (ref 0.0–5.0)
HCT: 47.1 % (ref 39.0–52.0)
Hemoglobin: 15.4 g/dL (ref 13.0–17.0)
Lymphocytes Relative: 14.7 % (ref 12.0–46.0)
Lymphs Abs: 1 10*3/uL (ref 0.7–4.0)
MCHC: 32.8 g/dL (ref 30.0–36.0)
MCV: 87.2 fl (ref 78.0–100.0)
Monocytes Absolute: 0.6 10*3/uL (ref 0.1–1.0)
Monocytes Relative: 8.9 % (ref 3.0–12.0)
Neutro Abs: 4.8 10*3/uL (ref 1.4–7.7)
Neutrophils Relative %: 74.9 % (ref 43.0–77.0)
Platelets: 166 10*3/uL (ref 150.0–400.0)
RBC: 5.4 Mil/uL (ref 4.22–5.81)
RDW: 15.1 % (ref 11.5–15.5)
WBC: 6.5 10*3/uL (ref 4.0–10.5)

## 2021-08-12 LAB — MICROALBUMIN / CREATININE URINE RATIO
Creatinine,U: 137.2 mg/dL
Microalb Creat Ratio: 10.4 mg/g (ref 0.0–30.0)
Microalb, Ur: 14.3 mg/dL — ABNORMAL HIGH (ref 0.0–1.9)

## 2021-08-12 MED ORDER — CHANTIX STARTING MONTH PAK 0.5 MG X 11 & 1 MG X 42 PO TBPK: ORAL_TABLET | ORAL | 0 refills | Status: DC

## 2021-08-12 NOTE — Assessment & Plan Note (Addendum)
Chronic b/l leg pain and neurogenic claudication Increased pain Was getting injections - have not helped when  He had them more recently Limited exercise due to back pain

## 2021-08-12 NOTE — Assessment & Plan Note (Signed)
Chronic  Probable neuropathy Continue gabapentin 100-200 mg daily prn

## 2021-08-12 NOTE — Assessment & Plan Note (Addendum)
Smoking cessation was discussed for more than 4 minutes.  The patient was counseled on the dangers of tobacco use, and was advised to quit and he wants to quit but states it is hard.  Reviewed ways of quitting smoking including nicotine replacement, vapping/e-cigarettes, cold [place], weaning off cigarettes, and pharmacotherapy (wellbutrin and chantix).  He has tried Wellbutrin in the past.  He is also tried the patches.  he does want to quit.  He would like to try the chantix.   Discussed possible side effects.  Advised that if he does not have any side effects after the first month we will send in the second and third month.

## 2021-08-12 NOTE — Assessment & Plan Note (Signed)
Chronic  Lab Results  Component Value Date   HGBA1C 6.6 (H) 02/11/2021   Sugars well controlled Testing sugars 1 times a day Check A1c, urine microalbumin today Continue metformin 500 mg bid Stressed regular exercise, diabetic diet

## 2021-08-12 NOTE — Assessment & Plan Note (Addendum)
S/p cardioversion 04/2021 On eliquis 5 mg bid, diltiazem 180 mg daily In sinus rhythm here today Asymptomatic Cmp, cbc

## 2021-08-12 NOTE — Assessment & Plan Note (Signed)
Chronic Check lipid panel  Continue lipitor 20 mg daily Regular exercise and healthy diet encouraged  

## 2021-08-12 NOTE — Assessment & Plan Note (Signed)
Chronic BP well controlled Continue diltiazem 180 mg daily, lisinopril-hct 20-12.5 mg bid cmp

## 2021-08-18 ENCOUNTER — Telehealth: Payer: Medicare HMO

## 2021-08-28 DIAGNOSIS — H401131 Primary open-angle glaucoma, bilateral, mild stage: Secondary | ICD-10-CM | POA: Diagnosis not present

## 2021-09-19 IMAGING — XA Imaging study
2 series · 2 of 2 positions shown · non-contrast
Comparison: none

CLINICAL DATA: Lumbosacral spondylosis without myelopathy.
Significant relief after the previous injection, without side effect
or complication. Partial recurrence of symptoms. The patient wishes
to repeat.

[Series 1: ortho standard · 1 of 1 slices shown (1 of 2)]
[im 1/1]
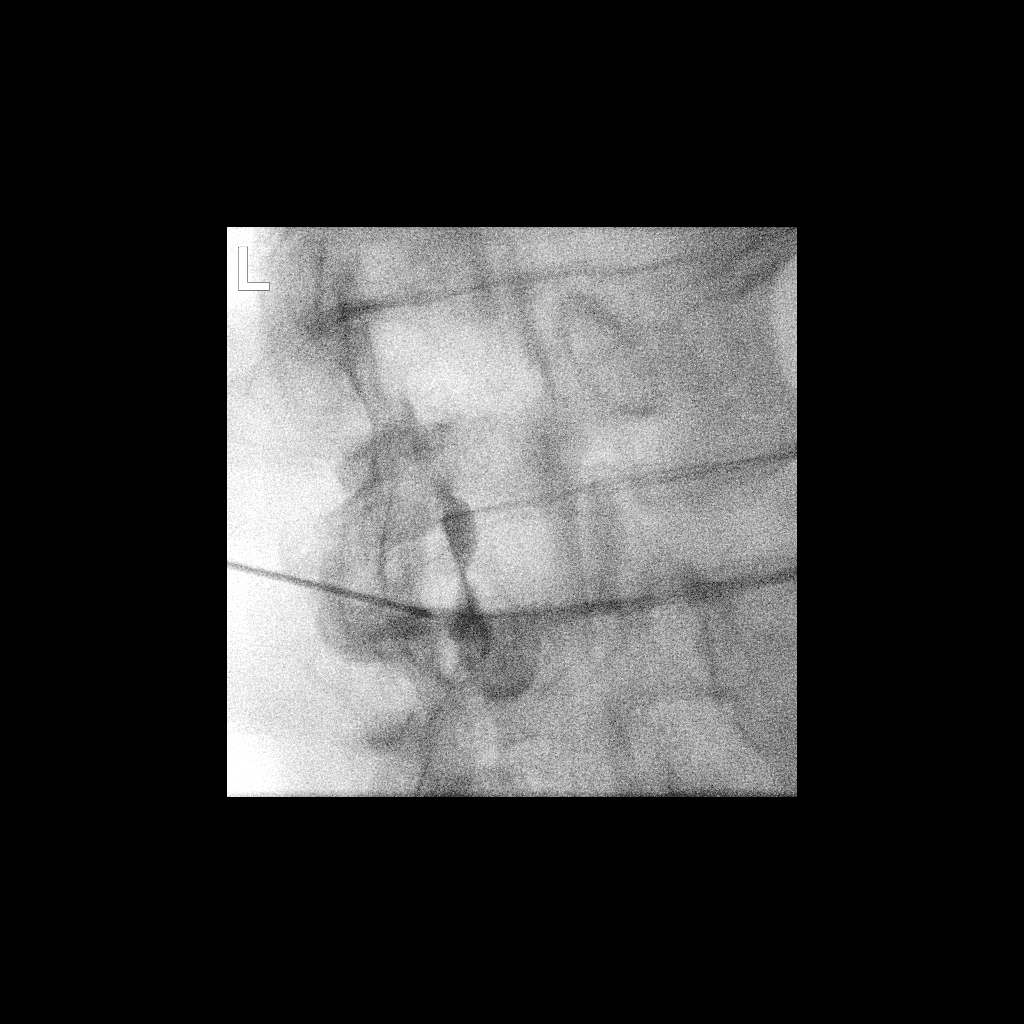

[Series 2: ortho standard · 1 of 1 slices shown (2 of 2)]
[im 1/1]
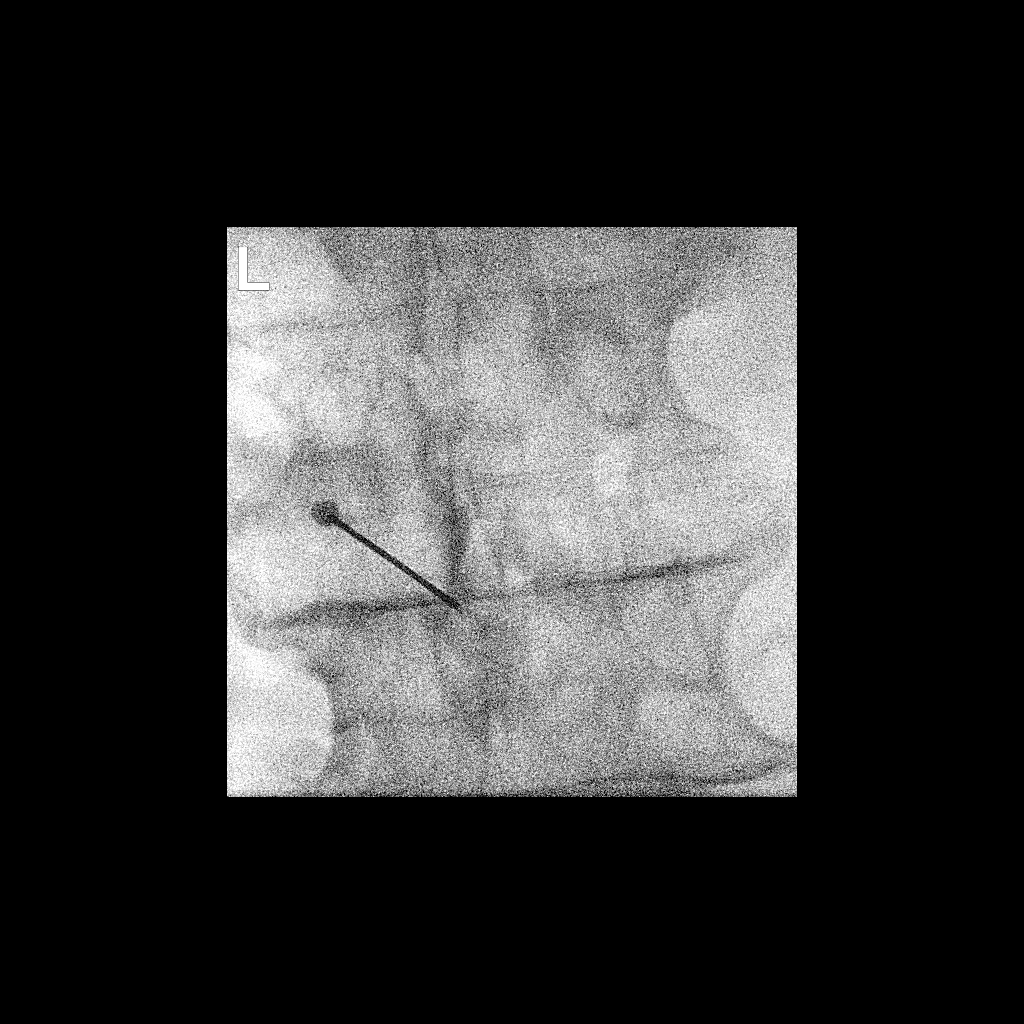

[2 of 2 positions shown; findings below may reference images not displayed]

EXAM:
LUMBAR EPIDURAL INJECTION:

DIAGNOSTIC EPIDURAL INJECTION:

THERAPEUTIC EPIDURAL INJECTION:

PROCEDURE:
The procedure, risks, benefits, and alternatives were explained to
the patient. Questions regarding the procedure were encouraged and
answered. The patient understands and consents to the procedure.

An interlaminar approach was performed on left at L3-4. The
overlying skin was cleansed and anesthetized. A 20 gauge epidural
needle was advanced using loss-of-resistance technique.

Injection of Isovue-M 200 shows a good epidural pattern with spread
above and below the level of needle placement, primarily on the left
no vascular opacification is seen.

80mg of Depo-Medrol mixed with 2ml lidocaine 1% were instilled. The
procedure was well-tolerated, and the patient was discharged thirty
minutes following the injection in good condition.

FLUOROSCOPY TIME:  23 seconds; 17 u9ymH DAP

COMPLICATIONS:
None immediate
IMPRESSION: Technically successful epidural injection on the left at L3-4.

## 2021-10-01 ENCOUNTER — Encounter: Payer: Self-pay | Admitting: Family Medicine

## 2021-10-01 ENCOUNTER — Ambulatory Visit (INDEPENDENT_AMBULATORY_CARE_PROVIDER_SITE_OTHER): Payer: Medicare HMO

## 2021-10-01 ENCOUNTER — Ambulatory Visit: Payer: Self-pay

## 2021-10-01 ENCOUNTER — Ambulatory Visit: Payer: Medicare HMO | Admitting: Family Medicine

## 2021-10-01 VITALS — BP 120/80 | HR 84 | Ht 66.93 in | Wt 192.6 lb

## 2021-10-01 DIAGNOSIS — G8929 Other chronic pain: Secondary | ICD-10-CM

## 2021-10-01 DIAGNOSIS — M25561 Pain in right knee: Secondary | ICD-10-CM

## 2021-10-01 DIAGNOSIS — M11261 Other chondrocalcinosis, right knee: Secondary | ICD-10-CM | POA: Diagnosis not present

## 2021-10-01 NOTE — Patient Instructions (Addendum)
Good to see you today.  You had a R knee aspiration and injection.  Call or go to the ER if you develop a large red swollen joint with extreme pain or oozing puss.   Follow-up: as needed

## 2021-10-01 NOTE — Progress Notes (Signed)
I, Colin Love, LAT, ATC, am serving as scribe for Dr. Lynne Leader.  Colin Love is a 76 y.o. male who presents to [facility] today for R knee pain.  He was last seen by Dr. Georgina Snell on 01/31/20 for f/u of lumbar radiculopathy.  Today, pt reports R knee pain x few months that has recently worsened over the last 2 days.  He locates his pain to his R ant knee.  R knee swelling: yes R knee mechanical symptoms: yes, R knee instability and giving-way Aggravating factors: walking/weight-bearing activity Treatments tried: R knee compression; Voltaren gel; Tylenol Arthritis   Pertinent review of systems: No fevers or chills  Relevant historical information: Atrial fibrillation.  History of bladder cancer.   Exam:  BP 120/80 (BP Location: Right Arm, Patient Position: Sitting, Cuff Size: Normal)   Pulse 84   Ht 5' 6.93" (1.7 m)   Wt 192 lb 9.6 oz (87.4 kg)   SpO2 97%   BMI 30.23 kg/m  General: Well Developed, well nourished, and in no acute distress.   MSK: Right knee: Moderate effusion.  Normal motion with crepitation.  Tender palpation medial joint line. Stable ligamentous exam.    Lab and Radiology Results  Procedure: Real-time Ultrasound Guided aspiration and injection of right knee superior lateral patellar space Device: Philips Affiniti 50G Images permanently stored and available for review in PACS Verbal informed consent obtained.  Discussed risks and benefits of procedure. Warned about infection, bleeding, hyperglycemia damage to structures among others. Patient expresses understanding and agreement Time-out conducted.   Noted no overlying erythema, induration, or other signs of local infection.   Skin prepped in a sterile fashion.   Local anesthesia: Topical Ethyl chloride.   With sterile technique and under real time ultrasound guidance: 2 mL of lidocaine injected into superior lateral knee achieving good anesthesia Skin again  sterilized with isopropyl alcohol. 18-gauge needle used to access the knee joint. 20 mL of clear yellow fluid aspirated. Syringe exchanged and 40 mg of Kenalog and 2 mL of Marcaine injected into the knee joint. Fluid seen entering the joint capsule..   Completed without difficulty   Pain immediately resolved suggesting accurate placement of the medication.   Advised to call if fevers/chills, erythema, induration, drainage, or persistent bleeding.   Images permanently stored and available for review in the ultrasound unit.  Impression: Technically successful ultrasound guided injection.   X-ray images right knee obtained today personally and independently interpreted. Moderate to severe medial compartment DJD.  Chondrocalcinosis present especially in the lateral compartment. Await formal radiology review      Assessment and Plan: 77 y.o. male with right knee pain thought to be due to exacerbation of DJD or potentially even pseudogout.  He does have chondrocalcinosis seen on x-ray.  Plan for aspiration and injection which did help pain immediately.  We will send fluid aspiration for cell count differential, crystal analysis and culture.  This should be helpful for determining pseudogout etc.  Steroid injection should be helpful for pain control.  Check back as needed.   PDMP not reviewed this encounter. Orders Placed This Encounter  Procedures   Anaerobic and Aerobic Culture    R knee joint fluid    Standing Status:   Future    Number of Occurrences:   1    Standing Expiration Date:   11/01/2021   [place] LIMITED JOINT SPACE STRUCTURES LOW RIGHT(NO LINKED CHARGES)    Order Specific Question:   Reason  for Exam (SYMPTOM  OR DIAGNOSIS REQUIRED)    Answer:   R knee pain    Order Specific Question:   Preferred imaging location?    Answer:   [place]   DG Knee AP/LAT W/[place] Right    Standing Status:   Future    Number of Occurrences:   1    Standing Expiration  Date:   11/01/2021    Order Specific Question:   Reason for Exam (SYMPTOM  OR DIAGNOSIS REQUIRED)    Answer:   R knee pain    Order Specific Question:   Preferred imaging location?    Answer:   [facility]   Synovial Fluid Analysis, Complete    R knee joint fluid    Standing Status:   Future    Number of Occurrences:   1    Standing Expiration Date:   11/01/2021   No orders of the defined types were placed in this encounter.    Discussed warning signs or symptoms. Please see discharge instructions. Patient expresses understanding.   The above documentation has been reviewed and is accurate and complete Lynne Leader, M.D.

## 2021-10-02 LAB — SYNOVIAL FLUID ANALYSIS, COMPLETE
Basophils, %: 0 %
Eosinophils-Synovial: 0 % (ref 0–2)
Lymphocytes-Synovial Fld: 47 % (ref 0–74)
Monocyte/Macrophage: 47 % (ref 0–69)
Neutrophil, Synovial: 6 % (ref 0–24)
Synoviocytes, %: 0 % (ref 0–15)
WBC, Synovial: 2809 {cells}/uL — ABNORMAL HIGH

## 2021-10-03 NOTE — Progress Notes (Signed)
No gout or pseudogout crystals are seen.  Culture is still pending but so far negative.

## 2021-10-03 NOTE — Progress Notes (Signed)
Medium to severe arthritis changes present in the right knee.

## 2021-10-07 LAB — ANAEROBIC AND AEROBIC CULTURE
AER RESULT:: NO GROWTH
MICRO NUMBER:: 13697234
MICRO NUMBER:: 13697235
SPECIMEN QUALITY:: ADEQUATE
SPECIMEN QUALITY:: ADEQUATE

## 2021-10-07 NOTE — Progress Notes (Signed)
No bacteria grew from the knee culture.

## 2021-10-13 ENCOUNTER — Ambulatory Visit (INDEPENDENT_AMBULATORY_CARE_PROVIDER_SITE_OTHER): Payer: Medicare HMO

## 2021-10-13 DIAGNOSIS — Z0000 Encounter for general adult medical examination without abnormal findings: Secondary | ICD-10-CM

## 2021-10-13 NOTE — Progress Notes (Signed)
Subjective:   Colin Love is a 76 y.o. male who presents for Medicare Annual/Subsequent preventive examination.  Review of Systems    Defer to PCP  I connected with  Dereck Ligas on 10/13/21 by a audio enabled telemedicine application and verified that I am speaking with the correct person using two identifiers.  Patient Location: Home  Provider Location: Office/Clinic  I discussed the limitations of evaluation and management by telemedicine. The patient expressed understanding and agreed to proceed.        Objective:    Today's Vitals   10/13/21 1548  PainSc: 6    There is no height or weight on file to calculate BMI.     04/24/2021    8:43 AM 10/10/2020   11:10 AM 03/16/2019   10:17 AM 05/27/2017   10:15 AM 11/26/2015    1:37 PM 08/29/2015   12:31 PM 02/09/2014    8:43 AM  Advanced Directives  Does Patient Have a Medical Advance Directive? No Yes Yes Yes Yes No;Yes Yes  Type of Advance Directive  Living will;Healthcare Power of Attorney Living will [place];Living will   Living will  Does patient want to make changes to medical advance directive?  No - Patient declined No - Patient declined    No - Patient declined  Copy of [place] in Chart?  No - copy requested  No - copy requested   No - copy requested  Would patient like information on creating a medical advance directive? No - Patient declined  No - Patient declined   No - patient declined information     Current Medications (verified) Outpatient Encounter Medications as of 10/13/2021  Medication Sig   ACCU-CHEK SOFTCLIX LANCETS lancets TEST UP TO 4 TIMES A DAY   acetaminophen (TYLENOL) 500 MG tablet Take 1,000 mg by mouth every 6 (six) hours as needed for mild pain.   apixaban (ELIQUIS) 5 MG TABS tablet TAKE 1 TABLET BY MOUTH TWICE A DAY   atorvastatin (LIPITOR) 20 MG tablet TAKE 1 TABLET BY MOUTH ONCE DAILY AT 6PM   blood glucose meter kit and supplies KIT Dispense  based on patient and insurance preference. Use up to four times daily as directed. (FOR ICD-9 250.00, 250.01).   diclofenac Sodium (VOLTAREN) 1 % GEL Apply 2 g topically daily as needed (pain).   diltiazem (CARDIZEM CD) 180 MG 24 hr capsule TAKE 1 CAPSULE BY MOUTH TWICE A DAY   gabapentin (NEURONTIN) 100 MG capsule Take 1-2 capsules (100-200 mg total) by mouth daily as needed.   latanoprost (XALATAN) 0.005 % ophthalmic solution Place 1 drop into both eyes at bedtime.    lisinopril-hydrochlorothiazide (ZESTORETIC) 20-12.5 MG tablet TAKE 1 TABLET BY MOUTH TWICE A DAY   meloxicam (MOBIC) 15 MG tablet Take 15 mg by mouth daily.   metFORMIN (GLUCOPHAGE) 500 MG tablet TAKE 1 TABLET BY MOUTH 2 TIMES DAILY WITH A MEAL.   ONE TOUCH ULTRA TEST test strip TEST UP TO 4 TIMES DAILY   potassium chloride (KLOR-CON) 10 MEQ tablet TAKE 3 TABLETS BY MOUTH DAILY   tamsulosin (FLOMAX) 0.4 MG CAPS capsule Take 0.4 mg by mouth daily.   terazosin (HYTRIN) 10 MG capsule TAKE 1 CAPSULE BY MOUTH EVERYDAY AT BEDTIME   Varenicline Tartrate, Starter, (CHANTIX STARTING MONTH PAK) 0.5 MG X 11 & 1 MG X 42 TBPK Take as directed   No facility-administered encounter medications on file as of 10/13/2021.    Allergies (  verified) Patient has no known allergies.   History: Past Medical History:  Diagnosis Date   A-fib ([place])    Adenoma of left adrenal gland    At risk for sleep apnea    STOP-BANG= 4    SENT TO PCP 02-06-2014   Bladder cancer ([place]) 02/06/2014   local, low grade pap uro carcinoma   BPH (benign prostatic hypertrophy)    Diverticulosis of colon    Full dentures    GERD (gastroesophageal reflux disease)    History of adenomatous polyp of colon    History of diverticulitis of colon    Hyperlipidemia    Hypertension    Murmur, cardiac    PAC (premature atrial contraction)    Type 2 diabetes mellitus [facility])    Past Surgical History:  Procedure Laterality Date   ANTERIOR CERVICAL DECOMP/DISCECTOMY FUSION   05-24-2003   C4  --  C7   CARDIOVERSION N/A 04/24/2021   Procedure: CARDIOVERSION;  Surgeon: Thayer Headings, MD;  Location: [place];  Service: Cardiovascular;  Laterality: N/A;   COLONOSCOPY W/ POLYPECTOMY  last one 07-24-2013 w/ EGD   CYSTOSCOPY W/ RETROGRADES Bilateral 02/09/2014   Procedure: BILATERAL RETROGRADE PYELOGRAM , Cystoscopy;  Surgeon: Ardis Hughs, MD;  Location: [facility];  Service: Urology;  Laterality: Bilateral;   TRANSURETHRAL RESECTION OF BLADDER TUMOR WITH GYRUS (TURBT-GYRUS) N/A 02/09/2014   Procedure: Bladder Biopsies;  Surgeon: Ardis Hughs, MD;  [facility];  Service: Urology;  Laterality: N/A;   Family History  Problem Relation Age of Onset   Colon cancer Mother    Hypertension Mother    Hyperlipidemia Mother    Hypertension Father    Hyperlipidemia Father    Diabetes Brother    Social History   Socioeconomic History   Marital status: Married    Spouse name: Not on file   Number of children: Not on file   Years of education: Not on file   Highest education level: Not on file  Occupational History   Not on file  Tobacco Use   Smoking status: Every Day    Packs/day: 0.50    Years: 40.00    Total pack years: 20.00    Types: Cigarettes   Smokeless tobacco: Never  Vaping Use   Vaping Use: Never used  Substance and Sexual Activity   Alcohol use: Yes    Comment: OCCASIONAL   Drug use: No   Sexual activity: Not Currently  Other Topics Concern   Not on file  Social History Narrative   Not on file   Social Determinants of Health   Financial Resource Strain: Low Risk  (10/10/2020)   Overall Financial Resource Strain (CARDIA)    Difficulty of Paying Living Expenses: Not hard at all  Food Insecurity: No Food Insecurity (10/10/2020)   Hunger Vital Sign    Worried About Running Out of Food in the Last Year: Never true    [place] in the Last Year: Never true  Transportation Needs: No  Transportation Needs (10/10/2020)   PRAPARE - Hydrologist (Medical): No    Lack of Transportation (Non-Medical): No  Physical Activity: Sufficiently Active (10/10/2020)   Exercise Vital Sign    Days of Exercise per Week: 1 day    Minutes of Exercise per Session: 150+ min  Stress: No Stress Concern Present (10/10/2020)   [place]    Feeling of  Stress : Not at all  Social Connections: Socially Integrated (10/10/2020)   Social Connection and Isolation Panel [NHANES]    Frequency of Communication with Friends and Family: More than three times a week    Frequency of Social Gatherings with Friends and Family: More than three times a week    Attends Religious Services: More than 4 times per year    Active Member of [employer] or Organizations: Yes    Attends Music therapist: More than 4 times per year    Marital Status: Married    Tobacco Counseling Ready to quit: Not Answered Counseling given: Not Answered   Clinical Intake:  Pre-visit preparation completed: Yes  Pain : 0-10 Pain Score: 6  Pain Type: Chronic pain Pain Location: Back Pain Orientation: Lower Pain Descriptors / Indicators: Aching Pain Onset: Other (comment) Pain Frequency: Constant     Nutritional Status: BMI > 30  Obese Nutritional Risks: None Diabetes: Yes CBG done?: Yes (128) CBG resulted in Enter/ Edit results?: No Did pt. bring in CBG monitor from home?: No Glucose Meter Downloaded?: No  How often do you need to have someone help you when you read instructions, pamphlets, or other written materials from your doctor or pharmacy?: 1 - Never  Diabetic? Yes  Interpreter Needed?: No      Activities of Daily Living    10/13/2021    3:47 PM  In your present state of health, do you have any difficulty performing the following activities:  Hearing? 1  Vision? 0  Difficulty concentrating or making  decisions? 0  Walking or climbing stairs? 0  Dressing or bathing? 0  Doing errands, shopping? 0    Patient Care Team: Binnie Rail, MD as PCP - General (Internal Medicine) Jerline Pain, MD as PCP - Cardiology (Cardiology) Juanita Craver, MD as Consulting Physician (Gastroenterology) Ardis Hughs, MD (Urology) Jerline Pain, MD (Cardiology) Charlton Haws, [facility] as Pharmacist (Pharmacist) Katy Apo, MD as Consulting Physician (Ophthalmology)  Indicate any recent Medical Services you may have received from other than [facility] providers in the past year (date may be approximate).     Assessment:   This is a routine wellness examination for Ramari.  Hearing/Vision screen No results found.  Dietary issues and exercise activities discussed: Current Exercise Habits: The patient does not participate in regular exercise at present, Exercise limited by: None identified;Other - see comments (back pain)   Depression Screen    10/13/2021    3:46 PM 10/10/2020   11:08 AM 02/07/2020    9:59 AM 02/07/2019    9:23 AM 08/04/2018    9:47 AM 05/27/2017   10:15 AM 11/26/2016    9:01 AM  PHQ 2/9 Scores  PHQ - 2 Score 0 0 0 0 0 1 0  PHQ- 9 Score 0     1   Exception Documentation Patient refusal          Fall Risk    10/13/2021    3:46 PM 10/10/2020   11:11 AM 02/07/2020    9:59 AM 08/08/2019   10:02 AM 02/07/2019    9:22 AM  Fall Risk   Falls in the past year? 0 0 0  0  Number falls in past yr: 0 0 0 0 0  Injury with Fall? 1 0 0 0   Risk for fall due to : No Fall Risks No Fall Risks No Fall Risks    Follow up Education provided Falls evaluation completed Falls evaluation  completed Falls evaluation completed     FALL RISK PREVENTION PERTAINING TO THE HOME:  Any stairs in or around the home? No  If so, are there any without handrails?  NA Home free of loose throw rugs in walkways, pet beds, electrical cords, etc? Yes  Adequate lighting in your home to reduce risk of falls? Yes    ASSISTIVE DEVICES UTILIZED TO PREVENT FALLS:  Life alert? No  Use of a cane, walker or w/c? No  Grab bars in the bathroom? Yes  Shower chair or bench in shower? Yes  Elevated toilet seat or a handicapped toilet? No   TIMED UP AND GO:  Was the test performed? No .  Length of time to ambulate 10 feet: Unable to determine   Gait steady and fast without use of assistive device  Cognitive Function:    05/27/2017   10:29 AM  MMSE - Mini Mental State Exam  Orientation to time 5  Orientation to Place 5  Registration 3  Attention/ Calculation 5  Recall 3  Language- name 2 objects 2  Language- repeat 1  Language- follow 3 step command 3  Language- read & follow direction 1  Write a sentence 1  Copy design 1  Total score 30        Immunizations Immunization History  Administered Date(s) Administered   Fluad Quad(high Dose 65+) 02/07/2019, 02/07/2020, 02/11/2021   Influenza Split 03/20/2011   Influenza, High Dose Seasonal PF 01/30/2013, 01/09/2014, 11/13/2014, 11/26/2015, 11/26/2016, 11/29/2017   Influenza, Seasonal, Injecte, Preservative Fre 03/17/2012   PFIZER(Purple Top)SARS-COV-2 Vaccination 04/20/2019, 05/15/2019, 02/06/2020   Pneumococcal Conjugate-13 11/13/2014   Pneumococcal Polysaccharide-23 03/20/2011   Tdap 07/02/2010    TDAP status: Due, Education has been provided regarding the importance of this vaccine. Advised may receive this vaccine at local pharmacy or Health Dept. Aware to provide a copy of the vaccination record if obtained from local pharmacy or Health Dept. Verbalized acceptance and understanding.  Flu Vaccine status: Due, Education has been provided regarding the importance of this vaccine. Advised may receive this vaccine at local pharmacy or Health Dept. Aware to provide a copy of the vaccination record if obtained from local pharmacy or Health Dept. Verbalized acceptance and understanding.  Pneumococcal Vaccine: Completed   Covid-19 vaccine  status: Completed vaccines  Qualifies for Shingles Vaccine? Yes   Zostavax completed No   Shingrix Completed?: No.    Education has been provided regarding the importance of this vaccine. Patient has been advised to call insurance company to determine out of pocket expense if they have not yet received this vaccine. Advised may also receive vaccine at local pharmacy or Health Dept. Verbalized acceptance and understanding.  Screening Tests Health Maintenance  Topic Date Due   TETANUS/TDAP  07/01/2020   OPHTHALMOLOGY EXAM  08/01/2021   INFLUENZA VACCINE  10/07/2021   FOOT EXAM  10/14/2021 (Originally 08/04/2019)   Zoster Vaccines- Shingrix (1 of 2) 01/13/2022 (Originally 12/12/1964)   HEMOGLOBIN A1C  02/11/2022   Diabetic kidney evaluation - GFR measurement  08/13/2022   Diabetic kidney evaluation - Urine ACR  08/13/2022   Pneumonia Vaccine 46+ Years old  Completed   Hepatitis C Screening  Completed   HPV VACCINES  Aged Out   COLONOSCOPY (Pts 45-69yr Insurance coverage will need to be confirmed)  Discontinued   COVID-19 Vaccine  Discontinued    Health Maintenance  Health Maintenance Due  Topic Date Due   TETANUS/TDAP  07/01/2020   OPHTHALMOLOGY EXAM  08/01/2021  INFLUENZA VACCINE  10/07/2021      Lung Cancer Screening: (Low Dose CT Chest recommended if Age 37-80 years, 30 pack-year currently smoking OR have quit w/in 15years.) does qualify.   Lung Cancer Screening Referral: NA  Additional Screening:  Hepatitis C Screening: does not qualify; Completed 08/07/2015  Vision Screening: Recommended annual ophthalmology exams for early detection of glaucoma and other disorders of the eye. Is the patient up to date with their annual eye exam?  Yes  Who is the provider or what is the name of the office in which the patient attends annual eye exams? Dr. Prudencio Burly If pt is not established with a provider, would they like to be referred to a provider to establish care? No .   Dental  Screening: Recommended annual dental exams for proper oral hygiene  Community Resource Referral / Chronic Care Management: CRR required this visit?  No   CCM required this visit?  No      Plan:     I have personally reviewed and noted the following in the patient's chart:   Medical and social history Use of alcohol, tobacco or illicit drugs  Current medications and supplements including opioid prescriptions. Patient is not currently taking opioid prescriptions. Functional ability and status Nutritional status Physical activity Advanced directives List of other physicians Hospitalizations, surgeries, and ER visits in previous 12 months Vitals Screenings to include cognitive, depression, and falls Referrals and appointments  In addition, I have reviewed and discussed with patient certain preventive protocols, quality metrics, and best practice recommendations. A written personalized care plan for preventive services as well as general preventive health recommendations were provided to patient.     Henrene Dodge, RN   10/13/2021   Nurse Notes: None face to face 30 minutes  Mr. Bankhead ,  Thank you for taking time to come for your Medicare Wellness Visit. I appreciate your ongoing commitment to your health goals. Please review the following plan we discussed and let me know if I can assist you in the future.   These are the goals we discussed:  Goals      Patient Stated     Increase my physical activity by starting to walk twice a week.      Quit smoking / using tobacco     Smoking;  Educated to avoid secondary smoke Smoking cessation in [place]: [place] at [phone]  Classes offered a couple of times a month;  Will work with the patient as far as registration and location  Meds may help; chatix (Varenicline); Zyban (Bupropion SR); Nicotine Replacement (gum; lozenges; patches; etc.)   30 pack yr smoking hx: Educated regarding LDCT; To discuss with  MD at next fup. Also educated on AAA screening for men 65-75 who have smoked         This is a list of the screening recommended for you and due dates:  Health Maintenance  Topic Date Due   Tetanus Vaccine  07/01/2020   Eye exam for diabetics  08/01/2021   Flu Shot  10/07/2021   Complete foot exam   10/14/2021*   Zoster (Shingles) Vaccine (1 of 2) 01/13/2022*   Hemoglobin A1C  02/11/2022   Yearly kidney function blood test for diabetes  08/13/2022   Yearly kidney health urinalysis for diabetes  08/13/2022   Pneumonia Vaccine  Completed   Hepatitis C Screening: USPSTF Recommendation to screen - Ages 36-79 yo.  Completed   HPV Vaccine  Aged Out   Colon  Cancer Screening  Discontinued   COVID-19 Vaccine  Discontinued  *Topic was postponed. The date shown is not the original due date.

## 2021-10-13 NOTE — Patient Instructions (Signed)
Health Maintenance, Male Adopting a healthy lifestyle and getting preventive care are important in promoting health and wellness. Ask your health care provider about: The right schedule for you to have regular tests and exams. Things you can do on your own to prevent diseases and keep yourself healthy. What should I know about diet, weight, and exercise? Eat a healthy diet  Eat a diet that includes plenty of vegetables, fruits, low-fat dairy products, and lean protein. Do not eat a lot of foods that are high in solid fats, added sugars, or sodium. Maintain a healthy weight Body mass index (BMI) is a measurement that can be used to identify possible weight problems. It estimates body fat based on height and weight. Your health care provider can help determine your BMI and help you achieve or maintain a healthy weight. Get regular exercise Get regular exercise. This is one of the most important things you can do for your health. Most adults should: Exercise for at least 150 minutes each week. The exercise should increase your heart rate and make you sweat (moderate-intensity exercise). Do strengthening exercises at least twice a week. This is in addition to the moderate-intensity exercise. Spend less time sitting. Even light physical activity can be beneficial. Watch cholesterol and blood lipids Have your blood tested for lipids and cholesterol at 77 years of age, then have this test every 5 years. You may need to have your cholesterol levels checked more often if: Your lipid or cholesterol levels are high. You are older than 77 years of age. You are at high risk for heart disease. What should I know about cancer screening? Many types of cancers can be detected early and may often be prevented. Depending on your health history and family history, you may need to have cancer screening at various ages. This may include screening for: Colorectal cancer. Prostate cancer. Skin cancer. Lung  cancer. What should I know about heart disease, diabetes, and high blood pressure? Blood pressure and heart disease High blood pressure causes heart disease and increases the risk of stroke. This is more likely to develop in people who have high blood pressure readings or are overweight. Talk with your health care provider about your target blood pressure readings. Have your blood pressure checked: Every 3-5 years if you are 18-39 years of age. Every year if you are 40 years old or older. If you are between the ages of 65 and 75 and are a current or former smoker, ask your health care provider if you should have a one-time screening for abdominal aortic aneurysm (AAA). Diabetes Have regular diabetes screenings. This checks your fasting blood sugar level. Have the screening done: Once every three years after age 45 if you are at a normal weight and have a low risk for diabetes. More often and at a younger age if you are overweight or have a high risk for diabetes. What should I know about preventing infection? Hepatitis B If you have a higher risk for hepatitis B, you should be screened for this virus. Talk with your health care provider to find out if you are at risk for hepatitis B infection. Hepatitis C Blood testing is recommended for: Everyone born from 1945 through 1965. Anyone with known risk factors for hepatitis C. Sexually transmitted infections (STIs) You should be screened each year for STIs, including gonorrhea and chlamydia, if: You are sexually active and are younger than 77 years of age. You are older than 77 years of age and your   health care provider tells you that you are at risk for this type of infection. Your sexual activity has changed since you were last screened, and you are at increased risk for chlamydia or gonorrhea. Ask your health care provider if you are at risk. Ask your health care provider about whether you are at high risk for HIV. Your health care provider  may recommend a prescription medicine to help prevent HIV infection. If you choose to take medicine to prevent HIV, you should first get tested for HIV. You should then be tested every 3 months for as long as you are taking the medicine. Follow these instructions at home: Alcohol use Do not drink alcohol if your health care provider tells you not to drink. If you drink alcohol: Limit how much you have to 0-2 drinks a day. Know how much alcohol is in your drink. In the [place] one drink equals one 12 oz bottle of beer (355 mL), one 5 oz glass of wine (148 mL), or one 1 oz glass of hard liquor (44 mL). Lifestyle Do not use any products that contain nicotine or tobacco. These products include cigarettes, chewing tobacco, and vaping devices, such as e-cigarettes. If you need help quitting, ask your health care provider. Do not use street drugs. Do not share needles. Ask your health care provider for help if you need support or information about quitting drugs. General instructions Schedule regular health, dental, and eye exams. Stay current with your vaccines. Tell your health care provider if: You often feel depressed. You have ever been abused or do not feel safe at home. Summary Adopting a healthy lifestyle and getting preventive care are important in promoting health and wellness. Follow your health care provider's instructions about healthy diet, exercising, and getting tested or screened for diseases. Follow your health care provider's instructions on monitoring your cholesterol and blood pressure. This information is not intended to replace advice given to you by your health care provider. Make sure you discuss any questions you have with your health care provider. Document Revised: 07/15/2020 Document Reviewed: 07/15/2020 Elsevier Patient Education  2023 [employer]  

## 2021-10-21 NOTE — Progress Notes (Cosign Needed Addendum)
Subjective:   Colin Love is a 76 y.o. male who presents for Medicare Annual/Subsequent preventive examination.  Review of Systems    Defer to PCP  I connected with  Dereck Ligas on 10/13/2021 by a audio enabled telemedicine application and verified that I am speaking with the correct person using two identifiers.  Patient Location: Home  Provider Location: Office/Clinic  I discussed the limitations of evaluation and management by telemedicine. The patient expressed understanding and agreed to proceed.        Objective:    Today's Vitals   10/13/21 1548  PainSc: 6    There is no height or weight on file to calculate BMI.     10/13/2021    3:50 PM 04/24/2021    8:43 AM 10/10/2020   11:10 AM 03/16/2019   10:17 AM 05/27/2017   10:15 AM 11/26/2015    1:37 PM 08/29/2015   12:31 PM  Advanced Directives  Does Patient Have a Medical Advance Directive? Yes No Yes Yes Yes Yes No;Yes  Type of Paramedic of [place];Living will  Living will;Healthcare Power of Attorney Living will [place];Living will    Does patient want to make changes to medical advance directive? No - Patient declined  No - Patient declined No - Patient declined     Copy of [place] in Chart? No - copy requested  No - copy requested  No - copy requested    Would patient like information on creating a medical advance directive?  No - Patient declined  No - Patient declined   No - patient declined information    Current Medications (verified) Outpatient Encounter Medications as of 10/13/2021  Medication Sig   ACCU-CHEK SOFTCLIX LANCETS lancets TEST UP TO 4 TIMES A DAY   acetaminophen (TYLENOL) 500 MG tablet Take 1,000 mg by mouth every 6 (six) hours as needed for mild pain.   apixaban (ELIQUIS) 5 MG TABS tablet TAKE 1 TABLET BY MOUTH TWICE A DAY   atorvastatin (LIPITOR) 20 MG tablet TAKE 1 TABLET BY MOUTH ONCE DAILY AT 6PM   blood glucose meter kit  and supplies KIT Dispense based on patient and insurance preference. Use up to four times daily as directed. (FOR ICD-9 250.00, 250.01).   diclofenac Sodium (VOLTAREN) 1 % GEL Apply 2 g topically daily as needed (pain).   diltiazem (CARDIZEM CD) 180 MG 24 hr capsule TAKE 1 CAPSULE BY MOUTH TWICE A DAY   gabapentin (NEURONTIN) 100 MG capsule Take 1-2 capsules (100-200 mg total) by mouth daily as needed.   latanoprost (XALATAN) 0.005 % ophthalmic solution Place 1 drop into both eyes at bedtime.    lisinopril-hydrochlorothiazide (ZESTORETIC) 20-12.5 MG tablet TAKE 1 TABLET BY MOUTH TWICE A DAY   meloxicam (MOBIC) 15 MG tablet Take 15 mg by mouth daily.   metFORMIN (GLUCOPHAGE) 500 MG tablet TAKE 1 TABLET BY MOUTH 2 TIMES DAILY WITH A MEAL.   ONE TOUCH ULTRA TEST test strip TEST UP TO 4 TIMES DAILY   potassium chloride (KLOR-CON) 10 MEQ tablet TAKE 3 TABLETS BY MOUTH DAILY   tamsulosin (FLOMAX) 0.4 MG CAPS capsule Take 0.4 mg by mouth daily.   terazosin (HYTRIN) 10 MG capsule TAKE 1 CAPSULE BY MOUTH EVERYDAY AT BEDTIME   Varenicline Tartrate, Starter, (CHANTIX STARTING MONTH PAK) 0.5 MG X 11 & 1 MG X 42 TBPK Take as directed   No facility-administered encounter medications on file as of 10/13/2021.  Allergies (verified) Patient has no known allergies.   History: Past Medical History:  Diagnosis Date   A-fib ([place])    Adenoma of left adrenal gland    At risk for sleep apnea    STOP-BANG= 4    SENT TO PCP 02-06-2014   Bladder cancer ([place]) 02/06/2014   local, low grade pap uro carcinoma   BPH (benign prostatic hypertrophy)    Diverticulosis of colon    Full dentures    GERD (gastroesophageal reflux disease)    History of adenomatous polyp of colon    History of diverticulitis of colon    Hyperlipidemia    Hypertension    Murmur, cardiac    PAC (premature atrial contraction)    Type 2 diabetes mellitus [facility])    Past Surgical History:  Procedure Laterality Date   ANTERIOR CERVICAL  DECOMP/DISCECTOMY FUSION  05-24-2003   C4  --  C7   CARDIOVERSION N/A 04/24/2021   Procedure: CARDIOVERSION;  Surgeon: Thayer Headings, MD;  Location: [facility];  Service: Cardiovascular;  Laterality: N/A;   COLONOSCOPY W/ POLYPECTOMY  last one 07-24-2013 w/ EGD   CYSTOSCOPY W/ RETROGRADES Bilateral 02/09/2014   Procedure: BILATERAL RETROGRADE PYELOGRAM , Cystoscopy;  Surgeon: Ardis Hughs, MD;  Location[facility];  Service: Urology;  Laterality: Bilateral;   TRANSURETHRAL RESECTION OF BLADDER TUMOR WITH GYRUS (TURBT-GYRUS) N/A 02/09/2014   Procedure: Bladder Biopsies;  Surgeon: Ardis Hughs, MD;  Location[facility];  Service: Urology;  Laterality: N/A;   Family History  Problem Relation Age of Onset   Colon cancer Mother    Hypertension Mother    Hyperlipidemia Mother    Hypertension Father    Hyperlipidemia Father    Diabetes Brother    Social History   Socioeconomic History   Marital status: Married    Spouse name: Not on file   Number of children: Not on file   Years of education: Not on file   Highest education level: Not on file  Occupational History   Not on file  Tobacco Use   Smoking status: Every Day    Packs/day: 0.50    Years: 40.00    Total pack years: 20.00    Types: Cigarettes   Smokeless tobacco: Never  Vaping Use   Vaping Use: Never used  Substance and Sexual Activity   Alcohol use: Yes    Comment: OCCASIONAL   Drug use: No   Sexual activity: Not Currently  Other Topics Concern   Not on file  Social History Narrative   Not on file   Social Determinants of Health   Financial Resource Strain: Low Risk  (10/10/2020)   Overall Financial Resource Strain (CARDIA)    Difficulty of Paying Living Expenses: Not hard at all  Food Insecurity: No Food Insecurity (10/10/2020)   Hunger Vital Sign    Worried About Running Out of Food in the Last Year: Never true    [place] in the Last Year: Never true   Transportation Needs: No Transportation Needs (10/10/2020)   PRAPARE - Hydrologist (Medical): No    Lack of Transportation (Non-Medical): No  Physical Activity: Sufficiently Active (10/10/2020)   Exercise Vital Sign    Days of Exercise per Week: 1 day    Minutes of Exercise per Session: 150+ min  Stress: No Stress Concern Present (10/10/2020)   [place]  of Stress : Not at all  Social Connections: Socially Integrated (10/10/2020)   Social Connection and Isolation Panel [NHANES]    Frequency of Communication with Friends and Family: More than three times a week    Frequency of Social Gatherings with Friends and Family: More than three times a week    Attends Religious Services: More than 4 times per year    Active Member of [employer] or Organizations: Yes    Attends Music therapist: More than 4 times per year    Marital Status: Married    Tobacco Counseling Ready to quit: Not Answered Counseling given: Not Answered   Clinical Intake:  Pre-visit preparation completed: Yes  Pain : 0-10 Pain Score: 6  Pain Type: Chronic pain Pain Location: Back Pain Orientation: Lower Pain Descriptors / Indicators: Aching Pain Onset: Other (comment) Pain Frequency: Constant     Nutritional Status: BMI > 30  Obese Nutritional Risks: None Diabetes: Yes CBG done?: Yes (128) CBG resulted in Enter/ Edit results?: No Did pt. bring in CBG monitor from home?: No Glucose Meter Downloaded?: No  How often do you need to have someone help you when you read instructions, pamphlets, or other written materials from your doctor or pharmacy?: 1 - Never  Diabetic? Yes  Interpreter Needed?: No      Activities of Daily Living    10/13/2021    3:47 PM  In your present state of health, do you have any difficulty performing the following activities:  Hearing? 1  Vision? 0  Difficulty  concentrating or making decisions? 0  Walking or climbing stairs? 0  Dressing or bathing? 0  Doing errands, shopping? 0    Patient Care Team: Binnie Rail, MD as PCP - General (Internal Medicine) Jerline Pain, MD as PCP - Cardiology (Cardiology) Juanita Craver, MD as Consulting Physician (Gastroenterology) Ardis Hughs, MD (Urology) Jerline Pain, MD (Cardiology) Charlton Haws, [facility]: Lambert Rhonda W as Pharmacist (Pharmacist) Katy Apo, MD as Consulting Physician (Ophthalmology)  Indicate any recent Medical Services you may have received from other than [facility] providers in the past year (date may be approximate).     Assessment:   This is a routine wellness examination for Colin Love.  Hearing/Vision screen No results found.  Dietary issues and exercise activities discussed: Current Exercise Habits: The patient does not participate in regular exercise at present, Exercise limited by: None identified;Other - see comments (back pain)   Depression Screen    10/13/2021    3:46 PM 10/10/2020   11:08 AM 02/07/2020    9:59 AM 02/07/2019    9:23 AM 08/04/2018    9:47 AM 05/27/2017   10:15 AM 11/26/2016    9:01 AM  PHQ 2/9 Scores  PHQ - 2 Score 0 0 0 0 0 1 0  PHQ- 9 Score 0     1   Exception Documentation Patient refusal          Fall Risk    10/13/2021    3:46 PM 10/10/2020   11:11 AM 02/07/2020    9:59 AM 08/08/2019   10:02 AM 02/07/2019    9:22 AM  Fall Risk   Falls in the past year? 0 0 0  0  Number falls in past yr: 0 0 0 0 0  Injury with Fall? 1 0 0 0   Risk for fall due to : No Fall Risks No Fall Risks No Fall Risks    Follow up Education provided Falls evaluation completed Falls  evaluation completed Falls evaluation completed     FALL RISK PREVENTION PERTAINING TO THE HOME:  Any stairs in or around the home? No  If so, are there any without handrails?  NA Home free of loose throw rugs in walkways, pet beds, electrical cords, etc? Yes  Adequate lighting in your home to  reduce risk of falls? Yes   ASSISTIVE DEVICES UTILIZED TO PREVENT FALLS:  Life alert? No  Use of a cane, walker or w/c? No  Grab bars in the bathroom? Yes  Shower chair or bench in shower? Yes  Elevated toilet seat or a handicapped toilet? No   TIMED UP AND GO:  Was the test performed? No .  Length of time to ambulate 10 feet: Unable to determine   Gait steady and fast without use of assistive device  Cognitive Function: Patient sounded and answered questions and was alert and oriented.      Immunizations Immunization History  Administered Date(s) Administered   Fluad Quad(high Dose 65+) 02/07/2019, 02/07/2020, 02/11/2021   Influenza Split 03/20/2011   Influenza, High Dose Seasonal PF 01/30/2013, 01/09/2014, 11/13/2014, 11/26/2015, 11/26/2016, 11/29/2017   Influenza, Seasonal, Injecte, Preservative Fre 03/17/2012   PFIZER(Purple Top)SARS-COV-2 Vaccination 04/20/2019, 05/15/2019, 02/06/2020   Pneumococcal Conjugate-13 11/13/2014   Pneumococcal Polysaccharide-23 03/20/2011   Tdap 07/02/2010    TDAP status: Due, Education has been provided regarding the importance of this vaccine. Advised may receive this vaccine at local pharmacy or Health Dept. Aware to provide a copy of the vaccination record if obtained from local pharmacy or Health Dept. Verbalized acceptance and understanding.  Flu Vaccine status: Due, Education has been provided regarding the importance of this vaccine. Advised may receive this vaccine at local pharmacy or Health Dept. Aware to provide a copy of the vaccination record if obtained from local pharmacy or Health Dept. Verbalized acceptance and understanding.  Pneumococcal Vaccine: Completed   Covid-19 vaccine status: Completed vaccines  Qualifies for Shingles Vaccine? Yes   Zostavax completed No   Shingrix Completed?: No.    Education has been provided regarding the importance of this vaccine. Patient has been advised to call insurance company to  determine out of pocket expense if they have not yet received this vaccine. Advised may also receive vaccine at local pharmacy or Health Dept. Verbalized acceptance and understanding.  Screening Tests Health Maintenance  Topic Date Due   FOOT EXAM  08/04/2019   TETANUS/TDAP  07/01/2020   OPHTHALMOLOGY EXAM  08/01/2021   INFLUENZA VACCINE  10/07/2021   Zoster Vaccines- Shingrix (1 of 2) 01/13/2022 (Originally 12/12/1964)   HEMOGLOBIN A1C  02/11/2022   Diabetic kidney evaluation - GFR measurement  08/13/2022   Diabetic kidney evaluation - Urine ACR  08/13/2022   Pneumonia Vaccine 33+ Years old  Completed   Hepatitis C Screening  Completed   HPV VACCINES  Aged Out   COLONOSCOPY (Pts 45-56yrs Insurance coverage will need to be confirmed)  Discontinued   COVID-19 Vaccine  Discontinued    Health Maintenance  Health Maintenance Due  Topic Date Due   FOOT EXAM  08/04/2019   TETANUS/TDAP  07/01/2020   OPHTHALMOLOGY EXAM  08/01/2021   INFLUENZA VACCINE  10/07/2021      Lung Cancer Screening: (Low Dose CT Chest recommended if Age 42-80 years, 30 pack-year currently smoking OR have quit w/in 15years.) does qualify.   Lung Cancer Screening Referral: NA  Additional Screening:  Hepatitis C Screening: does not qualify; Completed 08/07/2015  Vision Screening: Recommended annual ophthalmology exams for  early detection of glaucoma and other disorders of the eye. Is the patient up to date with their annual eye exam?  Yes  Who is the provider or what is the name of the office in which the patient attends annual eye exams? Dr. Prudencio Burly If pt is not established with a provider, would they like to be referred to a provider to establish care? No .   Dental Screening: Recommended annual dental exams for proper oral hygiene  Community Resource Referral / Chronic Care Management: CRR required this visit?  No   CCM required this visit?  No      Plan:     I have personally reviewed and noted  the following in the patient's chart:   Medical and social history Use of alcohol, tobacco or illicit drugs  Current medications and supplements including opioid prescriptions. Patient is not currently taking opioid prescriptions. Functional ability and status Nutritional status Physical activity Advanced directives List of other physicians Hospitalizations, surgeries, and ER visits in previous 12 months Vitals Screenings to include cognitive, depression, and falls Referrals and appointments  In addition, I have reviewed and discussed with patient certain preventive protocols, quality metrics, and best practice recommendations. A written personalized care plan for preventive services as well as general preventive health recommendations were provided to patient.     Henrene Dodge, RN   10/13/2021    Nurse Notes: None face to face 30 minutes  Colin Love ,  Thank you for taking time to come for your Medicare Wellness Visit. I appreciate your ongoing commitment to your health goals. Please review the following plan we discussed and let me know if I can assist you in the future.   These are the goals we discussed:  Goals      Patient Stated     Increase my physical activity by starting to walk twice a week.      Quit smoking / using tobacco     Smoking;  Educated to avoid secondary smoke Smoking cessation in [place]: [place] at [phone] -[phone]  Classes offered a couple of times a month;  Will work with the patient as far as registration and location  Meds may help; chatix (Varenicline); Zyban (Bupropion SR); Nicotine Replacement (gum; lozenges; patches; etc.)   30 pack yr smoking hx: Educated regarding LDCT; To discuss with MD at next fup. Also educated on AAA screening for men 65-75 who have smoked         This is a list of the screening recommended for you and due dates:  Health Maintenance  Topic Date Due   Complete foot exam   08/04/2019   Tetanus  Vaccine  07/01/2020   Eye exam for diabetics  08/01/2021   Flu Shot  10/07/2021   Zoster (Shingles) Vaccine (1 of 2) 01/13/2022*   Hemoglobin A1C  02/11/2022   Yearly kidney function blood test for diabetes  08/13/2022   Yearly kidney health urinalysis for diabetes  08/13/2022   Pneumonia Vaccine  Completed   Hepatitis C Screening: USPSTF Recommendation to screen - Ages 56-79 yo.  Completed   HPV Vaccine  Aged Out   Colon Cancer Screening  Discontinued   COVID-19 Vaccine  Discontinued  *Topic was postponed. The date shown is not the original due date.

## 2021-10-23 NOTE — Progress Notes (Signed)
Subjective:   Colin Love is a 76 y.o. male who presents for Medicare Annual/Subsequent preventive examination.  Review of Systems    Defer to PCP  I connected with  Dereck Ligas on 10/13/2021 by a audio enabled telemedicine application and verified that I am speaking with the correct person using two identifiers.  Patient Location: Home  Provider Location: Office/Clinic  I discussed the limitations of evaluation and management by telemedicine. The patient expressed understanding and agreed to proceed.        Objective:    Today's Vitals   10/13/21 1548  PainSc: 6    There is no height or weight on file to calculate BMI.     10/13/2021    3:50 PM 04/24/2021    8:43 AM 10/10/2020   11:10 AM 03/16/2019   10:17 AM 05/27/2017   10:15 AM 11/26/2015    1:37 PM 08/29/2015   12:31 PM  Advanced Directives  Does Patient Have a Medical Advance Directive? Yes No Yes Yes Yes Yes No;Yes  Type of Paramedic of [place];Living will  Living will;Healthcare Power of Attorney Living will [place];Living will    Does patient want to make changes to medical advance directive? No - Patient declined  No - Patient declined No - Patient declined     Copy of [place] in Chart? No - copy requested  No - copy requested  No - copy requested    Would patient like information on creating a medical advance directive?  No - Patient declined  No - Patient declined   No - patient declined information    Current Medications (verified) Outpatient Encounter Medications as of 10/13/2021  Medication Sig   ACCU-CHEK SOFTCLIX LANCETS lancets TEST UP TO 4 TIMES A DAY   acetaminophen (TYLENOL) 500 MG tablet Take 1,000 mg by mouth every 6 (six) hours as needed for mild pain.   apixaban (ELIQUIS) 5 MG TABS tablet TAKE 1 TABLET BY MOUTH TWICE A DAY   atorvastatin (LIPITOR) 20 MG tablet TAKE 1 TABLET BY MOUTH ONCE DAILY AT 6PM   blood glucose meter kit  and supplies KIT Dispense based on patient and insurance preference. Use up to four times daily as directed. (FOR ICD-9 250.00, 250.01).   diclofenac Sodium (VOLTAREN) 1 % GEL Apply 2 g topically daily as needed (pain).   diltiazem (CARDIZEM CD) 180 MG 24 hr capsule TAKE 1 CAPSULE BY MOUTH TWICE A DAY   gabapentin (NEURONTIN) 100 MG capsule Take 1-2 capsules (100-200 mg total) by mouth daily as needed.   latanoprost (XALATAN) 0.005 % ophthalmic solution Place 1 drop into both eyes at bedtime.    lisinopril-hydrochlorothiazide (ZESTORETIC) 20-12.5 MG tablet TAKE 1 TABLET BY MOUTH TWICE A DAY   meloxicam (MOBIC) 15 MG tablet Take 15 mg by mouth daily.   metFORMIN (GLUCOPHAGE) 500 MG tablet TAKE 1 TABLET BY MOUTH 2 TIMES DAILY WITH A MEAL.   ONE TOUCH ULTRA TEST test strip TEST UP TO 4 TIMES DAILY   potassium chloride (KLOR-CON) 10 MEQ tablet TAKE 3 TABLETS BY MOUTH DAILY   tamsulosin (FLOMAX) 0.4 MG CAPS capsule Take 0.4 mg by mouth daily.   terazosin (HYTRIN) 10 MG capsule TAKE 1 CAPSULE BY MOUTH EVERYDAY AT BEDTIME   Varenicline Tartrate, Starter, (CHANTIX STARTING MONTH PAK) 0.5 MG X 11 & 1 MG X 42 TBPK Take as directed   No facility-administered encounter medications on file as of 10/13/2021.  Allergies (verified) Patient has no known allergies.   History: Past Medical History:  Diagnosis Date   A-fib ([place])    Adenoma of left adrenal gland    At risk for sleep apnea    STOP-BANG= 4    SENT TO PCP 02-06-2014   Bladder cancer ([place]) 02/06/2014   local, low grade pap uro carcinoma   BPH (benign prostatic hypertrophy)    Diverticulosis of colon    Full dentures    GERD (gastroesophageal reflux disease)    History of adenomatous polyp of colon    History of diverticulitis of colon    Hyperlipidemia    Hypertension    Murmur, cardiac    PAC (premature atrial contraction)    Type 2 diabetes mellitus [facility])    Past Surgical History:  Procedure Laterality Date   ANTERIOR CERVICAL  DECOMP/DISCECTOMY FUSION  05-24-2003   C4  --  C7   CARDIOVERSION N/A 04/24/2021   Procedure: CARDIOVERSION;  Surgeon: Thayer Headings, MD;  Location: [facility];  Service: Cardiovascular;  Laterality: N/A;   COLONOSCOPY W/ POLYPECTOMY  last one 07-24-2013 w/ EGD   CYSTOSCOPY W/ RETROGRADES Bilateral 02/09/2014   Procedure: BILATERAL RETROGRADE PYELOGRAM , Cystoscopy;  Surgeon: Ardis Hughs, MD;  Location[facility];  Service: Urology;  Laterality: Bilateral;   TRANSURETHRAL RESECTION OF BLADDER TUMOR WITH GYRUS (TURBT-GYRUS) N/A 02/09/2014   Procedure: Bladder Biopsies;  Surgeon: Ardis Hughs, MD;  Location: [facility];  Service: Urology;  Laterality: N/A;   Family History  Problem Relation Age of Onset   Colon cancer Mother    Hypertension Mother    Hyperlipidemia Mother    Hypertension Father    Hyperlipidemia Father    Diabetes Brother    Social History   Socioeconomic History   Marital status: Married    Spouse name: Not on file   Number of children: Not on file   Years of education: Not on file   Highest education level: Not on file  Occupational History   Not on file  Tobacco Use   Smoking status: Every Day    Packs/day: 0.50    Years: 40.00    Total pack years: 20.00    Types: Cigarettes   Smokeless tobacco: Never  Vaping Use   Vaping Use: Never used  Substance and Sexual Activity   Alcohol use: Yes    Comment: OCCASIONAL   Drug use: No   Sexual activity: Not Currently  Other Topics Concern   Not on file  Social History Narrative   Not on file   Social Determinants of Health   Financial Resource Strain: Low Risk  (10/10/2020)   Overall Financial Resource Strain (CARDIA)    Difficulty of Paying Living Expenses: Not hard at all  Food Insecurity: No Food Insecurity (10/10/2020)   Hunger Vital Sign    Worried About Running Out of Food in the Last Year: Never true    [place] in the Last Year: Never true   Transportation Needs: No Transportation Needs (10/10/2020)   PRAPARE - Hydrologist (Medical): No    Lack of Transportation (Non-Medical): No  Physical Activity: Sufficiently Active (10/10/2020)   Exercise Vital Sign    Days of Exercise per Week: 1 day    Minutes of Exercise per Session: 150+ min  Stress: No Stress Concern Present (10/10/2020)   [place]  of Stress : Not at all  Social Connections: Socially Integrated (10/10/2020)   Social Connection and Isolation Panel [NHANES]    Frequency of Communication with Friends and Family: More than three times a week    Frequency of Social Gatherings with Friends and Family: More than three times a week    Attends Religious Services: More than 4 times per year    Active Member of [employer] or Organizations: Yes    Attends Music therapist: More than 4 times per year    Marital Status: Married    Tobacco Counseling Ready to quit: Not Answered Counseling given: Not Answered   Clinical Intake:  Pre-visit preparation completed: Yes  Pain : 0-10 Pain Score: 6  Pain Type: Chronic pain Pain Location: Back Pain Orientation: Lower Pain Descriptors / Indicators: Aching Pain Onset: Other (comment) Pain Frequency: Constant     Nutritional Status: BMI > 30  Obese Nutritional Risks: None Diabetes: Yes CBG done?: Yes (128) CBG resulted in Enter/ Edit results?: No Did pt. bring in CBG monitor from home?: No Glucose Meter Downloaded?: No  How often do you need to have someone help you when you read instructions, pamphlets, or other written materials from your doctor or pharmacy?: 1 - Never  Diabetic? Yes  Interpreter Needed?: No      Activities of Daily Living    10/13/2021    3:47 PM  In your present state of health, do you have any difficulty performing the following activities:  Hearing? 1  Vision? 0  Difficulty  concentrating or making decisions? 0  Walking or climbing stairs? 0  Dressing or bathing? 0  Doing errands, shopping? 0    Patient Care Team: Binnie Rail, MD as PCP - General (Internal Medicine) Jerline Pain, MD as PCP - Cardiology (Cardiology) Juanita Craver, MD as Consulting Physician (Gastroenterology) Ardis Hughs, MD (Urology) Jerline Pain, MD (Cardiology) Charlton Haws, [facility]: Lambert Rhonda W as Pharmacist (Pharmacist) Katy Apo, MD as Consulting Physician (Ophthalmology)  Indicate any recent Medical Services you may have received from other than [facility] providers in the past year (date may be approximate).     Assessment:   This is a routine wellness examination for Colin Love.  Hearing/Vision screen No results found.  Dietary issues and exercise activities discussed: Current Exercise Habits: The patient does not participate in regular exercise at present, Exercise limited by: None identified;Other - see comments (back pain)   Depression Screen    10/13/2021    3:46 PM 10/10/2020   11:08 AM 02/07/2020    9:59 AM 02/07/2019    9:23 AM 08/04/2018    9:47 AM 05/27/2017   10:15 AM 11/26/2016    9:01 AM  PHQ 2/9 Scores  PHQ - 2 Score 0 0 0 0 0 1 0  PHQ- 9 Score 0     1   Exception Documentation Patient refusal          Fall Risk    10/13/2021    3:46 PM 10/10/2020   11:11 AM 02/07/2020    9:59 AM 08/08/2019   10:02 AM 02/07/2019    9:22 AM  Fall Risk   Falls in the past year? 0 0 0  0  Number falls in past yr: 0 0 0 0 0  Injury with Fall? 1 0 0 0   Risk for fall due to : No Fall Risks No Fall Risks No Fall Risks    Follow up Education provided Falls evaluation completed Falls  evaluation completed Falls evaluation completed     FALL RISK PREVENTION PERTAINING TO THE HOME:  Any stairs in or around the home? No  If so, are there any without handrails?  NA Home free of loose throw rugs in walkways, pet beds, electrical cords, etc? Yes  Adequate lighting in your home to  reduce risk of falls? Yes   ASSISTIVE DEVICES UTILIZED TO PREVENT FALLS:  Life alert? No  Use of a cane, walker or w/c? No  Grab bars in the bathroom? Yes  Shower chair or bench in shower? Yes  Elevated toilet seat or a handicapped toilet? No   TIMED UP AND GO:  Was the test performed? No .  Length of time to ambulate 10 feet: Unable to determine   Gait steady and fast without use of assistive device  Cognitive Function: Patient sounded coherent and cognitively intact, was able to respond appropriately to questions and was alert and oriented.      Immunizations Immunization History  Administered Date(s) Administered   Fluad Quad(high Dose 65+) 02/07/2019, 02/07/2020, 02/11/2021   Influenza Split 03/20/2011   Influenza, High Dose Seasonal PF 01/30/2013, 01/09/2014, 11/13/2014, 11/26/2015, 11/26/2016, 11/29/2017   Influenza, Seasonal, Injecte, Preservative Fre 03/17/2012   PFIZER(Purple Top)SARS-COV-2 Vaccination 04/20/2019, 05/15/2019, 02/06/2020   Pneumococcal Conjugate-13 11/13/2014   Pneumococcal Polysaccharide-23 03/20/2011   Tdap 07/02/2010    TDAP status: Due, Education has been provided regarding the importance of this vaccine. Advised may receive this vaccine at local pharmacy or Health Dept. Aware to provide a copy of the vaccination record if obtained from local pharmacy or Health Dept. Verbalized acceptance and understanding.  Flu Vaccine status: Due, Education has been provided regarding the importance of this vaccine. Advised may receive this vaccine at local pharmacy or Health Dept. Aware to provide a copy of the vaccination record if obtained from local pharmacy or Health Dept. Verbalized acceptance and understanding.  Pneumococcal Vaccine: Completed   Covid-19 vaccine status: Completed vaccines  Qualifies for Shingles Vaccine? Yes   Zostavax completed No   Shingrix Completed?: No.    Education has been provided regarding the importance of this vaccine.  Patient has been advised to call insurance company to determine out of pocket expense if they have not yet received this vaccine. Advised may also receive vaccine at local pharmacy or Health Dept. Verbalized acceptance and understanding.  Screening Tests Health Maintenance  Topic Date Due   FOOT EXAM  08/04/2019   TETANUS/TDAP  07/01/2020   OPHTHALMOLOGY EXAM  08/01/2021   INFLUENZA VACCINE  10/07/2021   Zoster Vaccines- Shingrix (1 of 2) 01/13/2022 (Originally 12/12/1964)   HEMOGLOBIN A1C  02/11/2022   Diabetic kidney evaluation - GFR measurement  08/13/2022   Diabetic kidney evaluation - Urine ACR  08/13/2022   Pneumonia Vaccine 75+ Years old  Completed   Hepatitis C Screening  Completed   HPV VACCINES  Aged Out   COLONOSCOPY (Pts 45-45yr Insurance coverage will need to be confirmed)  Discontinued   COVID-19 Vaccine  Discontinued    Health Maintenance  Health Maintenance Due  Topic Date Due   FOOT EXAM  08/04/2019   TETANUS/TDAP  07/01/2020   OPHTHALMOLOGY EXAM  08/01/2021   INFLUENZA VACCINE  10/07/2021      Lung Cancer Screening: (Low Dose CT Chest recommended if Age 77-80years, 30 pack-year currently smoking OR have quit w/in 15years.) does qualify.   Lung Cancer Screening Referral: NA  Additional Screening:  Hepatitis C Screening: does not qualify; Completed 08/07/2015  Vision Screening: Recommended annual ophthalmology exams for early detection of glaucoma and other disorders of the eye. Is the patient up to date with their annual eye exam?  Yes  Who is the provider or what is the name of the office in which the patient attends annual eye exams? Dr. Prudencio Burly If pt is not established with a provider, would they like to be referred to a provider to establish care? No .   Dental Screening: Recommended annual dental exams for proper oral hygiene  Community Resource Referral / Chronic Care Management: CRR required this visit?  No   CCM required this visit?  No       Plan:     I have personally reviewed and noted the following in the patient's chart:   Medical and social history Use of alcohol, tobacco or illicit drugs  Current medications and supplements including opioid prescriptions. Patient is not currently taking opioid prescriptions. Functional ability and status Nutritional status Physical activity Advanced directives List of other physicians Hospitalizations, surgeries, and ER visits in previous 12 months Vitals Screenings to include cognitive, depression, and falls Referrals and appointments  In addition, I have reviewed and discussed with patient certain preventive protocols, quality metrics, and best practice recommendations. A written personalized care plan for preventive services as well as general preventive health recommendations were provided to patient.     Henrene Dodge, RN   10/13/2021    Nurse Notes: None face to face 30 minutes  Colin Love ,  Thank you for taking time to come for your Medicare Wellness Visit. I appreciate your ongoing commitment to your health goals. Please review the following plan we discussed and let me know if I can assist you in the future.   These are the goals we discussed:  Goals      Patient Stated     Increase my physical activity by starting to walk twice a week.      Quit smoking / using tobacco     Smoking;  Educated to avoid secondary smoke Smoking cessation in [place]: [place] at [phone] -day  Classes offered a couple of times a month;  Will work with the patient as far as registration and location  Meds may help; chatix (Varenicline); Zyban (Bupropion SR); Nicotine Replacement (gum; lozenges; patches; etc.)   30 pack yr smoking hx: Educated regarding LDCT; To discuss with MD at next fup. Also educated on AAA screening for men 65-75 who have smoked         This is a list of the screening recommended for you and due dates:  Health Maintenance  Topic Date  Due   Complete foot exam   08/04/2019   Tetanus Vaccine  07/01/2020   Eye exam for diabetics  08/01/2021   Flu Shot  10/07/2021   Zoster (Shingles) Vaccine (1 of 2) 01/13/2022*   Hemoglobin A1C  02/11/2022   Yearly kidney function blood test for diabetes  08/13/2022   Yearly kidney health urinalysis for diabetes  08/13/2022   Pneumonia Vaccine  Completed   Hepatitis C Screening: USPSTF Recommendation to screen - Ages 33-79 yo.  Completed   HPV Vaccine  Aged Out   Colon Cancer Screening  Discontinued   COVID-19 Vaccine  Discontinued  *Topic was postponed. The date shown is not the original due date.

## 2021-10-26 ENCOUNTER — Other Ambulatory Visit: Payer: Self-pay | Admitting: Internal Medicine

## 2021-11-17 ENCOUNTER — Other Ambulatory Visit: Payer: Self-pay | Admitting: Cardiology

## 2021-11-17 DIAGNOSIS — I4819 Other persistent atrial fibrillation: Secondary | ICD-10-CM

## 2021-11-17 NOTE — Telephone Encounter (Signed)
Prescription refill request for Eliquis received. Indication:Afib Last office visit:1/23 Scr:1.0 Age: 76 Weight:87.4 kg  Prescription refilled

## 2021-12-19 ENCOUNTER — Other Ambulatory Visit: Payer: Self-pay | Admitting: Internal Medicine

## 2021-12-23 ENCOUNTER — Encounter: Payer: Self-pay | Admitting: Internal Medicine

## 2021-12-23 ENCOUNTER — Ambulatory Visit (INDEPENDENT_AMBULATORY_CARE_PROVIDER_SITE_OTHER): Payer: Medicare HMO | Admitting: Internal Medicine

## 2021-12-23 VITALS — BP 126/86 | HR 99 | Temp 98.8°F | Ht 66.93 in | Wt 190.0 lb

## 2021-12-23 DIAGNOSIS — R6883 Chills (without fever): Secondary | ICD-10-CM | POA: Diagnosis not present

## 2021-12-23 DIAGNOSIS — J069 Acute upper respiratory infection, unspecified: Secondary | ICD-10-CM | POA: Diagnosis not present

## 2021-12-23 DIAGNOSIS — R82998 Other abnormal findings in urine: Secondary | ICD-10-CM | POA: Diagnosis not present

## 2021-12-23 LAB — URINALYSIS, ROUTINE W REFLEX MICROSCOPIC
Ketones, ur: NEGATIVE
Leukocytes,Ua: NEGATIVE
Nitrite: NEGATIVE
Specific Gravity, Urine: 1.02 (ref 1.000–1.030)
Total Protein, Urine: 100 — AB
Urine Glucose: NEGATIVE
Urobilinogen, UA: 1 (ref 0.0–1.0)
pH: 6 (ref 5.0–8.0)

## 2021-12-23 LAB — POC INFLUENZA A&B (BINAX/QUICKVUE)
Influenza A, POC: NEGATIVE
Influenza B, POC: NEGATIVE

## 2021-12-23 NOTE — Patient Instructions (Addendum)
    Your tests for covid and flu are negative.    Urine tests were ordered.   The lab is on the first floor.    Medications changes include :   none    Return if symptoms worsen or fail to improve.

## 2021-12-23 NOTE — Assessment & Plan Note (Signed)
Acute Did have transient dark urine Having symptoms suggestive of viral URI, but given dark urine and history of UTI could also be meeting of sepsis UA urine culture to rule out infection

## 2021-12-23 NOTE — Assessment & Plan Note (Signed)
Acute Symptoms likely viral in nature COVID and flu are negative Continue symptomatic treatment with over-the-counter cold medications, Tylenol/ibuprofen Increase rest and fluids Call if symptoms worsen or do not improve

## 2021-12-23 NOTE — Progress Notes (Unsigned)
Subjective:    Patient ID: Colin Love, male    DOB: Jul 20, 1945, 76 y.o.   MRN: [id number]      HPI Colin Love is here for  Chief Complaint  Patient presents with   Chills    Body aches and chills (started Sunday; slight cough and headache     He is here for an acute visit for cold symptoms.  His symptoms started  two days ago  He is experiencing headaches, chills, sweats, weakness, fatigue, lightheadedness/dizziness, body aches, cough - dry. Has had right ear pain x 2 weeks or so.  His wife states he has been a little disorientated.   He has tried taking tylenol.  No known sick contacts  3 days or more frequent urination and darker urine.  Today it seems normal.     Medications and allergies reviewed with patient and updated if appropriate.  Current Outpatient Medications on File Prior to Visit  Medication Sig Dispense Refill   ACCU-CHEK SOFTCLIX LANCETS lancets TEST UP TO 4 TIMES A DAY 300 each 1   acetaminophen (TYLENOL) 500 MG tablet Take 1,000 mg by mouth every 6 (six) hours as needed for mild pain.     apixaban (ELIQUIS) 5 MG TABS tablet TAKE 1 TABLET BY MOUTH TWICE A DAY 60 tablet 5   atorvastatin (LIPITOR) 20 MG tablet TAKE 1 TABLET BY MOUTH ONCE DAILY AT 6PM 90 tablet 3   blood glucose meter kit and supplies KIT Dispense based on patient and insurance preference. Use up to four times daily as directed. (FOR ICD-9 250.00, 250.01). 1 each 0   diclofenac Sodium (VOLTAREN) 1 % GEL Apply 2 g topically daily as needed (pain).     diltiazem (CARDIZEM CD) 180 MG 24 hr capsule TAKE 1 CAPSULE BY MOUTH TWICE A DAY 180 capsule 3   gabapentin (NEURONTIN) 100 MG capsule Take 1-2 capsules (100-200 mg total) by mouth daily as needed. 90 capsule 3   latanoprost (XALATAN) 0.005 % ophthalmic solution Place 1 drop into both eyes at bedtime.      lisinopril-hydrochlorothiazide (ZESTORETIC) 20-12.5 MG tablet TAKE 1 TABLET BY MOUTH TWICE A DAY 180 tablet 3   meloxicam (MOBIC) 15 MG  tablet Take 15 mg by mouth daily.     metFORMIN (GLUCOPHAGE) 500 MG tablet TAKE 1 TABLET BY MOUTH TWICE A DAY WITH MEALS 180 tablet 1   ONE TOUCH ULTRA TEST test strip TEST UP TO 4 TIMES DAILY 100 each 1   potassium chloride (KLOR-CON) 10 MEQ tablet TAKE 3 TABLETS BY MOUTH DAILY 270 tablet 1   tamsulosin (FLOMAX) 0.4 MG CAPS capsule Take 0.4 mg by mouth daily.     terazosin (HYTRIN) 10 MG capsule TAKE 1 CAPSULE BY MOUTH EVERYDAY AT BEDTIME 90 capsule 1   Varenicline Tartrate, Starter, 0.5 MG X 11 & 1 MG X 42 TBPK TAKE AS DIRECTED 53 each 0   No current facility-administered medications on file prior to visit.    Review of Systems  Constitutional:  Positive for chills, diaphoresis, fatigue and fever (subjective).  HENT:  Positive for ear pain (right ear x couple of weeks). Negative for congestion, sinus pressure, sinus pain and sore throat.   Respiratory:  Positive for cough (mild - dry). Negative for chest tightness, shortness of breath and wheezing.   Gastrointestinal:  Negative for diarrhea and nausea.  Musculoskeletal:  Positive for back pain and myalgias.  Neurological:  Positive for dizziness, light-headedness and headaches.  Objective:   Vitals:   12/23/21 1408  BP: 126/86  Pulse: 99  Temp: 98.8 F (37.1 C)  SpO2: 99%   BP Readings from Last 3 Encounters:  12/23/21 126/86  10/01/21 120/80  08/12/21 120/68   Wt Readings from Last 3 Encounters:  12/23/21 190 lb (86.2 kg)  10/01/21 192 lb 9.6 oz (87.4 kg)  08/12/21 190 lb (86.2 kg)   Body mass index is 29.82 kg/m.    Physical Exam Constitutional:      General: He is not in acute distress.    Appearance: Normal appearance. He is not ill-appearing.  HENT:     Head: Normocephalic.     Right Ear: Tympanic membrane, ear canal and external ear normal. There is no impacted cerumen.     Left Ear: Tympanic membrane, ear canal and external ear normal. There is no impacted cerumen.     Mouth/Throat:     Mouth:  Mucous membranes are moist.     Pharynx: No oropharyngeal exudate or posterior oropharyngeal erythema.  Eyes:     Conjunctiva/sclera: Conjunctivae normal.  Cardiovascular:     Rate and Rhythm: Normal rate and regular rhythm.  Pulmonary:     Effort: Pulmonary effort is normal. No respiratory distress.     Breath sounds: Normal breath sounds. No wheezing or rales.  Musculoskeletal:     Cervical back: Neck supple. No tenderness.  Lymphadenopathy:     Cervical: No cervical adenopathy.  Skin:    General: Skin is warm and dry.     Findings: No rash.  Neurological:     Mental Status: He is alert.            Assessment & Plan:    See Problem List for Assessment and Plan of chronic medical problems.

## 2021-12-24 ENCOUNTER — Encounter (HOSPITAL_BASED_OUTPATIENT_CLINIC_OR_DEPARTMENT_OTHER): Payer: Self-pay

## 2021-12-24 ENCOUNTER — Other Ambulatory Visit: Payer: Self-pay

## 2021-12-24 ENCOUNTER — Emergency Department (HOSPITAL_BASED_OUTPATIENT_CLINIC_OR_DEPARTMENT_OTHER): Payer: Medicare HMO | Admitting: Radiology

## 2021-12-24 DIAGNOSIS — Z83438 Family history of other disorder of lipoprotein metabolism and other lipidemia: Secondary | ICD-10-CM

## 2021-12-24 DIAGNOSIS — E869 Volume depletion, unspecified: Secondary | ICD-10-CM | POA: Diagnosis present

## 2021-12-24 DIAGNOSIS — E440 Moderate protein-calorie malnutrition: Secondary | ICD-10-CM | POA: Diagnosis present

## 2021-12-24 DIAGNOSIS — I119 Hypertensive heart disease without heart failure: Secondary | ICD-10-CM | POA: Diagnosis present

## 2021-12-24 DIAGNOSIS — D649 Anemia, unspecified: Secondary | ICD-10-CM | POA: Diagnosis present

## 2021-12-24 DIAGNOSIS — Z833 Family history of diabetes mellitus: Secondary | ICD-10-CM

## 2021-12-24 DIAGNOSIS — K5730 Diverticulosis of large intestine without perforation or abscess without bleeding: Secondary | ICD-10-CM | POA: Diagnosis present

## 2021-12-24 DIAGNOSIS — Z800 Family history of malignant neoplasm of digestive organs: Secondary | ICD-10-CM

## 2021-12-24 DIAGNOSIS — H409 Unspecified glaucoma: Secondary | ICD-10-CM | POA: Diagnosis present

## 2021-12-24 DIAGNOSIS — A419 Sepsis, unspecified organism: Secondary | ICD-10-CM | POA: Diagnosis not present

## 2021-12-24 DIAGNOSIS — E1165 Type 2 diabetes mellitus with hyperglycemia: Secondary | ICD-10-CM | POA: Diagnosis present

## 2021-12-24 DIAGNOSIS — Z8551 Personal history of malignant neoplasm of bladder: Secondary | ICD-10-CM

## 2021-12-24 DIAGNOSIS — D3502 Benign neoplasm of left adrenal gland: Secondary | ICD-10-CM | POA: Diagnosis present

## 2021-12-24 DIAGNOSIS — Z86010 Personal history of colonic polyps: Secondary | ICD-10-CM

## 2021-12-24 DIAGNOSIS — E785 Hyperlipidemia, unspecified: Secondary | ICD-10-CM | POA: Diagnosis present

## 2021-12-24 DIAGNOSIS — Z6830 Body mass index (BMI) 30.0-30.9, adult: Secondary | ICD-10-CM

## 2021-12-24 DIAGNOSIS — R7401 Elevation of levels of liver transaminase levels: Secondary | ICD-10-CM | POA: Diagnosis present

## 2021-12-24 DIAGNOSIS — R262 Difficulty in walking, not elsewhere classified: Secondary | ICD-10-CM | POA: Diagnosis present

## 2021-12-24 DIAGNOSIS — Z7901 Long term (current) use of anticoagulants: Secondary | ICD-10-CM

## 2021-12-24 DIAGNOSIS — E876 Hypokalemia: Secondary | ICD-10-CM | POA: Diagnosis present

## 2021-12-24 DIAGNOSIS — Z79899 Other long term (current) drug therapy: Secondary | ICD-10-CM

## 2021-12-24 DIAGNOSIS — K219 Gastro-esophageal reflux disease without esophagitis: Secondary | ICD-10-CM | POA: Diagnosis present

## 2021-12-24 DIAGNOSIS — Z716 Tobacco abuse counseling: Secondary | ICD-10-CM

## 2021-12-24 DIAGNOSIS — I4819 Other persistent atrial fibrillation: Secondary | ICD-10-CM | POA: Diagnosis present

## 2021-12-24 DIAGNOSIS — G4733 Obstructive sleep apnea (adult) (pediatric): Secondary | ICD-10-CM | POA: Diagnosis present

## 2021-12-24 DIAGNOSIS — J189 Pneumonia, unspecified organism: Secondary | ICD-10-CM | POA: Diagnosis present

## 2021-12-24 DIAGNOSIS — F17210 Nicotine dependence, cigarettes, uncomplicated: Secondary | ICD-10-CM | POA: Diagnosis present

## 2021-12-24 DIAGNOSIS — R509 Fever, unspecified: Secondary | ICD-10-CM | POA: Diagnosis not present

## 2021-12-24 DIAGNOSIS — N400 Enlarged prostate without lower urinary tract symptoms: Secondary | ICD-10-CM | POA: Diagnosis present

## 2021-12-24 DIAGNOSIS — Z8249 Family history of ischemic heart disease and other diseases of the circulatory system: Secondary | ICD-10-CM

## 2021-12-24 DIAGNOSIS — Z7984 Long term (current) use of oral hypoglycemic drugs: Secondary | ICD-10-CM

## 2021-12-24 DIAGNOSIS — Z791 Long term (current) use of non-steroidal anti-inflammatories (NSAID): Secondary | ICD-10-CM

## 2021-12-24 LAB — CBC WITH DIFFERENTIAL/PLATELET
Abs Immature Granulocytes: 0.07 10*3/uL (ref 0.00–0.07)
Basophils Absolute: 0 10*3/uL (ref 0.0–0.1)
Basophils Relative: 0 %
Eosinophils Absolute: 0 10*3/uL (ref 0.0–0.5)
Eosinophils Relative: 0 %
HCT: 43.2 % (ref 39.0–52.0)
Hemoglobin: 14.5 g/dL (ref 13.0–17.0)
Immature Granulocytes: 1 %
Lymphocytes Relative: 1 %
Lymphs Abs: 0.2 10*3/uL — ABNORMAL LOW (ref 0.7–4.0)
MCH: 28.7 pg (ref 26.0–34.0)
MCHC: 33.6 g/dL (ref 30.0–36.0)
MCV: 85.4 fL (ref 80.0–100.0)
Monocytes Absolute: 0.5 10*3/uL (ref 0.1–1.0)
Monocytes Relative: 4 %
Neutro Abs: 12 10*3/uL — ABNORMAL HIGH (ref 1.7–7.7)
Neutrophils Relative %: 94 %
Platelets: 182 10*3/uL (ref 150–400)
RBC: 5.06 MIL/uL (ref 4.22–5.81)
RDW: 14.5 % (ref 11.5–15.5)
WBC: 12.7 10*3/uL — ABNORMAL HIGH (ref 4.0–10.5)
nRBC: 0 % (ref 0.0–0.2)

## 2021-12-24 LAB — POC COVID19 BINAXNOW: SARS Coronavirus 2 Ag: NEGATIVE

## 2021-12-24 LAB — COMPREHENSIVE METABOLIC PANEL WITH GFR
ALT: 66 U/L — ABNORMAL HIGH (ref 0–44)
AST: 85 U/L — ABNORMAL HIGH (ref 15–41)
Albumin: 4 g/dL (ref 3.5–5.0)
Alkaline Phosphatase: 74 U/L (ref 38–126)
Anion gap: 12 (ref 5–15)
BUN: 23 mg/dL (ref 8–23)
CO2: 26 mmol/L (ref 22–32)
Calcium: 10.8 mg/dL — ABNORMAL HIGH (ref 8.9–10.3)
Chloride: 96 mmol/L — ABNORMAL LOW (ref 98–111)
Creatinine, Ser: 1.3 mg/dL — ABNORMAL HIGH (ref 0.61–1.24)
GFR, Estimated: 57 mL/min — ABNORMAL LOW
Glucose, Bld: 226 mg/dL — ABNORMAL HIGH (ref 70–99)
Potassium: 3.1 mmol/L — ABNORMAL LOW (ref 3.5–5.1)
Sodium: 134 mmol/L — ABNORMAL LOW (ref 135–145)
Total Bilirubin: 0.9 mg/dL (ref 0.3–1.2)
Total Protein: 7.6 g/dL (ref 6.5–8.1)

## 2021-12-24 LAB — LACTIC ACID, PLASMA: Lactic Acid, Venous: 1.8 mmol/L (ref 0.5–1.9)

## 2021-12-24 NOTE — ED Triage Notes (Signed)
Elevated temperature with chills, body aches and weakness since this past Sunday.

## 2021-12-25 ENCOUNTER — Encounter (HOSPITAL_COMMUNITY): Payer: Self-pay

## 2021-12-25 ENCOUNTER — Telehealth: Payer: Self-pay

## 2021-12-25 ENCOUNTER — Inpatient Hospital Stay (HOSPITAL_BASED_OUTPATIENT_CLINIC_OR_DEPARTMENT_OTHER)
Admission: EM | Admit: 2021-12-25 | Discharge: 2021-12-27 | DRG: 871 | Disposition: A | Discharge status: Home / Self Care | Payer: Medicare HMO | Attending: Family Medicine | Admitting: Family Medicine

## 2021-12-25 DIAGNOSIS — E869 Volume depletion, unspecified: Secondary | ICD-10-CM | POA: Diagnosis present

## 2021-12-25 DIAGNOSIS — R7401 Elevation of levels of liver transaminase levels: Secondary | ICD-10-CM | POA: Diagnosis present

## 2021-12-25 DIAGNOSIS — N400 Enlarged prostate without lower urinary tract symptoms: Secondary | ICD-10-CM

## 2021-12-25 DIAGNOSIS — E876 Hypokalemia: Secondary | ICD-10-CM | POA: Diagnosis present

## 2021-12-25 DIAGNOSIS — E119 Type 2 diabetes mellitus without complications: Secondary | ICD-10-CM

## 2021-12-25 DIAGNOSIS — D649 Anemia, unspecified: Secondary | ICD-10-CM

## 2021-12-25 DIAGNOSIS — K5730 Diverticulosis of large intestine without perforation or abscess without bleeding: Secondary | ICD-10-CM | POA: Diagnosis present

## 2021-12-25 DIAGNOSIS — J189 Pneumonia, unspecified organism: Secondary | ICD-10-CM | POA: Diagnosis present

## 2021-12-25 DIAGNOSIS — A419 Sepsis, unspecified organism: Secondary | ICD-10-CM | POA: Diagnosis present

## 2021-12-25 DIAGNOSIS — E440 Moderate protein-calorie malnutrition: Secondary | ICD-10-CM | POA: Diagnosis present

## 2021-12-25 DIAGNOSIS — K219 Gastro-esophageal reflux disease without esophagitis: Secondary | ICD-10-CM | POA: Diagnosis present

## 2021-12-25 DIAGNOSIS — E1169 Type 2 diabetes mellitus with other specified complication: Secondary | ICD-10-CM | POA: Diagnosis present

## 2021-12-25 DIAGNOSIS — I4819 Other persistent atrial fibrillation: Secondary | ICD-10-CM | POA: Diagnosis present

## 2021-12-25 DIAGNOSIS — I10 Essential (primary) hypertension: Secondary | ICD-10-CM | POA: Diagnosis not present

## 2021-12-25 DIAGNOSIS — E118 Type 2 diabetes mellitus with unspecified complications: Secondary | ICD-10-CM | POA: Diagnosis not present

## 2021-12-25 DIAGNOSIS — H409 Unspecified glaucoma: Secondary | ICD-10-CM

## 2021-12-25 DIAGNOSIS — E1159 Type 2 diabetes mellitus with other circulatory complications: Secondary | ICD-10-CM | POA: Diagnosis present

## 2021-12-25 DIAGNOSIS — K648 Other hemorrhoids: Secondary | ICD-10-CM | POA: Insufficient documentation

## 2021-12-25 DIAGNOSIS — R262 Difficulty in walking, not elsewhere classified: Secondary | ICD-10-CM | POA: Diagnosis present

## 2021-12-25 DIAGNOSIS — G4733 Obstructive sleep apnea (adult) (pediatric): Secondary | ICD-10-CM | POA: Diagnosis present

## 2021-12-25 DIAGNOSIS — Z791 Long term (current) use of non-steroidal anti-inflammatories (NSAID): Secondary | ICD-10-CM | POA: Diagnosis not present

## 2021-12-25 DIAGNOSIS — E785 Hyperlipidemia, unspecified: Secondary | ICD-10-CM | POA: Diagnosis present

## 2021-12-25 DIAGNOSIS — Z7984 Long term (current) use of oral hypoglycemic drugs: Secondary | ICD-10-CM | POA: Diagnosis not present

## 2021-12-25 DIAGNOSIS — Z8619 Personal history of other infectious and parasitic diseases: Secondary | ICD-10-CM

## 2021-12-25 DIAGNOSIS — D3502 Benign neoplasm of left adrenal gland: Secondary | ICD-10-CM | POA: Diagnosis present

## 2021-12-25 DIAGNOSIS — F17210 Nicotine dependence, cigarettes, uncomplicated: Secondary | ICD-10-CM | POA: Diagnosis present

## 2021-12-25 DIAGNOSIS — Z79899 Other long term (current) drug therapy: Secondary | ICD-10-CM | POA: Diagnosis not present

## 2021-12-25 DIAGNOSIS — E1165 Type 2 diabetes mellitus with hyperglycemia: Secondary | ICD-10-CM | POA: Diagnosis present

## 2021-12-25 DIAGNOSIS — Z6830 Body mass index (BMI) 30.0-30.9, adult: Secondary | ICD-10-CM | POA: Diagnosis not present

## 2021-12-25 DIAGNOSIS — I119 Hypertensive heart disease without heart failure: Secondary | ICD-10-CM | POA: Diagnosis present

## 2021-12-25 HISTORY — DX: Personal history of other infectious and parasitic diseases: Z86.19

## 2021-12-25 LAB — COMPREHENSIVE METABOLIC PANEL WITH GFR
ALT: 55 U/L — ABNORMAL HIGH (ref 0–44)
AST: 58 U/L — ABNORMAL HIGH (ref 15–41)
Albumin: 2.8 g/dL — ABNORMAL LOW (ref 3.5–5.0)
Alkaline Phosphatase: 61 U/L (ref 38–126)
Anion gap: 7 (ref 5–15)
BUN: 24 mg/dL — ABNORMAL HIGH (ref 8–23)
CO2: 26 mmol/L (ref 22–32)
Calcium: 9.5 mg/dL (ref 8.9–10.3)
Chloride: 101 mmol/L (ref 98–111)
Creatinine, Ser: 1.06 mg/dL (ref 0.61–1.24)
GFR, Estimated: >60 mL/min
Glucose, Bld: 195 mg/dL — ABNORMAL HIGH (ref 70–99)
Potassium: 3.5 mmol/L (ref 3.5–5.1)
Sodium: 134 mmol/L — ABNORMAL LOW (ref 135–145)
Total Bilirubin: 0.9 mg/dL (ref 0.3–1.2)
Total Protein: 6.3 g/dL — ABNORMAL LOW (ref 6.5–8.1)

## 2021-12-25 LAB — URINALYSIS, ROUTINE W REFLEX MICROSCOPIC
Bacteria, UA: NONE SEEN
Bilirubin Urine: NEGATIVE
Glucose, UA: NEGATIVE mg/dL
Ketones, ur: NEGATIVE mg/dL
Leukocytes,Ua: NEGATIVE
Nitrite: NEGATIVE
Protein, ur: 30 mg/dL — AB
Specific Gravity, Urine: 1.02 (ref 1.005–1.030)
pH: 5 (ref 5.0–8.0)

## 2021-12-25 LAB — GLUCOSE, CAPILLARY
Glucose-Capillary: 190 mg/dL — ABNORMAL HIGH (ref 70–99)
Glucose-Capillary: 127 mg/dL — ABNORMAL HIGH (ref 70–99)
Glucose-Capillary: 137 mg/dL — ABNORMAL HIGH (ref 70–99)

## 2021-12-25 LAB — URINE CULTURE: Result:: NO GROWTH

## 2021-12-25 LAB — CBC WITH DIFFERENTIAL/PLATELET
Abs Immature Granulocytes: 0.34 10*3/uL — ABNORMAL HIGH (ref 0.00–0.07)
Basophils Absolute: 0.1 10*3/uL (ref 0.0–0.1)
Basophils Relative: 0 %
Eosinophils Absolute: 0 10*3/uL (ref 0.0–0.5)
Eosinophils Relative: 0 %
HCT: 38.9 % — ABNORMAL LOW (ref 39.0–52.0)
Hemoglobin: 12.7 g/dL — ABNORMAL LOW (ref 13.0–17.0)
Immature Granulocytes: 2 %
Lymphocytes Relative: 3 %
Lymphs Abs: 0.7 10*3/uL (ref 0.7–4.0)
MCH: 28.4 pg (ref 26.0–34.0)
MCHC: 32.6 g/dL (ref 30.0–36.0)
MCV: 87 fL (ref 80.0–100.0)
Monocytes Absolute: 1.4 10*3/uL — ABNORMAL HIGH (ref 0.1–1.0)
Monocytes Relative: 7 %
Neutro Abs: 18 10*3/uL — ABNORMAL HIGH (ref 1.7–7.7)
Neutrophils Relative %: 88 %
Platelets: 178 10*3/uL (ref 150–400)
RBC: 4.47 MIL/uL (ref 4.22–5.81)
RDW: 14.6 % (ref 11.5–15.5)
WBC: 20.4 10*3/uL — ABNORMAL HIGH (ref 4.0–10.5)
nRBC: 0 % (ref 0.0–0.2)

## 2021-12-25 LAB — PHOSPHORUS: Phosphorus: 4.2 mg/dL (ref 2.5–4.6)

## 2021-12-25 LAB — MAGNESIUM: Magnesium: 1.7 mg/dL (ref 1.7–2.4)

## 2021-12-25 MED ORDER — PANTOPRAZOLE SODIUM 40 MG PO TBEC
40.0000 mg | DELAYED_RELEASE_TABLET | Freq: Every day | ORAL | Status: DC
  Administered 2021-12-25 – 2021-12-27 (×3): 40 mg via ORAL
  Filled 2021-12-25 (×3): qty 1

## 2021-12-25 MED ORDER — TAMSULOSIN HCL 0.4 MG PO CAPS
0.4000 mg | ORAL_CAPSULE | Freq: Every day | ORAL | Status: DC
  Administered 2021-12-25 – 2021-12-27 (×3): 0.4 mg via ORAL
  Filled 2021-12-25 (×3): qty 1

## 2021-12-25 MED ORDER — ONDANSETRON HCL 4 MG/2ML IJ SOLN: 4.0000 mg | Freq: Four times a day (QID) | INTRAMUSCULAR | Status: DC | PRN

## 2021-12-25 MED ORDER — SODIUM CHLORIDE 0.9 % IV SOLN
2.0000 g | Freq: Once | INTRAVENOUS | Status: AC
  Administered 2021-12-25: 2 g via INTRAVENOUS
  Filled 2021-12-25: qty 20

## 2021-12-25 MED ORDER — ACETAMINOPHEN 325 MG PO TABS
650.0000 mg | ORAL_TABLET | Freq: Four times a day (QID) | ORAL | Status: DC | PRN
  Administered 2021-12-25: 650 mg via ORAL
  Filled 2021-12-25: qty 2

## 2021-12-25 MED ORDER — LATANOPROST 0.005 % OP SOLN
1.0000 [drp] | Freq: Every day | OPHTHALMIC | Status: DC
  Administered 2021-12-25 – 2021-12-26 (×2): 1 [drp] via OPHTHALMIC
  Filled 2021-12-25: qty 2.5

## 2021-12-25 MED ORDER — SODIUM CHLORIDE 0.9 % IV SOLN
2.0000 g | INTRAVENOUS | Status: DC
  Administered 2021-12-26 – 2021-12-27 (×2): 2 g via INTRAVENOUS
  Filled 2021-12-25 (×2): qty 20

## 2021-12-25 MED ORDER — APIXABAN 5 MG PO TABS
5.0000 mg | ORAL_TABLET | Freq: Two times a day (BID) | ORAL | Status: DC
  Administered 2021-12-25 – 2021-12-27 (×6): 5 mg via ORAL
  Filled 2021-12-25 (×4): qty 1
  Filled 2021-12-25: qty 2
  Filled 2021-12-25: qty 1

## 2021-12-25 MED ORDER — ONDANSETRON HCL 4 MG PO TABS: 4.0000 mg | ORAL_TABLET | Freq: Four times a day (QID) | ORAL | Status: DC | PRN

## 2021-12-25 MED ORDER — HYDROCHLOROTHIAZIDE 12.5 MG PO TABS
12.5000 mg | ORAL_TABLET | Freq: Two times a day (BID) | ORAL | Status: DC
  Filled 2021-12-25: qty 1

## 2021-12-25 MED ORDER — SODIUM CHLORIDE 0.9 % IV SOLN
500.0000 mg | Freq: Once | INTRAVENOUS | Status: AC
  Administered 2021-12-25: 500 mg via INTRAVENOUS
  Filled 2021-12-25: qty 5

## 2021-12-25 MED ORDER — ACETAMINOPHEN 650 MG RE SUPP: 650.0000 mg | Freq: Four times a day (QID) | RECTAL | Status: DC | PRN

## 2021-12-25 MED ORDER — SODIUM CHLORIDE 0.9 % IV SOLN
500.0000 mg | INTRAVENOUS | Status: DC
  Administered 2021-12-26 – 2021-12-27 (×2): 500 mg via INTRAVENOUS
  Filled 2021-12-25 (×2): qty 5

## 2021-12-25 MED ORDER — LISINOPRIL 20 MG PO TABS: 20.0000 mg | ORAL_TABLET | Freq: Two times a day (BID) | ORAL | Status: DC

## 2021-12-25 MED ORDER — ATORVASTATIN CALCIUM 20 MG PO TABS: 20.0000 mg | ORAL_TABLET | Freq: Every day | ORAL | Status: DC

## 2021-12-25 MED ORDER — LISINOPRIL-HYDROCHLOROTHIAZIDE 20-12.5 MG PO TABS: 1.0000 | ORAL_TABLET | Freq: Two times a day (BID) | ORAL | Status: DC

## 2021-12-25 MED ORDER — IPRATROPIUM-ALBUTEROL 0.5-2.5 (3) MG/3ML IN SOLN: 3.0000 mL | RESPIRATORY_TRACT | Status: DC | PRN

## 2021-12-25 MED ORDER — METFORMIN HCL 500 MG PO TABS
500.0000 mg | ORAL_TABLET | Freq: Two times a day (BID) | ORAL | Status: DC
  Administered 2021-12-25 – 2021-12-27 (×5): 500 mg via ORAL
  Filled 2021-12-25 (×5): qty 1

## 2021-12-25 MED ORDER — GABAPENTIN 100 MG PO CAPS: 100.0000 mg | ORAL_CAPSULE | Freq: Every day | ORAL | Status: DC | PRN

## 2021-12-25 MED ORDER — INSULIN ASPART 100 UNIT/ML IJ SOLN
0.0000 [IU] | Freq: Three times a day (TID) | INTRAMUSCULAR | Status: DC
  Administered 2021-12-25: 2 [IU] via SUBCUTANEOUS
  Administered 2021-12-25: 3 [IU] via SUBCUTANEOUS
  Administered 2021-12-26: 2 [IU] via SUBCUTANEOUS

## 2021-12-25 MED ORDER — LISINOPRIL 20 MG PO TABS
20.0000 mg | ORAL_TABLET | Freq: Every day | ORAL | Status: DC
  Administered 2021-12-26 – 2021-12-27 (×2): 20 mg via ORAL
  Filled 2021-12-25 (×2): qty 1

## 2021-12-25 MED ORDER — POTASSIUM CHLORIDE 2 MEQ/ML IV SOLN
INTRAVENOUS | Status: DC
  Filled 2021-12-25 (×2): qty 1000

## 2021-12-25 MED ORDER — TERAZOSIN HCL 5 MG PO CAPS
10.0000 mg | ORAL_CAPSULE | Freq: Every day | ORAL | Status: DC
  Administered 2021-12-25 – 2021-12-26 (×2): 10 mg via ORAL
  Filled 2021-12-25 (×3): qty 2

## 2021-12-25 MED ORDER — POTASSIUM CHLORIDE CRYS ER 20 MEQ PO TBCR
40.0000 meq | EXTENDED_RELEASE_TABLET | Freq: Every day | ORAL | Status: DC
  Administered 2021-12-25 – 2021-12-27 (×3): 40 meq via ORAL
  Filled 2021-12-25 (×3): qty 2

## 2021-12-25 MED ORDER — DILTIAZEM HCL ER COATED BEADS 180 MG PO CP24
180.0000 mg | ORAL_CAPSULE | Freq: Two times a day (BID) | ORAL | Status: DC
  Administered 2021-12-25 – 2021-12-27 (×5): 180 mg via ORAL
  Filled 2021-12-25 (×6): qty 1

## 2021-12-25 MED ORDER — SODIUM CHLORIDE 0.9 % IV SOLN: INTRAVENOUS | Status: DC | PRN

## 2021-12-25 MED ORDER — HYDROCHLOROTHIAZIDE 12.5 MG PO TABS
12.5000 mg | ORAL_TABLET | Freq: Every day | ORAL | Status: DC
  Administered 2021-12-26 – 2021-12-27 (×2): 12.5 mg via ORAL
  Filled 2021-12-25 (×2): qty 1

## 2021-12-25 MED ORDER — FAMOTIDINE 20 MG PO TABS: 20.0000 mg | ORAL_TABLET | Freq: Two times a day (BID) | ORAL | Status: DC

## 2021-12-25 MED ORDER — NICOTINE 14 MG/24HR TD PT24
14.0000 mg | MEDICATED_PATCH | Freq: Every day | TRANSDERMAL | Status: DC
  Administered 2021-12-25 – 2021-12-27 (×3): 14 mg via TRANSDERMAL
  Filled 2021-12-25 (×3): qty 1

## 2021-12-25 MED ORDER — ACETAMINOPHEN 500 MG PO TABS
1000.0000 mg | ORAL_TABLET | Freq: Once | ORAL | Status: AC
  Administered 2021-12-25: 1000 mg via ORAL
  Filled 2021-12-25: qty 2

## 2021-12-25 MED ORDER — POTASSIUM CHLORIDE 2 MEQ/ML IV SOLN
INTRAVENOUS | Status: DC
  Filled 2021-12-25: qty 1000

## 2021-12-25 MED ORDER — MAGNESIUM SULFATE 2 GM/50ML IV SOLN
2.0000 g | Freq: Once | INTRAVENOUS | Status: AC
  Administered 2021-12-25: 2 g via INTRAVENOUS
  Filled 2021-12-25: qty 50

## 2021-12-25 NOTE — ED Provider Notes (Signed)
[facility] EMERGENCY DEPT Provider Note   CSN: [id number] Arrival date & time: 12/24/21  2140     History  Chief Complaint  Patient presents with   Chills    Colin Love is a 76 y.o. male.  76 yo M with a chief complaints of cough congestion fevers chills fatigue shortness of breath.  This been going on for about 4 days now.  He has seen his family doctor for this thought to be a viral syndrome and was discharged home.  Since then the patient has had progressive fatigue and has been unable to walk.  Has been having rigors off and on.  Feeling hot and cold all the time.        Home Medications Prior to Admission medications   Medication Sig Start Date End Date Taking? Authorizing Provider  ACCU-CHEK SOFTCLIX LANCETS lancets TEST UP TO 4 TIMES A DAY 11/29/17   Binnie Rail, MD  acetaminophen (TYLENOL) 500 MG tablet Take 1,000 mg by mouth every 6 (six) hours as needed for mild pain.    [provider]  apixaban (ELIQUIS) 5 MG TABS tablet TAKE 1 TABLET BY MOUTH TWICE A DAY 11/17/21   Jerline Pain, MD  atorvastatin (LIPITOR) 20 MG tablet TAKE 1 TABLET BY MOUTH ONCE DAILY AT 6PM 07/30/21   Binnie Rail, MD  blood glucose meter kit and supplies KIT Dispense based on patient and insurance preference. Use up to four times daily as directed. (FOR ICD-9 250.00, 250.01). 05/21/15   Binnie Rail, MD  diclofenac Sodium (VOLTAREN) 1 % GEL Apply 2 g topically daily as needed (pain).    [provider]  diltiazem (CARDIZEM CD) 180 MG 24 hr capsule TAKE 1 CAPSULE BY MOUTH TWICE A DAY 07/30/21   Burns, Claudina Lick, MD  gabapentin (NEURONTIN) 100 MG capsule Take 1-2 capsules (100-200 mg total) by mouth daily as needed. 02/07/19   Burns, Claudina Lick, MD  latanoprost (XALATAN) 0.005 % ophthalmic solution Place 1 drop into both eyes at bedtime.  10/17/15   [provider]  lisinopril-hydrochlorothiazide (ZESTORETIC) 20-12.5 MG tablet TAKE 1 TABLET BY MOUTH TWICE  A DAY 07/30/21   Burns, Claudina Lick, MD  meloxicam (MOBIC) 15 MG tablet Take 15 mg by mouth daily. 11/27/19   [provider]  metFORMIN (GLUCOPHAGE) 500 MG tablet TAKE 1 TABLET BY MOUTH TWICE A DAY WITH MEALS 12/22/21   Burns, Claudina Lick, MD  ONE TOUCH ULTRA TEST test strip TEST UP TO 4 TIMES DAILY 05/27/18   Binnie Rail, MD  potassium chloride (KLOR-CON) 10 MEQ tablet TAKE 3 TABLETS BY MOUTH DAILY 07/11/21   Binnie Rail, MD  tamsulosin (FLOMAX) 0.4 MG CAPS capsule Take 0.4 mg by mouth daily. 12/31/19   [provider]  terazosin (HYTRIN) 10 MG capsule TAKE 1 CAPSULE BY MOUTH EVERYDAY AT BEDTIME 10/27/21   Burns, Claudina Lick, MD  Varenicline Tartrate, Starter, 0.5 MG X 11 & 1 MG X 42 TBPK TAKE AS DIRECTED 12/22/21   Binnie Rail, MD      Allergies    Patient has no known allergies.    Review of Systems   Review of Systems  Physical Exam Updated Vital Signs BP 122/65   Pulse 89   Temp (!) 103 F (39.4 C) (Oral)   Resp (!) 22   Ht _0  (1.676 m)   Wt 86.2 kg   SpO2 92%   BMI 30.67 kg/m  Physical Exam  Vitals and nursing note reviewed.  Constitutional:      Appearance: He is well-developed.  HENT:     Head: Normocephalic and atraumatic.  Eyes:     Pupils: Pupils are equal, round, and reactive to light.  Neck:     Vascular: No JVD.  Cardiovascular:     Rate and Rhythm: Normal rate and regular rhythm.     Heart sounds: No murmur heard.    No friction rub. No gallop.  Pulmonary:     Effort: No respiratory distress.     Breath sounds: No wheezing.  Abdominal:     General: There is no distension.     Tenderness: There is no abdominal tenderness. There is no guarding or rebound.  Musculoskeletal:        General: Normal range of motion.     Cervical back: Normal range of motion and neck supple.  Skin:    Coloration: Skin is not pale.     Findings: No rash.  Neurological:     Mental Status: He is alert and oriented to person, place, and time.  Psychiatric:         Behavior: Behavior normal.     ED Results / Procedures / Treatments   Labs (all labs ordered are listed, but only abnormal results are displayed) Labs Reviewed  COMPREHENSIVE METABOLIC PANEL - Abnormal; Notable for the following components:      Result Value   Sodium 134 (*)    Potassium 3.1 (*)    Chloride 96 (*)    Glucose, Bld 226 (*)    Creatinine, Ser 1.30 (*)    Calcium 10.8 (*)    AST 85 (*)    ALT 66 (*)    GFR, Estimated 57 (*)    All other components within normal limits  CBC WITH DIFFERENTIAL/PLATELET - Abnormal; Notable for the following components:   WBC 12.7 (*)    Neutro Abs 12.0 (*)    Lymphs Abs 0.2 (*)    All other components within normal limits  LACTIC ACID, PLASMA  LACTIC ACID, PLASMA  URINALYSIS, ROUTINE W REFLEX MICROSCOPIC    EKG None  Radiology DG Chest 2 View  Result Date: 12/24/2021 CLINICAL DATA:  Fever, chills, and body aches EXAM: CHEST - 2 VIEW COMPARISON:  Radiographs 05/22/2003 FINDINGS: Hazy opacity in the right mid lung compatible with pneumonia. No pleural effusion or pneumothorax. Normal cardiomediastinal silhouette. Aortic atherosclerotic calcification. Cervical spine fusion. No acute osseous abnormality. IMPRESSION: Right mid lung pneumonia. Electronically Signed   By: Placido Sou M.D.   On: 12/24/2021 22:39    Procedures Procedures    Medications Ordered in ED Medications  azithromycin (ZITHROMAX) 500 mg in sodium chloride 0.9 % 250 mL IVPB (500 mg Intravenous New Bag/Given 12/25/21 0157)  0.9 %  sodium chloride infusion ( Intravenous New Bag/Given 12/25/21 0118)  apixaban (ELIQUIS) tablet 5 mg (5 mg Oral Given 12/25/21 0219)  atorvastatin (LIPITOR) tablet 20 mg (has no administration in time range)  diltiazem (CARDIZEM CD) 24 hr capsule 180 mg (has no administration in time range)  gabapentin (NEURONTIN) capsule 100-200 mg (has no administration in time range)  metFORMIN (GLUCOPHAGE) tablet 500 mg (has no administration  in time range)  tamsulosin (FLOMAX) capsule 0.4 mg (has no administration in time range)  terazosin (HYTRIN) capsule 10 mg (has no administration in time range)  lisinopril (ZESTRIL) tablet 20 mg (has no administration in time range)    And  hydrochlorothiazide (HYDRODIURIL) tablet 12.5 mg (has  no administration in time range)  cefTRIAXone (ROCEPHIN) 2 g in sodium chloride 0.9 % 100 mL IVPB (0 g Intravenous Stopped 12/25/21 0155)  acetaminophen (TYLENOL) tablet 1,000 mg (1,000 mg Oral Given 12/25/21 0219)    ED Course/ Medical Decision Making/ A&P                           Medical Decision Making Amount and/or Complexity of Data Reviewed Labs: ordered. Radiology: ordered.  Risk OTC drugs. Prescription drug management. Decision regarding hospitalization.   76 yo M with a chief complaints of cough congestion fevers chills myalgias rigors.  Has had confusion and progressive fatigue over the past couple days.  Got to the point where he is really unable to walk per his wife who provides further history.  Chest x-ray independently interpreted by me with right-sided pneumonia.  Mild leukocytosis mild bump in renal function.  Lactate is normal.  Patient's curb 65 is a 3.  I discussed this with the patient and family.  We will start on IV antibiotics.  Will discuss with hospitalist for admission.  CRITICAL CARE Performed by: Cecilio Asper   Total critical care time: 35 minutes  Critical care time was exclusive of separately billable procedures and treating other patients.  Critical care was necessary to treat or prevent imminent or life-threatening deterioration.  Critical care was time spent personally by me on the following activities: development of treatment plan with patient and/or surrogate as well as nursing, discussions with consultants, evaluation of patient's response to treatment, examination of patient, obtaining history from patient or surrogate, ordering and  performing treatments and interventions, ordering and review of laboratory studies, ordering and review of radiographic studies, pulse oximetry and re-evaluation of patient's condition.  The patients results and plan were reviewed and discussed.   Any x-rays performed were independently reviewed by myself.   Differential diagnosis were considered with the presenting HPI.  Medications  azithromycin (ZITHROMAX) 500 mg in sodium chloride 0.9 % 250 mL IVPB (500 mg Intravenous New Bag/Given 12/25/21 0157)  0.9 %  sodium chloride infusion ( Intravenous New Bag/Given 12/25/21 0118)  apixaban (ELIQUIS) tablet 5 mg (5 mg Oral Given 12/25/21 0219)  atorvastatin (LIPITOR) tablet 20 mg (has no administration in time range)  diltiazem (CARDIZEM CD) 24 hr capsule 180 mg (has no administration in time range)  gabapentin (NEURONTIN) capsule 100-200 mg (has no administration in time range)  metFORMIN (GLUCOPHAGE) tablet 500 mg (has no administration in time range)  tamsulosin (FLOMAX) capsule 0.4 mg (has no administration in time range)  terazosin (HYTRIN) capsule 10 mg (has no administration in time range)  lisinopril (ZESTRIL) tablet 20 mg (has no administration in time range)    And  hydrochlorothiazide (HYDRODIURIL) tablet 12.5 mg (has no administration in time range)  cefTRIAXone (ROCEPHIN) 2 g in sodium chloride 0.9 % 100 mL IVPB (0 g Intravenous Stopped 12/25/21 0155)  acetaminophen (TYLENOL) tablet 1,000 mg (1,000 mg Oral Given 12/25/21 0219)    Vitals:   12/24/21 2150 12/25/21 0100 12/25/21 0130 12/25/21 0150  BP:  125/74 122/65   Pulse:  (!) 103 96 89  Resp:  (!) 36 (!) 32 (!) 22  Temp:    (!) 103 F (39.4 C)  TempSrc:    Oral  SpO2:  98% 93% 92%  Weight: 86.2 kg     Height: _0  (1.676 m)       Final diagnoses:  Community acquired pneumonia of  right middle lobe of lung    Admission/ observation were discussed with the admitting physician, patient and/or family and they are  comfortable with the plan.          Final Clinical Impression(s) / ED Diagnoses Final diagnoses:  Community acquired pneumonia of right middle lobe of lung    Rx / DC Orders ED Discharge Orders     None         Deno Etienne, DO 12/25/21 0221

## 2021-12-25 NOTE — Progress Notes (Signed)
Mobility Specialist - Progress Note   12/25/21 1059  Mobility  Activity Ambulated with assistance in hallway  Level of Assistance Standby assist, set-up cues, supervision of patient - no hands on  Assistive Device Other (Comment) (IV pole)  Distance Ambulated (ft) 500 ft  Activity Response Tolerated well  Mobility Referral Yes  $Mobility charge 1 Mobility   Pt received in bed and agreed for mobility, no c/o pain nor discomfort during ambulation. Pt back to bed with all needs met and family in room.   Roderick Pee Mobility Specialist

## 2021-12-25 NOTE — H&P (Addendum)
History and Physical    Patient: Colin Love QMV:[id number] DOB: 1945-04-25 DOA: 12/25/2021 DOS: the patient was seen and examined on 12/25/2021 PCP: Binnie Rail, MD  Patient coming from: Home  Chief Complaint:  Chief Complaint  Patient presents with   Chills   HPI: Colin Love is a 76 y.o. male with medical history significant of chronic atrial fibrillation, history of PACs, sleep apnea, left adrenal gland adenoma, bladder cancer, BPH, diverticulosis, diverticulitis, GERD, colon polyps, hyperlipidemia, hypertension, type 2 diabetes, glaucoma who presented to the emergency department with progressively worse generalized body aches, lightheadedness, night sweats, chills and fatigue since Sunday.  Yesterday his chills were so bad that he was experiencing significant rigors prompting him to go to the emergency department.  He has been having nonproductive cough and mild dyspnea.  He had an episode of wheezing.  He still smokes about 10 cigarettes a day. He denied hemoptysis.  He stated that his symptoms began over the weekend with what seem to be URI symptoms and then became worse.  No chest pain, palpitations, diaphoresis, PND, orthopnea or pitting edema of the lower extremities.  No abdominal pain, nausea, emesis, diarrhea, constipation, melena or hematochezia.  No flank pain, dysuria, frequency or hematuria.  No polyuria, polydipsia, polyphagia or blurred vision.   ED course: Initial vital signs were temperature 100.1 F, but subsequently rose to 103 F about 3 hours after arrival to the emergency department.  Pulse 104, respiration 18, BP 142/77 mmHg O2 sat 96% on room air.  Patient received ceftriaxone and Zithromax.  Lab work: Urinalysis with small hemoglobinuria and mild proteinuria 30 mg/dL.  CBC showed a white count 12.7, hemoglobin 14.5 g/dL platelets 182.  CMP was potassium of 3.1 mmol/L.  Glucose 126, BUN 23, creatinine 1.3 and calcium 10.8 mg/dL.  AST was 85 ALT 66.  Sodium  and chloride correct to hyperglycemia.  Repeat labs this morning showed resolved hypercalcemia, mild protein malnutrition, worsening leukocytosis and mild normocytic anemia 12.7 g/dL.  Imaging: 2 view chest radiographs showed right midlung pneumonia.  There is normal cardiomediastinal silhouette.  There is aortic atherosclerotic calcification and cervical spine fusion.   Review of Systems: As mentioned in the history of present illness. All other systems reviewed and are negative.  Past Medical History:  Diagnosis Date   A-fib ([place])    Adenoma of left adrenal gland    At risk for sleep apnea    STOP-BANG= 4    SENT TO PCP 02-06-2014   Bladder cancer ([place]) 02/06/2014   local, low grade pap uro carcinoma   BPH (benign prostatic hypertrophy)    Diverticulosis of colon    Full dentures    GERD (gastroesophageal reflux disease)    History of adenomatous polyp of colon    History of diverticulitis of colon    Hyperlipidemia    Hypertension    Murmur, cardiac    PAC (premature atrial contraction)    Type 2 diabetes mellitus [facility])    Past Surgical History:  Procedure Laterality Date   ANTERIOR CERVICAL DECOMP/DISCECTOMY FUSION  05-24-2003   C4  --  C7   CARDIOVERSION N/A 04/24/2021   Procedure: CARDIOVERSION;  Surgeon: Thayer Headings, MD;  Location: [facility];  Service: Cardiovascular;  Laterality: N/A;   COLONOSCOPY W/ POLYPECTOMY  last one 07-24-2013 w/ EGD   CYSTOSCOPY W/ RETROGRADES Bilateral 02/09/2014   Procedure: BILATERAL RETROGRADE PYELOGRAM , Cystoscopy;  Surgeon: Ardis Hughs, MD;  [facility];  Service: Urology;  Laterality: Bilateral;   TRANSURETHRAL RESECTION OF BLADDER TUMOR WITH GYRUS (TURBT-GYRUS) N/A 02/09/2014   Procedure: Bladder Biopsies;  Surgeon: Ardis Hughs, MD;  Location: [facility];  Service: Urology;  Laterality: N/A;   Social History:  reports that he has been smoking cigarettes. He has a 20.00 pack-year  smoking history. He has never used smokeless tobacco. He reports current alcohol use. He reports that he does not use drugs.  No Known Allergies  Family History  Problem Relation Age of Onset   Colon cancer Mother    Hypertension Mother    Hyperlipidemia Mother    Hypertension Father    Hyperlipidemia Father    Diabetes Brother     Prior to Admission medications   Medication Sig Start Date End Date Taking? Authorizing Provider  ACCU-CHEK SOFTCLIX LANCETS lancets TEST UP TO 4 TIMES A DAY 11/29/17   Binnie Rail, MD  acetaminophen (TYLENOL) 500 MG tablet Take 1,000 mg by mouth every 6 (six) hours as needed for mild pain.    [provider]  apixaban (ELIQUIS) 5 MG TABS tablet TAKE 1 TABLET BY MOUTH TWICE A DAY 11/17/21   Jerline Pain, MD  atorvastatin (LIPITOR) 20 MG tablet TAKE 1 TABLET BY MOUTH ONCE DAILY AT 6PM 07/30/21   Binnie Rail, MD  blood glucose meter kit and supplies KIT Dispense based on patient and insurance preference. Use up to four times daily as directed. (FOR ICD-9 250.00, 250.01). 05/21/15   Binnie Rail, MD  diclofenac Sodium (VOLTAREN) 1 % GEL Apply 2 g topically daily as needed (pain).    [provider]  diltiazem (CARDIZEM CD) 180 MG 24 hr capsule TAKE 1 CAPSULE BY MOUTH TWICE A DAY 07/30/21   Burns, Claudina Lick, MD  gabapentin (NEURONTIN) 100 MG capsule Take 1-2 capsules (100-200 mg total) by mouth daily as needed. 02/07/19   Burns, Claudina Lick, MD  latanoprost (XALATAN) 0.005 % ophthalmic solution Place 1 drop into both eyes at bedtime.  10/17/15   [provider]  lisinopril-hydrochlorothiazide (ZESTORETIC) 20-12.5 MG tablet TAKE 1 TABLET BY MOUTH TWICE A DAY 07/30/21   Burns, Claudina Lick, MD  meloxicam (MOBIC) 15 MG tablet Take 15 mg by mouth daily. 11/27/19   [provider]  metFORMIN (GLUCOPHAGE) 500 MG tablet TAKE 1 TABLET BY MOUTH TWICE A DAY WITH MEALS 12/22/21   Burns, Claudina Lick, MD  ONE TOUCH ULTRA TEST test strip TEST UP TO 4 TIMES  DAILY 05/27/18   Binnie Rail, MD  potassium chloride (KLOR-CON) 10 MEQ tablet TAKE 3 TABLETS BY MOUTH DAILY 07/11/21   Binnie Rail, MD  tamsulosin (FLOMAX) 0.4 MG CAPS capsule Take 0.4 mg by mouth daily. 12/31/19   [provider]  terazosin (HYTRIN) 10 MG capsule TAKE 1 CAPSULE BY MOUTH EVERYDAY AT BEDTIME 10/27/21   Binnie Rail, MD  Varenicline Tartrate, Starter, 0.5 MG X 11 & 1 MG X 42 TBPK TAKE AS DIRECTED 12/22/21   Binnie Rail, MD    Physical Exam: Vitals:   12/25/21 0200 12/25/21 0300 12/25/21 0400 12/25/21 0443  BP: 111/68 103/71 110/74 108/75  Pulse: 95 93 89 93  Resp: (!) 32 (!) 34 (!) 23 18  Temp:  99.3 F (37.4 C)  98.6 F (37 C)  TempSrc:  Oral  Oral  SpO2: 91% 92% 94% 94%  Weight:    86.4 kg  Height:    _0  (1.702 m)  Physical Exam Vitals and nursing note reviewed.  Constitutional:      General: He is awake. He is not in acute distress.    Appearance: Normal appearance. He is overweight.  HENT:     Head: Normocephalic.     Nose: No rhinorrhea.     Mouth/Throat:     Mouth: Mucous membranes are moist.  Eyes:     General: No scleral icterus.    Pupils: Pupils are equal, round, and reactive to light.  Neck:     Vascular: No JVD.  Cardiovascular:     Rate and Rhythm: Normal rate.     Heart sounds: S1 normal and S2 normal.  Pulmonary:     Effort: Pulmonary effort is normal. No tachypnea or accessory muscle usage.     Breath sounds: Examination of the right-middle field reveals rales. Examination of the right-lower field reveals rales. Rhonchi and rales present. No wheezing.  Abdominal:     General: Bowel sounds are normal. There is no distension.     Palpations: Abdomen is soft.     Tenderness: There is no abdominal tenderness. There is no guarding.  Musculoskeletal:     Cervical back: Neck supple.     Right lower leg: No edema.     Left lower leg: No edema.  Skin:    General: Skin is warm and dry.  Neurological:     General: No focal  deficit present.     Mental Status: He is alert and oriented to person, place, and time.  Psychiatric:        Mood and Affect: Mood normal.        Behavior: Behavior normal. Behavior is cooperative.   Data Reviewed:  There are no new results to review at this time.  04/14/2021 echocardiogram IMPRESSIONS    1. Left ventricular ejection fraction, by estimation, is 55 to 60%. The  left ventricle has normal function. The left ventricle has no regional  wall motion abnormalities. There is moderate asymmetric left ventricular  hypertrophy of the basal-septal  segment. Left ventricular diastolic parameters are indeterminate.   2. Right ventricular systolic function is normal. The right ventricular  size is mildly enlarged. There is normal pulmonary artery systolic  pressure. The estimated right ventricular systolic pressure is 27.0 mmHg.   3. Left atrial size was severely dilated.   4. Right atrial size was severely dilated.   5. The mitral valve is normal in structure. Trivial mitral valve  regurgitation. No evidence of mitral stenosis.   6. The aortic valve is tricuspid. Aortic valve regurgitation is not  visualized. No aortic stenosis is present.   7. The inferior vena cava is normal in size with greater than 50%  respiratory variability, suggesting right atrial pressure of 3 mmHg.   Assessment and Plan: Principal Problem:   RML pneumonia In a patient with history of:   Tobacco dependence due to cigarettes Currently smoking about 10 cigarettes/day Admit to MedSurg/inpatient. Continue supplemental oxygen. Scheduled and as needed bronchodilators. Continue ceftriaxone 1 g IVPB daily. Continue azithromycin 500 mg IVPB daily. Check strep pneumoniae urinary antigen. Check sputum Gram stain, culture and sensitivity. Follow-up blood culture and sensitivity. Follow-up CBC and chemistry in the morning. Smoking cessation advised.  Active Problems:   Hypokalemia Replacing. Magnesium  was supplemented. Follow-up potassium level.    Moderate protein malnutrition (HCC) Protein supplementation. Continue nutritional services evaluation.    Transaminitis Volume depletion? Atorvastatin use? Check acute hepatitis panel. Hold statin for now. Follow transaminases in  AM.    Diabetes type 2, controlled ([place]) Carbohydrate modified diet. Continue metformin 500 mg p.o. twice daily. CBG monitoring with [facility] while in the hospital.    Dyslipidemia On atorvastatin. Currently being held.    Essential hypertension Was slightly volume depleted. Creatinine elevated, no AKI criteria Hold HCTZ and lisinopril today. Resume in the morning. Continue IV fluids for today. Continue diltiazem 180 mg p.o. twice daily. Continue terazosin 10 mg at bedtime. Monitor blood pressure and heart rate. Monitor renal function electrolytes.    Glaucoma Continue Xalatan drops at bedtime.    BPH (benign prostatic hyperplasia) Also history of bladder cancer. Continue tamsulosin 0.4 mg p.o. daily. Continue terazosin 10 mg p.o. at bedtime. Follow-up with urology as an outpatient.    Persistent atrial fibrillation (HCC) CHA2DS2-VASc Score of at least 5. Continue apixaban 5 mg p.o. twice daily. Continue Cardizem 180 mg p.o. twice daily.    Gastroesophageal reflux disease Begin pantoprazole 40 mg p.o. daily.    Normocytic anemia Check stool occult blood. Monitor hematocrit and hemoglobin.     Advance Care Planning:   Code Status: Full Code   Consults:   Family Communication: His spouse was in his room.  Severity of Illness: The appropriate patient status for this patient is INPATIENT. Inpatient status is judged to be reasonable and necessary in order to provide the required intensity of service to ensure the patient's safety. The patient's presenting symptoms, physical exam findings, and initial radiographic and laboratory data in the context of their chronic comorbidities is felt to  place them at high risk for further clinical deterioration. Furthermore, it is not anticipated that the patient will be medically stable for discharge from the hospital within 2 midnights of admission.   * I certify that at the point of admission it is my clinical judgment that the patient will require inpatient hospital care spanning beyond 2 midnights from the point of admission due to high intensity of service, high risk for further deterioration and high frequency of surveillance required.*  Author: Reubin Milan, MD 12/25/2021 7:14 AM  For on call review [email or web address].   This document was prepared using Dragon voice recognition software and may contain some unintended transcription errors.

## 2021-12-25 NOTE — Telephone Encounter (Signed)
Patient was seen in ED.  Nurse Assessment Nurse: Rayburn Felt RN, Colletta [place] Date/Time (Eastern Time): 12/24/2021 7:57:38 PM Confirm and document reason for call. If symptomatic, describe symptoms. ---Caller stated that her husband has chills/shaking for the past hour. Caller stated that it started Sunday evening but it was worse tonight. Caller stated that he was seen in the office yesterday for it. Caller stated that he has a cough. Caller stated that the chills have stopped now. No thermometer to check temp. Caller stated that he had to change clothes 2 times for sweating last night. Caller stated that he is takingn tylenol, last dose this morning. Negative for covid and flu. Caller denies any other symptoms. Does the patient have any new or worsening symptoms? ---Yes Will a triage be completed? ---Yes Related visit to physician within the last 2 weeks? ---Yes Does the PT have any chronic conditions? (i.e. diabetes, asthma, this includes High risk factors for pregnancy, etc.) ---Yes List chronic conditions. ---DM Type II, chronic leg pain, HTN Is this a behavioral health or substance abuse call? ---No   Guidelines Guideline Title Affirmed Question Affirmed Notes Nurse Date/Time (Eastern Time) Cough - Acute NonProductive  Patient sounds very sick or weak to the triager Bridger, RN, Colletta Maryland 12/24/2021 8:08:29 PM Disp. Time Eilene Ghazi Time) Disposition Final User 12/24/2021 8:13:22 PM Go to ED Now (or PCP triage) Yes Rayburn Felt, RN, Colletta [place] Final Disposition 12/24/2021 8:13:22 PM Go to ED Now (or PCP triage) Yes Rayburn Felt, RN, [place] Newer Disagree/Comply Comply Caller Understands Yes PreDisposition Did not know what to do Care Advice Given Per Guideline GO TO ED NOW (OR PCP TRIAGE): * IF NO PCP (PRIMARY CARE PROVIDER) SECOND-LEVEL TRIAGE: You need to be seen within the next hour. Go to the [facility] at _____________ [place] as soon as you can. CALL EMS 911 IF: * Severe difficulty  breathing occurs * Lips or face turns blue * Confusion occurs. CARE ADVICE given per Cough - Acute Non-Productive (Adult) guideline. Comments User: Elmer Picker, RN Date/Time Eilene Ghazi Time): 12/24/2021 8:03:09 PM Blood glucose 152 User: Elmer Picker, RN Date/Time Eilene Ghazi Time): 12/24/2021 8:09:24 PM Caller stated that he was wheezing earlier tonight, none now. User: Elmer Picker, RN Date/Time Eilene Ghazi Time): 12/24/2021 8:10:14 PM More weak Referrals GO TO FACILITY OTHER - SPECIFY

## 2021-12-25 NOTE — Progress Notes (Signed)
  [facility] will assume care on arrival to accepting facility. Until arrival, care as per EDP. However, [facility] available 24/7 for questions and assistance.   Nursing staff please page [facility] Admits and Consults ([phone]) as soon as the patient arrives to the hospital.  Maryln Eastham, DO [facility]  

## 2021-12-26 DIAGNOSIS — A419 Sepsis, unspecified organism: Secondary | ICD-10-CM

## 2021-12-26 DIAGNOSIS — E118 Type 2 diabetes mellitus with unspecified complications: Secondary | ICD-10-CM | POA: Diagnosis not present

## 2021-12-26 DIAGNOSIS — E785 Hyperlipidemia, unspecified: Secondary | ICD-10-CM | POA: Diagnosis not present

## 2021-12-26 DIAGNOSIS — J189 Pneumonia, unspecified organism: Secondary | ICD-10-CM | POA: Diagnosis not present

## 2021-12-26 LAB — COMPREHENSIVE METABOLIC PANEL WITH GFR
ALT: 38 U/L (ref 0–44)
AST: 30 U/L (ref 15–41)
Albumin: 2.5 g/dL — ABNORMAL LOW (ref 3.5–5.0)
Alkaline Phosphatase: 64 U/L (ref 38–126)
Anion gap: 5 (ref 5–15)
BUN: 23 mg/dL (ref 8–23)
CO2: 26 mmol/L (ref 22–32)
Calcium: 9.4 mg/dL (ref 8.9–10.3)
Chloride: 105 mmol/L (ref 98–111)
Creatinine, Ser: 0.74 mg/dL (ref 0.61–1.24)
GFR, Estimated: >60 mL/min
Glucose, Bld: 115 mg/dL — ABNORMAL HIGH (ref 70–99)
Potassium: 3.5 mmol/L (ref 3.5–5.1)
Sodium: 136 mmol/L (ref 135–145)
Total Bilirubin: 0.6 mg/dL (ref 0.3–1.2)
Total Protein: 5.8 g/dL — ABNORMAL LOW (ref 6.5–8.1)

## 2021-12-26 LAB — GLUCOSE, CAPILLARY
Glucose-Capillary: 113 mg/dL — ABNORMAL HIGH (ref 70–99)
Glucose-Capillary: 124 mg/dL — ABNORMAL HIGH (ref 70–99)
Glucose-Capillary: 119 mg/dL — ABNORMAL HIGH (ref 70–99)
Glucose-Capillary: 122 mg/dL — ABNORMAL HIGH (ref 70–99)

## 2021-12-26 LAB — CBC
HCT: 35.3 % — ABNORMAL LOW (ref 39.0–52.0)
Hemoglobin: 11.9 g/dL — ABNORMAL LOW (ref 13.0–17.0)
MCH: 29 pg (ref 26.0–34.0)
MCHC: 33.7 g/dL (ref 30.0–36.0)
MCV: 86.1 fL (ref 80.0–100.0)
Platelets: 172 10*3/uL (ref 150–400)
RBC: 4.1 MIL/uL — ABNORMAL LOW (ref 4.22–5.81)
RDW: 14.6 % (ref 11.5–15.5)
WBC: 14 10*3/uL — ABNORMAL HIGH (ref 4.0–10.5)
nRBC: 0 % (ref 0.0–0.2)

## 2021-12-26 NOTE — Assessment & Plan Note (Signed)
See above

## 2021-12-26 NOTE — Assessment & Plan Note (Signed)
Mild, stable, no clinical bleeding

## 2021-12-26 NOTE — Assessment & Plan Note (Signed)
Smoking cessation recommended 

## 2021-12-26 NOTE — Assessment & Plan Note (Signed)
Heart rate controlled - Continue apixaban - Continue diltiazem

## 2021-12-26 NOTE — Progress Notes (Signed)
  Progress Note   Patient: Colin Love EKC:[id number] DOB: July 04, 1945 DOA: 12/25/2021     1 DOS: the patient was seen and examined on 12/26/2021 8:50 AM      Brief hospital course: Colin Love is a 76 y.o. M with cAF, smoking, OSA, bladder CA, HTN, and DM who presented with cough, rigors.   In the ER, found to have RML pneumonia, temp 103F, tachypnea >30.     Assessment and Plan: * Sepsis without end organ damage P/w fever, tachypnea, leukocytosis without end organ damage.  Source RML pneumonia.  PSI 127, risk class IV. - Continue Rocephin and azithromycin - Follow sputum and urinary antigens - Smoking cessation recommended  Community acquired pneumonia of right middle lobe of lung See above  Normocytic anemia Mild, stable, no clinical bleeding  Transaminitis Due to sepsis, resolved with fluids.  Moderate protein malnutrition (HCC)    Hypokalemia - Supplement potassium  Persistent atrial fibrillation (HCC) Heart rate controlled - Continue apixaban - Continue diltiazem  Tobacco dependence due to cigarettes Smoking cessation recommended  Essential hypertension Blood pressure normal - Continue diltiazem, HCTZ, lisinopril, terazosin  Dyslipidemia -Hold atorvastatin given LFTs  Diabetes type 2, controlled ([place]) - Continue metformin - Continue sliding scale corrections          Subjective: No fever, no confusion.  Appetite good.  Energy improving.  No respiratory distress, dyspnea.  Some cough.     Physical Exam: BP 109/72 (BP Location: Right Arm)   Pulse (!) 44   Temp 98 F (36.7 C) (Oral)   Resp 18   Ht '5\' 7"'$  (1.702 m)   Wt 86.4 kg   SpO2 93%   BMI 29.83 kg/m   Elderly adult male, lying in bed, and drinking coffee, watching television RRR, no murmurs, no peripheral edema Respiratory rate normal, lungs clear without rales or wheezes Abdomen soft without tenderness palpation or guarding, no ascites or distention Attention normal,  affect appropriate, judgment insight appear normal    Data Reviewed: Basic metabolic panel unremarkable Chest x-ray showed right middle lobe opacity AST and ALT elevated, normalized overnight White blood cell count down to 14 Hematocrit 35    Family Communication: Wife at the bedside    Disposition: Status is: Inpatient Patient presented with pneumonia, given his age, respiratory rate, and history of cancer, he has a PSI of 127, requires IV antibiotics in the hospital, if he remains stable tomorrow, we may be able to transition to home soon.        Author: Edwin Dada, MD 12/26/2021 1:05 PM  For on call review [email or web address].

## 2021-12-26 NOTE — Assessment & Plan Note (Signed)
-   Continue metformin - Continue sliding scale corrections

## 2021-12-26 NOTE — Assessment & Plan Note (Signed)
Treated and resolved 

## 2021-12-26 NOTE — TOC Progression Note (Signed)
Transition of Care [facility]) - Progression Note    Patient Details  Name: Colin Love MRN: [id number] Date of Birth: 10-16-1945  Transition of Care [facility]) CM/SW [place], RN Phone Number:[phone]  12/26/2021, 12:09 PM  Clinical Narrative:     Transition of Care [facility]) Screening Note   Patient Details  Name: Colin Love Date of Birth: 08/07/1945   Transition of Care [facility]) CM/SW Contact:    Angelita Ingles, RN Phone Number: 12/26/2021, 12:09 PM    Transition of Care Department [facility]) has reviewed patient and no TOC needs have been identified at this time. We will continue to monitor patient advancement through interdisciplinary progression rounds. If new patient transition needs arise, please place a TOC consult.          Expected Discharge Plan and Services                                                 Social Determinants of Health (SDOH) Interventions    Readmission Risk Interventions     No data to display

## 2021-12-26 NOTE — Assessment & Plan Note (Signed)
Due to sepsis, resolved with fluids.

## 2021-12-26 NOTE — Progress Notes (Signed)
Mobility Specialist - Progress Note   12/26/21 1305  Mobility  Activity Ambulated with assistance in hallway  Level of Assistance Standby assist, set-up cues, supervision of patient - no hands on  Assistive Device None  Distance Ambulated (ft) 1000 ft  Activity Response Tolerated well  Mobility Referral Yes  $Mobility charge 1 Mobility   Pt received in bed and agreed to mobility, no c/o pain nor discomfort. Pt back to chair with all needs met and family in room.  Roderick Pee Mobility Specialist

## 2021-12-26 NOTE — Assessment & Plan Note (Signed)
Blood pressure normal - Continue diltiazem, HCTZ, lisinopril, terazosin

## 2021-12-26 NOTE — Hospital Course (Signed)
Mr. Mall is a 75 y.o. M with cAF, smoking, OSA, bladder CA, HTN, and DM who presented with cough, rigors.   In the ER, found to have RML pneumonia, temp 103F, tachypnea >30.

## 2021-12-26 NOTE — Assessment & Plan Note (Signed)
-  Hold atorvastatin given LFTs

## 2021-12-26 NOTE — Progress Notes (Signed)
PT Cancellation Note  Patient Details Name: Colin Love MRN: [id number] DOB: 09-16-1945   Cancelled Treatment:     Order received for PT. Spoke with mobility specialist who has been seeign this patietn and mobilizing. Pt is walking in hallway with supervision and no AD. No PT needs seen at this time, PT will sign off and pt will continue to mobilize with mobility specialist.   Thank you.     Clide Dales 12/26/2021, 11:39 AM Gatha Mayer, PT, MPT Acute Rehabilitation Services Office: [phone] If a weekend: [facility] w/e pager [phone] 12/26/2021

## 2021-12-26 NOTE — Assessment & Plan Note (Signed)
P/w fever, tachypnea, leukocytosis without end organ damage.  Source RML pneumonia.  PSI 127, risk class IV. - Continue Rocephin and azithromycin - Follow sputum and urinary antigens - Smoking cessation recommended

## 2021-12-27 ENCOUNTER — Other Ambulatory Visit (HOSPITAL_COMMUNITY): Payer: Self-pay

## 2021-12-27 DIAGNOSIS — E785 Hyperlipidemia, unspecified: Secondary | ICD-10-CM | POA: Diagnosis not present

## 2021-12-27 DIAGNOSIS — A419 Sepsis, unspecified organism: Secondary | ICD-10-CM | POA: Diagnosis not present

## 2021-12-27 DIAGNOSIS — E118 Type 2 diabetes mellitus with unspecified complications: Secondary | ICD-10-CM | POA: Diagnosis not present

## 2021-12-27 DIAGNOSIS — J189 Pneumonia, unspecified organism: Secondary | ICD-10-CM | POA: Diagnosis not present

## 2021-12-27 LAB — BASIC METABOLIC PANEL WITH GFR
Anion gap: 6 (ref 5–15)
BUN: 20 mg/dL (ref 8–23)
CO2: 25 mmol/L (ref 22–32)
Calcium: 9.7 mg/dL (ref 8.9–10.3)
Chloride: 106 mmol/L (ref 98–111)
Creatinine, Ser: 0.86 mg/dL (ref 0.61–1.24)
GFR, Estimated: >60 mL/min
Glucose, Bld: 108 mg/dL — ABNORMAL HIGH (ref 70–99)
Potassium: 3.8 mmol/L (ref 3.5–5.1)
Sodium: 137 mmol/L (ref 135–145)

## 2021-12-27 LAB — CBC
HCT: 37.8 % — ABNORMAL LOW (ref 39.0–52.0)
Hemoglobin: 12.3 g/dL — ABNORMAL LOW (ref 13.0–17.0)
MCH: 28.4 pg (ref 26.0–34.0)
MCHC: 32.5 g/dL (ref 30.0–36.0)
MCV: 87.3 fL (ref 80.0–100.0)
Platelets: 210 10*3/uL (ref 150–400)
RBC: 4.33 MIL/uL (ref 4.22–5.81)
RDW: 14.6 % (ref 11.5–15.5)
WBC: 11.7 10*3/uL — ABNORMAL HIGH (ref 4.0–10.5)
nRBC: 0 % (ref 0.0–0.2)

## 2021-12-27 LAB — GLUCOSE, CAPILLARY
Glucose-Capillary: 111 mg/dL — ABNORMAL HIGH (ref 70–99)
Glucose-Capillary: 141 mg/dL — ABNORMAL HIGH (ref 70–99)

## 2021-12-27 MED ORDER — NICOTINE 14 MG/24HR TD PT24: 14.0000 mg | MEDICATED_PATCH | Freq: Every day | TRANSDERMAL | 0 refills | Status: DC

## 2021-12-27 MED ORDER — CEFDINIR 300 MG PO CAPS
300.0000 mg | ORAL_CAPSULE | Freq: Two times a day (BID) | ORAL | 0 refills | Status: DC
  Filled 2021-12-27: qty 6, 3d supply, fill #0

## 2021-12-27 MED ORDER — AZITHROMYCIN 250 MG PO TABS
250.0000 mg | ORAL_TABLET | Freq: Every day | ORAL | 0 refills | Status: DC
  Filled 2021-12-27: qty 3, 3d supply, fill #0

## 2021-12-27 NOTE — Discharge Summary (Signed)
Physician Discharge Summary   Patient: Colin Love MRN: [id number] DOB: [birth year]  Admit date:     12/25/2021  Discharge date: 12/27/21  Discharge Physician: Edwin Dada   PCP: Binnie Rail, MD     Recommendations at discharge:  Follow up with PCP Dr. Quay Burow in 1 week     Discharge Diagnoses: Principal Problem:   Sepsis without end organ damage Active Problems:   Community acquired pneumonia of right middle lobe of lung   Diabetes type 2, controlled ([place])   Dyslipidemia   Essential hypertension   Tobacco dependence due to cigarettes   Persistent atrial fibrillation (HCC)   Hypokalemia   Moderate protein malnutrition (HCC)   Transaminitis   Normocytic anemia      Hospital Course: Mr. Gucciardo is a 76 y.o. M with cAF, smoking, OSA, bladder CA, HTN, and DM who presented with cough, rigors.   In the ER, found to have RML pneumonia, temp 103F, tachypnea >30.     * Sepsis without end organ damage due to pneumonia of RML Treated with Rocephin and azithromycin.  Patient now mentating at baseline, taking orals.  Temp < 100 F, heart rate < 100bpm, RR < 24, SpO2 at baseline.   Stable for discharge.    Smoking cessation recommended.  Discharged back on Chantix which he had never started and nicotine patches.    Given 3 more days cefdinir and azithromycin for total 5 days.     Transaminitis Due to sepsis, resolved with fluids.  Tobacco dependence due to cigarettes Smoking cessation recommended, modalities discussed.             The [facility] Controlled Substances Registry was reviewed for this patient prior to discharge.   Consultants: None needed Procedures performed: None  Disposition: Home   DISCHARGE MEDICATION: Allergies as of 12/27/2021   No Known Allergies      Medication List     TAKE these medications    acetaminophen 500 MG tablet Commonly known as: TYLENOL Take 1,000 mg by mouth every 6 (six) hours as needed  for mild pain.   atorvastatin 20 MG tablet Commonly known as: LIPITOR TAKE 1 TABLET BY MOUTH ONCE DAILY AT 6PM What changed: See the new instructions.   azithromycin 250 MG tablet Commonly known as: Zithromax Z-Pak Take 1 tablet (250 mg total) by mouth daily. Take 2 tablets (500 mg) on  Day 1,  followed by 1 tablet (250 mg) once daily on Days 2 through 5.   cefdinir 300 MG capsule Commonly known as: OMNICEF Take 1 capsule (300 mg total) by mouth 2 (two) times daily.   diclofenac Sodium 1 % Gel Commonly known as: VOLTAREN Apply 2 g topically daily as needed (pain).   diltiazem 180 MG 24 hr capsule Commonly known as: CARDIZEM CD TAKE 1 CAPSULE BY MOUTH TWICE A DAY What changed: when to take this   Eliquis 5 MG Tabs tablet Generic drug: apixaban TAKE 1 TABLET BY MOUTH TWICE A DAY What changed: how much to take   gabapentin 100 MG capsule Commonly known as: NEURONTIN Take 1-2 capsules (100-200 mg total) by mouth daily as needed. What changed: reasons to take this   latanoprost 0.005 % ophthalmic solution Commonly known as: XALATAN Place 1 drop into both eyes at bedtime.   lisinopril-hydrochlorothiazide 20-12.5 MG tablet Commonly known as: ZESTORETIC TAKE 1 TABLET BY MOUTH TWICE A DAY What changed: when to take this   metFORMIN 500 MG tablet Commonly known as:  GLUCOPHAGE TAKE 1 TABLET BY MOUTH TWICE A DAY WITH MEALS What changed:  how much to take how to take this when to take this additional instructions   nicotine 14 mg/24hr patch Commonly known as: NICODERM CQ - dosed in mg/24 hours Place 1 patch (14 mg total) onto the skin daily.   potassium chloride 10 MEQ tablet Commonly known as: KLOR-CON TAKE 3 TABLETS BY MOUTH DAILY   tamsulosin 0.4 MG Caps capsule Commonly known as: FLOMAX Take 0.4 mg by mouth daily.   terazosin 10 MG capsule Commonly known as: HYTRIN TAKE 1 CAPSULE BY MOUTH EVERYDAY AT BEDTIME What changed: See the new instructions.    Varenicline Tartrate (Starter) 0.5 MG X 11 & 1 MG X 42 Tbpk TAKE AS DIRECTED        Follow-up Information     Binnie Rail, MD. Schedule an appointment as soon as possible for a visit in 1 week(s).   Specialty: Internal Medicine Contact information: [place] [address] [phone]                 Discharge Instructions     Discharge instructions   Complete by: As directed    IMPORTANT DISCHARGE INSTRUCTIONS:   From Dr. Loleta Books: You were admitted for shaking and rigors. Here, we found that you had a new infiltrate on your Chest x-ray (appearing like pneumonia) and an elevated White blood cell count, suggesting infection  You were treated with antibiotics and improved.  You should complete treatment by taking the antibiotics azithromycin and cefdinir for 3 more days, starting tonight Take azithromycin 250 mg nightly for 3 days Take cefdinir/Omnicef 300 mg twice daily for 3 more days starting tonight   Use the incentive spirometer for the next week (One or two deep breaths in it, about every hour or so)  Go see Dr. Quay Burow in 1 week   For smoking cessation: Use nicotine patches as directed on the package Start with the level 2 (14 mg patches)  It is safe and smart to use this with varenicline/Chantix  Start the Chantix as directed on the package (0.5 mg to start, tapering up to the full dose as directed on the pack)   Increase activity slowly   Complete by: As directed        Discharge Exam: Filed Weights   12/24/21 2150 12/25/21 0443  Weight: 86.2 kg 86.4 kg    General: Pt is alert, awake, not in acute distress Cardiovascular: RRR, nl S1-S2, no murmurs appreciated.   No LE edema.   Respiratory: Normal respiratory rate and rhythm.  CTAB without rales or wheezes. Abdominal: Abdomen soft and non-tender.  No distension or HSM.   Neuro/Psych: Strength symmetric in upper and lower extremities.  Judgment and insight appear  normal.   Condition at discharge: good  The results of significant diagnostics from this hospitalization (including imaging, microbiology, ancillary and laboratory) are listed below for reference.   Imaging Studies: DG Chest 2 View  Result Date: 12/24/2021 CLINICAL DATA:  Fever, chills, and body aches EXAM: CHEST - 2 VIEW COMPARISON:  Radiographs 05/22/2003 FINDINGS: Hazy opacity in the right mid lung compatible with pneumonia. No pleural effusion or pneumothorax. Normal cardiomediastinal silhouette. Aortic atherosclerotic calcification. Cervical spine fusion. No acute osseous abnormality. IMPRESSION: Right mid lung pneumonia. Electronically Signed   By: Placido Sou M.D.   On: 12/24/2021 22:39    Microbiology: Results for orders placed or performed in visit on 12/23/21  Urine Culture  Status: None   Collection Time: 12/23/21  2:44 PM   Specimen: Urine  Result Value Ref Range Status   Source: URINE  Final   Status: FINAL  Final   Result: No Growth  Final    Labs: CBC: Recent Labs  Lab 12/24/21 2200 12/25/21 0810 12/26/21 0543 12/27/21 0644  WBC 12.7* 20.4* 14.0* 11.7*  NEUTROABS 12.0* 18.0*  --   --   HGB 14.5 12.7* 11.9* 12.3*  HCT 43.2 38.9* 35.3* 37.8*  MCV 85.4 87.0 86.1 87.3  PLT 182 178 172 830   Basic Metabolic Panel: Recent Labs  Lab 12/24/21 2200 12/25/21 0810 12/26/21 0543 12/27/21 0644  NA 134* 134* 136 137  K 3.1* 3.5 3.5 3.8  CL 96* 101 105 106  CO2 '26 26 26 25  '$ GLUCOSE 226* 195* 115* 108*  BUN 23 24* 23 20  CREATININE 1.30* 1.06 0.74 0.86  CALCIUM 10.8* 9.5 9.4 9.7  MG  --  1.7  --   --   PHOS  --  4.2  --   --    Liver Function Tests: Recent Labs  Lab 12/24/21 2200 12/25/21 0810 12/26/21 0543  AST 85* 58* 30  ALT 66* 55* 38  ALKPHOS 74 61 64  BILITOT 0.9 0.9 0.6  PROT 7.6 6.3* 5.8*  ALBUMIN 4.0 2.8* 2.5*   CBG: Recent Labs  Lab 12/26/21 1141 12/26/21 1625 12/26/21 2041 12/27/21 0737 12/27/21 1115  GLUCAP 124* 119* 122*  111* 141*    Discharge time spent: approximately 35 minutes spent on discharge counseling, evaluation of patient on day of discharge, and coordination of discharge planning with nursing, social work, pharmacy and case management  Signed: Edwin Dada, MD [facility] 12/27/2021

## 2021-12-27 NOTE — Progress Notes (Signed)
Patient discharged to home with family, Discharge instructions reviewed with patient who verbalized understanding.

## 2021-12-31 ENCOUNTER — Telehealth: Payer: Self-pay | Admitting: *Deleted

## 2021-12-31 ENCOUNTER — Encounter: Payer: Self-pay | Admitting: *Deleted

## 2021-12-31 NOTE — Patient Outreach (Addendum)
  Care Coordination [facility] Note Transition Care Management Follow-up Telephone Call Date of discharge and from where: Saturday, 12/27/21 Colin Love; CAP- sepsis How have you been since you were released from the hospital? "I am doing really much better since I was released from the hospital, not having any problems at all.  I completed my antibiotics that they gave me, took them all just like they told me to.  I have not smoked since I got home from the hospital and am using the patches that they gave me, so far, I am not struggling with cravings and I hope that continues.  My wife is here helping me with anything I need, but I have pretty much been doing everything for myself to take care of myself.  I plan to talk to Dr. Quay Burow on Monday during my appointment and see if she can give me the flu vaccine for this year" Any questions or concerns? No  Items Reviewed: Did the pt receive and understand the discharge instructions provided? Yes  Medications obtained and verified? Yes  Other? No  Any new allergies since your discharge? No  Dietary orders reviewed? Yes Do you have support at home? Yes  spouse assisting with ADL's and iADL's as needed/ indicated  Home Care and Equipment/Supplies: Were home health services ordered? no If so, what is the name of the agency? N/A  Has the agency set up a time to come to the patient's home? not applicable Were any new equipment or medical supplies ordered?  No What is the name of the medical supply agency? N/A Were you able to get the supplies/equipment? not applicable Do you have any questions related to the use of the equipment or supplies? No N/A  Functional Questionnaire: (I = Independent and D = Dependent) ADLs: I  spouse assisting with ADL's and iADL's as needed/ indicated  Bathing/Dressing- I  Meal Prep- I  spouse assisting with ADL's and iADL's as needed/ indicated  Eating- I  Maintaining continence- I  Transferring/Ambulation- I  Managing  Meds- I  Follow up appointments reviewed:  PCP Hospital f/u appt confirmed? Yes  Scheduled to see PCP, Dr. Quay Burow on Monday 01/05/22 @ 10:40 am [place] f/u appt confirmed? No  Scheduled to see - on - @ - Are transportation arrangements needed? No  If their condition worsens, is the pt aware to call PCP or go to the Emergency Dept.? Yes Was the patient provided with contact information for the PCP's office or ED? Yes Was to pt encouraged to call back with questions or concerns? Yes  SDOH assessments and interventions completed:   Yes  Care Coordination Interventions Activated:  Yes   Care Coordination Interventions:  Reinforced importance of smoking cessation and encouraged patient to discuss any cravings he might be having with PCP; positive reinforcement and encouragement provided to patient for not having smoked since hospital discharge; discussed need to obtain flu vaccine for 2023-24 flu season- encouraged patient to inquire with PCP at upcoming scheduled office visit if it is okay for him to have vaccine after having CAP with sepsis    Encounter Outcome:  Pt. Visit Completed    Colin Rack, RN, BSN, CCRN Alumnus RN CM Care Coordination/ Transition of [place] Management [phone]: direct office

## 2022-01-02 ENCOUNTER — Other Ambulatory Visit: Payer: Self-pay | Admitting: Internal Medicine

## 2022-01-04 ENCOUNTER — Encounter: Payer: Self-pay | Admitting: Internal Medicine

## 2022-01-04 NOTE — Patient Instructions (Addendum)
? ? ? ?  ? ? ?  Medications changes include :  none  ? ? ? ? ? ?Return if symptoms worsen or fail to improve. ? ?

## 2022-01-04 NOTE — Progress Notes (Signed)
Subjective:    Patient ID: Colin Love, male    DOB: [birth year], 76 y.o.   MRN: [id number]     HPI [name] is here for follow up from the hospital.  He is here today with his wife.  10/19 - ED for worsening URI symptoms aches, lightheadedness, night sweats, chills, fatigue, dry cough, mild SOB, wheezing.  Temp in ED 103, HR 104, BP 142/77, RR 18, 96% on RA.  WBC 12.7, K 3.1, AST/ALT 85/66, Cr 1.3.  CXR RML PNA.  Received ceftriaxone, zithromax.  Admitted for Sepsis, CAP  Discharged home on 3 more days of cefdinir, azithromycin for 5 more days.   Transaminitis due to sepsis.  Resolved with fluids.   Tobacco cessation recommended. Discharged home with nicotine patch, which he is using.  He is not taking the Chantix and does not feel that he needs it.  He is committed to quitting..    Energy is low.  Appetite is low but getting better.     He is independent of all his day-to-day ADLs.  Medications and allergies reviewed with patient and updated if appropriate.  Current Outpatient Medications on File Prior to Visit  Medication Sig Dispense Refill   acetaminophen (TYLENOL) 500 MG tablet Take 1,000 mg by mouth every 6 (six) hours as needed for mild pain.     apixaban (ELIQUIS) 5 MG TABS tablet TAKE 1 TABLET BY MOUTH TWICE A DAY (Patient taking differently: Take 5 mg by mouth 2 (two) times daily.) 60 tablet 5   atorvastatin (LIPITOR) 20 MG tablet TAKE 1 TABLET BY MOUTH ONCE DAILY AT 6PM (Patient taking differently: Take 20 mg by mouth daily.) 90 tablet 3   diclofenac Sodium (VOLTAREN) 1 % GEL Apply 2 g topically daily as needed (pain).     diltiazem (CARDIZEM CD) 180 MG 24 hr capsule TAKE 1 CAPSULE BY MOUTH TWICE A DAY (Patient taking differently: Take 180 mg by mouth in the morning and at bedtime.) 180 capsule 3   gabapentin (NEURONTIN) 100 MG capsule Take 1-2 capsules (100-200 mg total) by mouth daily as needed. (Patient taking differently: Take 100-200 mg by mouth daily as  needed (Pain).) 90 capsule 3   latanoprost (XALATAN) 0.005 % ophthalmic solution Place 1 drop into both eyes at bedtime.      lisinopril-hydrochlorothiazide (ZESTORETIC) 20-12.5 MG tablet TAKE 1 TABLET BY MOUTH TWICE A DAY (Patient taking differently: Take 1 tablet by mouth in the morning and at bedtime.) 180 tablet 3   metFORMIN (GLUCOPHAGE) 500 MG tablet TAKE 1 TABLET BY MOUTH TWICE A DAY WITH MEALS (Patient taking differently: Take 500 mg by mouth 2 (two) times daily with a meal.) 180 tablet 1   nicotine (NICODERM CQ - DOSED IN MG/24 HOURS) 14 mg/24hr patch Place 1 patch (14 mg total) onto the skin daily. 28 patch 0   potassium chloride (KLOR-CON) 10 MEQ tablet TAKE 3 TABLETS BY MOUTH DAILY 270 tablet 1   tamsulosin (FLOMAX) 0.4 MG CAPS capsule Take 0.4 mg by mouth daily.     terazosin (HYTRIN) 10 MG capsule TAKE 1 CAPSULE BY MOUTH EVERYDAY AT BEDTIME (Patient taking differently: Take 10 mg by mouth at bedtime.) 90 capsule 1   No current facility-administered medications on file prior to visit.     Review of Systems  Constitutional:  Positive for appetite change and fatigue. Negative for fever.  Respiratory:  Positive for cough (once or twice a day - dry) and shortness of breath (  sometimes). Negative for chest tightness and wheezing.   Cardiovascular:  Negative for chest pain.  Gastrointestinal:  Negative for abdominal pain, diarrhea and nausea.  Neurological:  Negative for light-headedness and headaches.       Objective:   Vitals:   01/05/22 1103  BP: 116/78  Pulse: 86  Temp: 98.1 F (36.7 C)  SpO2: 99%   BP Readings from Last 3 Encounters:  01/05/22 116/78  12/27/21 120/81  12/23/21 126/86   Wt Readings from Last 3 Encounters:  01/05/22 183 lb (83 kg)  12/25/21 190 lb 7.6 oz (86.4 kg)  12/23/21 190 lb (86.2 kg)   Body mass index is 28.66 kg/m.    Physical Exam Constitutional:      General: He is not in acute distress.    Appearance: Normal appearance. He is not  ill-appearing.  HENT:     Head: Normocephalic and atraumatic.  Eyes:     Conjunctiva/sclera: Conjunctivae normal.  Cardiovascular:     Rate and Rhythm: Normal rate and regular rhythm.     Heart sounds: Normal heart sounds. No murmur heard. Pulmonary:     Effort: Pulmonary effort is normal. No respiratory distress.     Breath sounds: Normal breath sounds. No wheezing or rales.  Musculoskeletal:     Right lower leg: No edema.     Left lower leg: No edema.  Skin:    General: Skin is warm and dry.     Findings: No rash.  Neurological:     Mental Status: He is alert. Mental status is at baseline.        Lab Results  Component Value Date   WBC 11.7 (H) 12/27/2021   HGB 12.3 (L) 12/27/2021   HCT 37.8 (L) 12/27/2021   PLT 210 12/27/2021   GLUCOSE 108 (H) 12/27/2021   CHOL 128 08/12/2021   TRIG 101.0 08/12/2021   HDL 46.10 08/12/2021   LDLCALC 61 08/12/2021   ALT 38 12/26/2021   AST 30 12/26/2021   NA 137 12/27/2021   K 3.8 12/27/2021   CL 106 12/27/2021   CREATININE 0.86 12/27/2021   BUN 20 12/27/2021   CO2 25 12/27/2021   TSH 1.20 05/27/2017   PSA 3.21 08/08/2019   HGBA1C 6.7 (H) 08/12/2021   MICROALBUR 14.3 (H) 08/12/2021   DG Chest 2 View CLINICAL DATA:  Fever, chills, and body aches  EXAM: CHEST - 2 VIEW  COMPARISON:  Radiographs 05/22/2003  FINDINGS: Hazy opacity in the right mid lung compatible with pneumonia. No pleural effusion or pneumothorax. Normal cardiomediastinal silhouette. Aortic atherosclerotic calcification. Cervical spine fusion. No acute osseous abnormality.  IMPRESSION: Right mid lung pneumonia.  Electronically Signed   By: Placido Sou M.D.   On: 12/24/2021 22:39    Assessment & Plan:    Flu vaccine today  See Problem List for Assessment and Plan of chronic medical problems.

## 2022-01-05 ENCOUNTER — Ambulatory Visit (INDEPENDENT_AMBULATORY_CARE_PROVIDER_SITE_OTHER): Payer: Medicare HMO | Admitting: Internal Medicine

## 2022-01-05 VITALS — BP 116/78 | HR 86 | Temp 98.1°F | Ht 67.0 in | Wt 183.0 lb

## 2022-01-05 DIAGNOSIS — Z23 Encounter for immunization: Secondary | ICD-10-CM

## 2022-01-05 DIAGNOSIS — J189 Pneumonia, unspecified organism: Secondary | ICD-10-CM | POA: Diagnosis not present

## 2022-01-05 DIAGNOSIS — I10 Essential (primary) hypertension: Secondary | ICD-10-CM | POA: Diagnosis not present

## 2022-01-05 DIAGNOSIS — R69 Illness, unspecified: Secondary | ICD-10-CM | POA: Diagnosis not present

## 2022-01-05 DIAGNOSIS — F17210 Nicotine dependence, cigarettes, uncomplicated: Secondary | ICD-10-CM | POA: Diagnosis not present

## 2022-01-05 NOTE — Assessment & Plan Note (Signed)
Actively working on smoking cessation Using nicotine patch 14 mg per 24-hour and has not smoked since he left the hospital Discussed that we can do the 7 mg patch when he is ready if he wants to transition slowly, but he may just continue to use the 14 mg patch and wean from there

## 2022-01-05 NOTE — Assessment & Plan Note (Signed)
Has completed the antibiotics Has mild cough-2-3 times a day, decreased appetite and energy-all of which are improving as expected No shortness of breath, wheeze or fevers Discussed with him and his wife that I expect his symptoms to continue to improve Stressed smoking cessation-he is committed and using the nicotine patch daily He will call with any concerning symptoms or if anything worsens instead of improves We will plan on doing repeat chest x-ray in 4 weeks at his next appointment

## 2022-01-05 NOTE — Assessment & Plan Note (Signed)
Chronic Blood pressure well controlled Occasional lightheadedness when he stands-advised to monitor this and make sure he is keeping up with his fluids.  Blood pressure lower here today than usual-monitor, but no changes today Continue diltiazem 180 mg twice daily, lisinopril-hydrochlorothiazide 20-12.5 mg twice daily

## 2022-01-06 NOTE — Progress Notes (Unsigned)
Cardiology Office Note:    Date:  01/07/2022   ID:  Colin Love, DOB 15-Dec-1945, MRN [id number]  PCP:  Binnie Rail, MD   [facility] Providers Cardiologist:  Candee Furbish, MD     Referring MD: Binnie Rail, MD   Chief Complaint: follow-up atrial fibrillation  History of Present Illness:    Colin Love is a very pleasant 76 y.o. male with a hx of HTN, persistent atrial fibrillation on chronic anticoagulation, HLD, type 2 diabetes, severe biatrial enlargement on echo, and tobacco abuse.  Family history CAD, father died at age 56 from MI.   Last cardiology clinic visit was 03/28/2021 with Dr. Marlou Porch for atrial fibrillation detected by PCP. He had not seen him since 2018.  He was started on Eliquis 5 mg twice daily and advised to stop aspirin.  Rate was well controlled on diltiazem 180 mg.  He was successfully cardioverted on 04/24/2021 with second defib at 200 joules.   Admission 10/19-10/21/23, presented for cough, rigors, and fever.  Found to have RML pneumonia treated with antibiotics and advised to follow-up with PCP.  Today, he is here with his wife. Recent hospitalization for pneumonia, is feeling better and getting his energy back.  He admits that he is not very active on a consistent basis.  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  Does a lot of walking on these days. BP has been soft. Dr. Quay Burow told him she may reduce anti-hypertensives at next office visit.  He is back in atrial fibrillation today, was unaware of this. Does not feel particularly symptomatic.  He has stopped smoking. He denies chest pain, shortness of breath, lower extremity edema, fatigue, palpitations, melena, hematuria, hemoptysis, diaphoresis, weakness, presyncope, syncope, orthopnea, and PND.  Past Medical History:  Diagnosis Date   A-fib ([place])    Adenoma of left adrenal gland    At risk for sleep apnea    STOP-BANG= 4    SENT TO PCP 02-06-2014   Bladder cancer ([place])  02/06/2014   local, low grade pap uro carcinoma   BPH (benign prostatic hypertrophy)    Diverticulosis of colon    Full dentures    GERD (gastroesophageal reflux disease)    History of adenomatous polyp of colon    History of diverticulitis of colon    Hyperlipidemia    Hypertension    Murmur, cardiac    PAC (premature atrial contraction)    Type 2 diabetes mellitus [facility])     Past Surgical History:  Procedure Laterality Date   ANTERIOR CERVICAL DECOMP/DISCECTOMY FUSION  05-24-2003   C4  --  C7   CARDIOVERSION N/A 04/24/2021   Procedure: CARDIOVERSION;  Surgeon: Thayer Headings, MD;  Location: [facility];  Service: Cardiovascular;  Laterality: N/A;   COLONOSCOPY W/ POLYPECTOMY  last one 07-24-2013 w/ EGD   CYSTOSCOPY W/ RETROGRADES Bilateral 02/09/2014   Procedure: BILATERAL RETROGRADE PYELOGRAM , Cystoscopy;  Surgeon: Ardis Hughs, MD;  Location: [facility];  Service: Urology;  Laterality: Bilateral;   TRANSURETHRAL RESECTION OF BLADDER TUMOR WITH GYRUS (TURBT-GYRUS) N/A 02/09/2014   Procedure: Bladder Biopsies;  Surgeon: Ardis Hughs, MD;  Location: [facility];  Service: Urology;  Laterality: N/A;    Current Medications: Current Meds  Medication Sig   acetaminophen (TYLENOL) 500 MG tablet Take 1,000 mg by mouth every 6 (six) hours as needed for mild pain.   apixaban (ELIQUIS) 5 MG TABS tablet  TAKE 1 TABLET BY MOUTH TWICE A DAY   atorvastatin (LIPITOR) 20 MG tablet TAKE 1 TABLET BY MOUTH ONCE DAILY AT 6PM   diclofenac Sodium (VOLTAREN) 1 % GEL Apply 2 g topically daily as needed (pain).   diltiazem (CARDIZEM CD) 180 MG 24 hr capsule TAKE 1 CAPSULE BY MOUTH TWICE A DAY   gabapentin (NEURONTIN) 100 MG capsule Take 1-2 capsules (100-200 mg total) by mouth daily as needed. (Patient taking differently: Take 100-200 mg by mouth daily as needed (Pain).)   latanoprost (XALATAN) 0.005 % ophthalmic solution Place 1 drop into both eyes at bedtime.     lisinopril-hydrochlorothiazide (ZESTORETIC) 20-12.5 MG tablet TAKE 1 TABLET BY MOUTH TWICE A DAY   metFORMIN (GLUCOPHAGE) 500 MG tablet TAKE 1 TABLET BY MOUTH TWICE A DAY WITH MEALS   nicotine (NICODERM CQ - DOSED IN MG/24 HOURS) 14 mg/24hr patch Place 1 patch (14 mg total) onto the skin daily.   potassium chloride (KLOR-CON) 10 MEQ tablet TAKE 3 TABLETS BY MOUTH DAILY   tamsulosin (FLOMAX) 0.4 MG CAPS capsule Take 0.4 mg by mouth daily.   terazosin (HYTRIN) 10 MG capsule TAKE 1 CAPSULE BY MOUTH EVERYDAY AT BEDTIME     Allergies:   Patient has no known allergies.   Social History   Socioeconomic History   Marital status: Married    Spouse name: Not on file   Number of children: Not on file   Years of education: Not on file   Highest education level: Not on file  Occupational History   Not on file  Tobacco Use   Smoking status: Former    Packs/day: 0.50    Years: 40.00    Total pack years: 20.00    Types: Cigarettes    Quit date: 12/25/2021    Years since quitting: 0.0   Smokeless tobacco: Never   Tobacco comments:    Patient reports has not smoked since he went to the hospital on 12/25/21  Vaping Use   Vaping Use: Never used  Substance and Sexual Activity   Alcohol use: Yes    Comment: OCCASIONAL   Drug use: No   Sexual activity: Not Currently  Other Topics Concern   Not on file  Social History Narrative   Not on file   Social Determinants of Health   Financial Resource Strain: Low Risk  (10/10/2020)   Overall Financial Resource Strain (CARDIA)    Difficulty of Paying Living Expenses: Not hard at all  Food Insecurity: No Food Insecurity (12/31/2021)   Hunger Vital Sign    Worried About Running Out of Food in the Last Year: Never true    [place] in the Last Year: Never true  Transportation Needs: No Transportation Needs (12/31/2021)   PRAPARE - Hydrologist (Medical): No    Lack of Transportation (Non-Medical): No  Physical  Activity: Sufficiently Active (10/10/2020)   Exercise Vital Sign    Days of Exercise per Week: 1 day    Minutes of Exercise per Session: 150+ min  Stress: No Stress Concern Present (10/10/2020)   [place]    Feeling of Stress : Not at all  Social Connections: Socially Integrated (10/10/2020)   Social Connection and Isolation Panel [NHANES]    Frequency of Communication with Friends and Family: More than three times a week    Frequency of Social Gatherings with Friends and Family: More than three times a week  Attends Religious Services: More than 4 times per year    Active Member of Clubs or Organizations: Yes    Attends Archivist Meetings: More than 4 times per year    Marital Status: Married     Family History: The patient's family history includes Colon cancer in his mother; Diabetes in his brother; Hyperlipidemia in his father and mother; Hypertension in his father and mother.  ROS:   Please see the history of present illness.  All other systems reviewed and are negative.  Labs/Other Studies Reviewed:    The following studies were reviewed today:  Echo 04/14/21  1. Left ventricular ejection fraction, by estimation, is 55 to 60%. The  left ventricle has normal function. The left ventricle has no regional  wall motion abnormalities. There is moderate asymmetric left ventricular  hypertrophy of the basal-septal  segment. Left ventricular diastolic parameters are indeterminate.   2. Right ventricular systolic function is normal. The right ventricular  size is mildly enlarged. There is normal pulmonary artery systolic  pressure. The estimated right ventricular systolic pressure is 48.1 mmHg.   3. Left atrial size was severely dilated.   4. Right atrial size was severely dilated.   5. The mitral valve is normal in structure. Trivial mitral valve  regurgitation. No evidence of mitral stenosis.   6. The  aortic valve is tricuspid. Aortic valve regurgitation is not  visualized. No aortic stenosis is present.   7. The inferior vena cava is normal in size with greater than 50%  respiratory variability, suggesting right atrial pressure of 3 mmHg.   Recent Labs: 12/25/2021: Magnesium 1.7 12/26/2021: ALT 38 12/27/2021: BUN 20; Creatinine, Ser 0.86; Hemoglobin 12.3; Platelets 210; Potassium 3.8; Sodium 137  Recent Lipid Panel    Component Value Date/Time   CHOL 128 08/12/2021 1013   TRIG 101.0 08/12/2021 1013   TRIG 138 05/31/2009 0000   HDL 46.10 08/12/2021 1013   CHOLHDL 3 08/12/2021 1013   VLDL 20.2 08/12/2021 1013   LDLCALC 61 08/12/2021 1013     Risk Assessment/Calculations:    CHA2DS2-VASc Score = 4  This indicates a 4.8% annual risk of stroke. The patient's score is based upon: CHF History: 0 HTN History: 1 Diabetes History: 1 Stroke History: 0 Vascular Disease History: 0 Age Score: 2 Gender Score: 0    Physical Exam:    VS:  BP 110/74 (BP Location: Right Arm, Patient Position: Sitting, Cuff Size: Normal)   Pulse 72   Ht '5\' 7"'$  (1.702 m)   Wt 186 lb (84.4 kg)   SpO2 96%   BMI 29.13 kg/m     Wt Readings from Last 3 Encounters:  01/07/22 186 lb (84.4 kg)  01/05/22 183 lb (83 kg)  12/25/21 190 lb 7.6 oz (86.4 kg)     GEN:  Well nourished, well developed in no acute distress HEENT: Normal NECK: No JVD; No carotid bruits CARDIAC: Irregular RR, no murmurs, rubs, gallops RESPIRATORY:  Clear to auscultation without rales, wheezing or rhonchi  ABDOMEN: Soft, non-tender, non-distended MUSCULOSKELETAL:  No edema; No deformity. 2+ pedal pulses, equal bilaterally SKIN: Warm and dry NEUROLOGIC:  Alert and oriented x 3 PSYCHIATRIC:  Normal affect   EKG:  EKG is ordered today.  The ekg ordered today demonstrates atrial fibrillation at 80 bpm with occasional PVC versus aberrantly conducted complex, LAD, no ST abnormality   Diagnoses:    1. Essential hypertension    2. Persistent atrial fibrillation ([place])   3. Tobacco dependence due  to cigarettes   4. Dyslipidemia   5. Secondary hypercoagulable state ([place])    Assessment and Plan:     Atrial fibrillation on chronic anticoagulation: EKG reveals he is back in A-fib today with HR well controlled. Overall he is asymptomatic. Lengthy discussion about treatment options including antiarrhythmics, referral to A-fib clinic, repeat DCCV, and continued rate control. He would like time to consider options. Printed information given to him. Advised him to call back if he would like to pursue further treatment prior to next office visit. No bleeding problems on Eliquis and he is on appropriate dose at 5 mg twice daily. Continue Eliquis, diltiazem.  Hypertension: BP is well-controlled today. Has home monitor, but does not monitor consistently.  I have asked him to monitor occasionally for additional monitoring HR in setting of A fib. If anti-hypertensive therapy is changed, would recommend that he continue current dose of diltiazem.   Tobacco abuse: He has quit smoking and is committed to remaining smoke-free. I congratulated him on this achievement.  Dyslipidemia: LDL 61 on 08/12/2021, well controlled.  Continue atorvastatin.     Disposition: 3 months with Dr. Marlou Porch or APP  Medication Adjustments/Labs and Tests Ordered: Current medicines are reviewed at length with the patient today.  Concerns regarding medicines are outlined above.  Orders Placed This Encounter  Procedures   EKG 12-Lead   No orders of the defined types were placed in this encounter.   Patient Instructions  Medication Instructions:   Your physician recommends that you continue on your current medications as directed. Please refer to the Current Medication list given to you today.   *If you need a refill on your cardiac medications before your next appointment, please call your pharmacy*   Lab Work:  None ordered.  If you have labs  (blood work) drawn today and your tests are completely normal, you will receive your results only by: [place] (if you have MyChart) OR A paper copy in the mail If you have any lab test that is abnormal or we need to change your treatment, we will call you to review the results.   Testing/Procedures:  None ordered.   Follow-Up: At [facility], you and your health needs are our priority.  As part of our continuing mission to provide you with exceptional heart care, we have created designated Provider Care Teams.  These Care Teams include your primary Cardiologist (physician) and Advanced Practice Providers (APPs -  Physician Assistants and Nurse Practitioners) who all work together to provide you with the care you need, when you need it.  We recommend signing up for the patient portal called "MyChart".  Sign up information is provided on this After Visit Summary.  MyChart is used to connect with patients for Virtual Visits (Telemedicine).  Patients are able to view lab/test results, encounter notes, upcoming appointments, etc.  Non-urgent messages can be sent to your provider as well.   To learn more about what you can do with MyChart, go to [email or web address].    Your next appointment:   3 month(s)  The format for your next appointment:   In Person  Provider:   Candee Furbish, MD     Other Instructions  Atrial Fibrillation  Atrial fibrillation is a type of heartbeat that is irregular or fast. If you have this condition, your heart beats without any order. This makes it hard for your heart to pump blood in a normal way. Atrial fibrillation may come and go, or it may become  a long-lasting problem. If this condition is not treated, it can put you at higher risk for stroke, heart failure, and other heart problems. What are the causes? This condition may be caused by diseases that damage the heart. They include: High blood pressure. Heart failure. Heart valve  disease. Heart surgery. Other causes include: Diabetes. Thyroid disease. Being overweight. Kidney disease. Sometimes the cause is not known. What increases the risk? You are more likely to develop this condition if: You are older. You smoke. You exercise often and very hard. You have a family history of this condition. You are a man. You use drugs. You drink a lot of alcohol. You have lung conditions, such as emphysema, pneumonia, or COPD. You have sleep apnea. What are the signs or symptoms? Common symptoms of this condition include: A feeling that your heart is beating very fast. Chest pain or discomfort. Feeling short of breath. Suddenly feeling light-headed or weak. Getting tired easily during activity. Fainting. Sweating. In some cases, there are no symptoms. How is this treated? Treatment for this condition depends on underlying conditions and how you feel when you have atrial fibrillation. They include: Medicines to: Prevent blood clots. Treat heart rate or heart rhythm problems. Using devices, such as a pacemaker, to correct heart rhythm problems. Doing surgery to remove the part of the heart that sends bad signals. Closing an area where clots can form in the heart (left atrial appendage). In some cases, your doctor will treat other underlying conditions. Follow these instructions at home: Medicines Take over-the-counter and prescription medicines only as told by your doctor. Do not take any new medicines without first talking to your doctor. If you are taking blood thinners: Talk with your doctor before you take any medicines that have aspirin or NSAIDs, such as ibuprofen, in them. Take your medicine exactly as told by your doctor. Take it at the same time each day. Avoid activities that could hurt or bruise you. Follow instructions about how to prevent falls. Wear a bracelet that says you are taking blood thinners. Or, carry a card that lists what medicines you  take. Lifestyle     Do not use any products that have nicotine or tobacco in them. These include cigarettes, e-cigarettes, and chewing tobacco. If you need help quitting, ask your doctor. Eat heart-healthy foods. Talk with your doctor about the right eating plan for you. Exercise regularly as told by your doctor. Do not drink alcohol. Lose weight if you are overweight. Do not use drugs, including cannabis. General instructions If you have a condition that causes breathing to stop for a short period of time (apnea), treat it as told by your doctor. Keep a healthy weight. Do not use diet pills unless your doctor says they are safe for you. Diet pills may make heart problems worse. Keep all follow-up visits as told by your doctor. This is important. Contact a doctor if: You notice a change in the speed, rhythm, or strength of your heartbeat. You are taking a blood-thinning medicine and you get more bruising. You get tired more easily when you move or exercise. You have a sudden change in weight. Get help right away if:  You have pain in your chest or your belly (abdomen). You have trouble breathing. You have side effects of blood thinners, such as blood in your vomit, poop (stool), or pee (urine), or bleeding that cannot stop. You have any signs of a stroke. "BE FAST" is an easy way to remember the  main warning signs: B - Balance. Signs are dizziness, sudden trouble walking, or loss of balance. E - Eyes. Signs are trouble seeing or a change in how you see. F - Face. Signs are sudden weakness or loss of feeling in the face, or the face or eyelid drooping on one side. A - Arms. Signs are weakness or loss of feeling in an arm. This happens suddenly and usually on one side of the body. S - Speech. Signs are sudden trouble speaking, slurred speech, or trouble understanding what people say. T - Time. Time to call emergency services. Write down what time symptoms started. You have other signs  of a stroke, such as: A sudden, very bad headache with no known cause. Feeling like you may vomit (nausea). Vomiting. A seizure. These symptoms may be an emergency. Do not wait to see if the symptoms will go away. Get medical help right away. Call your local emergency services (911 in the [place] Do not drive yourself to the hospital. Summary Atrial fibrillation is a type of heartbeat that is irregular or fast. You are at higher risk of this condition if you smoke, are older, have diabetes, or are overweight. Follow your doctor's instructions about medicines, diet, exercise, and follow-up visits. Get help right away if you have signs or symptoms of a stroke. Get help right away if you cannot catch your breath, or you have chest pain or discomfort. This information is not intended to replace advice given to you by your health care provider. Make sure you discuss any questions you have with your health care provider. Document Revised: 08/17/2018 Document Reviewed: 08/17/2018 Elsevier Patient Education  [place]         Signed, Emmaline Life, NP  01/07/2022 9:41 AM    [place]

## 2022-01-07 ENCOUNTER — Ambulatory Visit: Payer: Medicare HMO | Attending: Nurse Practitioner | Admitting: Nurse Practitioner

## 2022-01-07 ENCOUNTER — Encounter: Payer: Self-pay | Admitting: Nurse Practitioner

## 2022-01-07 VITALS — BP 110/74 | HR 72 | Ht 67.0 in | Wt 186.0 lb

## 2022-01-07 DIAGNOSIS — I10 Essential (primary) hypertension: Secondary | ICD-10-CM | POA: Diagnosis not present

## 2022-01-07 DIAGNOSIS — I4819 Other persistent atrial fibrillation: Secondary | ICD-10-CM | POA: Diagnosis not present

## 2022-01-07 DIAGNOSIS — F17210 Nicotine dependence, cigarettes, uncomplicated: Secondary | ICD-10-CM

## 2022-01-07 DIAGNOSIS — E785 Hyperlipidemia, unspecified: Secondary | ICD-10-CM | POA: Diagnosis not present

## 2022-01-07 DIAGNOSIS — R69 Illness, unspecified: Secondary | ICD-10-CM | POA: Diagnosis not present

## 2022-01-07 DIAGNOSIS — D6869 Other thrombophilia: Secondary | ICD-10-CM

## 2022-01-07 NOTE — Patient Instructions (Signed)
Medication Instructions:   Your physician recommends that you continue on your current medications as directed. Please refer to the Current Medication list given to you today.   *If you need a refill on your cardiac medications before your next appointment, please call your pharmacy*   Lab Work:  None ordered.  If you have labs (blood work) drawn today and your tests are completely normal, you will receive your results only by: [place] (if you have MyChart) OR A paper copy in the mail If you have any lab test that is abnormal or we need to change your treatment, we will call you to review the results.   Testing/Procedures:  None ordered.   Follow-Up: At [facility], you and your health needs are our priority.  As part of our continuing mission to provide you with exceptional heart care, we have created designated Provider Care Teams.  These Care Teams include your primary Cardiologist (physician) and Advanced Practice Providers (APPs -  Physician Assistants and Nurse Practitioners) who all work together to provide you with the care you need, when you need it.  We recommend signing up for the patient portal called "MyChart".  Sign up information is provided on this After Visit Summary.  MyChart is used to connect with patients for Virtual Visits (Telemedicine).  Patients are able to view lab/test results, encounter notes, upcoming appointments, etc.  Non-urgent messages can be sent to your provider as well.   To learn more about what you can do with MyChart, go to [email or web address].    Your next appointment:   3 month(s)  The format for your next appointment:   In Person  Provider:   Candee Furbish, MD     Other Instructions  Atrial Fibrillation  Atrial fibrillation is a type of heartbeat that is irregular or fast. If you have this condition, your heart beats without any order. This makes it hard for your heart to pump blood in a normal way. Atrial  fibrillation may come and go, or it may become a long-lasting problem. If this condition is not treated, it can put you at higher risk for stroke, heart failure, and other heart problems. What are the causes? This condition may be caused by diseases that damage the heart. They include: High blood pressure. Heart failure. Heart valve disease. Heart surgery. Other causes include: Diabetes. Thyroid disease. Being overweight. Kidney disease. Sometimes the cause is not known. What increases the risk? You are more likely to develop this condition if: You are older. You smoke. You exercise often and very hard. You have a family history of this condition. You are a man. You use drugs. You drink a lot of alcohol. You have lung conditions, such as emphysema, pneumonia, or COPD. You have sleep apnea. What are the signs or symptoms? Common symptoms of this condition include: A feeling that your heart is beating very fast. Chest pain or discomfort. Feeling short of breath. Suddenly feeling light-headed or weak. Getting tired easily during activity. Fainting. Sweating. In some cases, there are no symptoms. How is this treated? Treatment for this condition depends on underlying conditions and how you feel when you have atrial fibrillation. They include: Medicines to: Prevent blood clots. Treat heart rate or heart rhythm problems. Using devices, such as a pacemaker, to correct heart rhythm problems. Doing surgery to remove the part of the heart that sends bad signals. Closing an area where clots can form in the heart (left atrial appendage). In some cases, your  doctor will treat other underlying conditions. Follow these instructions at home: Medicines Take over-the-counter and prescription medicines only as told by your doctor. Do not take any new medicines without first talking to your doctor. If you are taking blood thinners: Talk with your doctor before you take any medicines that  have aspirin or NSAIDs, such as ibuprofen, in them. Take your medicine exactly as told by your doctor. Take it at the same time each day. Avoid activities that could hurt or bruise you. Follow instructions about how to prevent falls. Wear a bracelet that says you are taking blood thinners. Or, carry a card that lists what medicines you take. Lifestyle     Do not use any products that have nicotine or tobacco in them. These include cigarettes, e-cigarettes, and chewing tobacco. If you need help quitting, ask your doctor. Eat heart-healthy foods. Talk with your doctor about the right eating plan for you. Exercise regularly as told by your doctor. Do not drink alcohol. Lose weight if you are overweight. Do not use drugs, including cannabis. General instructions If you have a condition that causes breathing to stop for a short period of time (apnea), treat it as told by your doctor. Keep a healthy weight. Do not use diet pills unless your doctor says they are safe for you. Diet pills may make heart problems worse. Keep all follow-up visits as told by your doctor. This is important. Contact a doctor if: You notice a change in the speed, rhythm, or strength of your heartbeat. You are taking a blood-thinning medicine and you get more bruising. You get tired more easily when you move or exercise. You have a sudden change in weight. Get help right away if:  You have pain in your chest or your belly (abdomen). You have trouble breathing. You have side effects of blood thinners, such as blood in your vomit, poop (stool), or pee (urine), or bleeding that cannot stop. You have any signs of a stroke. "BE FAST" is an easy way to remember the main warning signs: B - Balance. Signs are dizziness, sudden trouble walking, or loss of balance. E - Eyes. Signs are trouble seeing or a change in how you see. F - Face. Signs are sudden weakness or loss of feeling in the face, or the face or eyelid drooping on  one side. A - Arms. Signs are weakness or loss of feeling in an arm. This happens suddenly and usually on one side of the body. S - Speech. Signs are sudden trouble speaking, slurred speech, or trouble understanding what people say. T - Time. Time to call emergency services. Write down what time symptoms started. You have other signs of a stroke, such as: A sudden, very bad headache with no known cause. Feeling like you may vomit (nausea). Vomiting. A seizure. These symptoms may be an emergency. Do not wait to see if the symptoms will go away. Get medical help right away. Call your local emergency services (911 in the [place] Do not drive yourself to the hospital. Summary Atrial fibrillation is a type of heartbeat that is irregular or fast. You are at higher risk of this condition if you smoke, are older, have diabetes, or are overweight. Follow your doctor's instructions about medicines, diet, exercise, and follow-up visits. Get help right away if you have signs or symptoms of a stroke. Get help right away if you cannot catch your breath, or you have chest pain or discomfort. This information is not intended to  replace advice given to you by your health care provider. Make sure you discuss any questions you have with your health care provider. Document Revised: 08/17/2018 Document Reviewed: 08/17/2018 Elsevier Patient Education  Colin Love

## 2022-02-02 ENCOUNTER — Other Ambulatory Visit (HOSPITAL_COMMUNITY): Payer: Self-pay

## 2022-02-02 DIAGNOSIS — R972 Elevated prostate specific antigen [PSA]: Secondary | ICD-10-CM | POA: Diagnosis not present

## 2022-02-08 ENCOUNTER — Encounter: Payer: Self-pay | Admitting: Internal Medicine

## 2022-02-08 NOTE — Patient Instructions (Addendum)
      Blood work was ordered.  Have a chest xray today.     Medications changes include :   None     Return in about 6 months (around 08/14/2022) for Physical Exam.

## 2022-02-08 NOTE — Progress Notes (Signed)
 "     Subjective:    Patient ID: Colin Love, male    DOB: [birth year], 76 y.o.   MRN: [id number]     HPI Colin Love is here for follow up of his chronic medical problems, including DM, htn, afib, hld, burning sensation in feet, spinal stenosis w/ chronic back pain (has gotten injections)  Having right lower back pain - it is fairly constant.  No pain with sitting or laying.  The goes down to his hip and leg.  The injections did not help.  He has seen Dr. Joane in the past.  He has had an MRI.  He does take the gabapentin  on occasion.  Medications and allergies reviewed with patient and updated if appropriate.  Current Outpatient Medications on File Prior to Visit  Medication Sig Dispense Refill   acetaminophen  (TYLENOL ) 500 MG tablet Take 1,000 mg by mouth every 6 (six) hours as needed for mild pain.     apixaban  (ELIQUIS ) 5 MG TABS tablet TAKE 1 TABLET BY MOUTH TWICE A DAY 60 tablet 5   atorvastatin  (LIPITOR) 20 MG tablet TAKE 1 TABLET BY MOUTH ONCE DAILY AT 6PM 90 tablet 3   diclofenac Sodium (VOLTAREN) 1 % GEL Apply 2 g topically daily as needed (pain).     diltiazem  (CARDIZEM  CD) 180 MG 24 hr capsule TAKE 1 CAPSULE BY MOUTH TWICE A DAY 180 capsule 3   gabapentin  (NEURONTIN ) 100 MG capsule Take 1-2 capsules (100-200 mg total) by mouth daily as needed. (Patient taking differently: Take 100-200 mg by mouth daily as needed (Pain).) 90 capsule 3   latanoprost  (XALATAN ) 0.005 % ophthalmic solution Place 1 drop into both eyes at bedtime.      lisinopril -hydrochlorothiazide  (ZESTORETIC ) 20-12.5 MG tablet TAKE 1 TABLET BY MOUTH TWICE A DAY 180 tablet 3   metFORMIN  (GLUCOPHAGE ) 500 MG tablet TAKE 1 TABLET BY MOUTH TWICE A DAY WITH MEALS 180 tablet 1   potassium chloride  (KLOR-CON ) 10 MEQ tablet TAKE 3 TABLETS BY MOUTH DAILY 270 tablet 1   tamsulosin  (FLOMAX ) 0.4 MG CAPS capsule Take 0.4 mg by mouth daily.     terazosin  (HYTRIN ) 10 MG capsule TAKE 1 CAPSULE BY MOUTH EVERYDAY AT BEDTIME 90  capsule 1   No current facility-administered medications on file prior to visit.     Review of Systems  Constitutional:  Negative for chills and fever.  Respiratory:  Positive for cough (occ cough) and shortness of breath (not really - but does not breath like he used to). Negative for wheezing.   Cardiovascular:  Negative for chest pain, palpitations and leg swelling.  Neurological:  Positive for light-headedness. Negative for dizziness and headaches.       Objective:   Vitals:   02/12/22 0930  BP: 122/72  Pulse: 88  Temp: 98.3 F (36.8 C)  SpO2: 98%   BP Readings from Last 3 Encounters:  02/12/22 122/72  01/07/22 110/74  01/05/22 116/78   Wt Readings from Last 3 Encounters:  02/12/22 192 lb (87.1 kg)  01/07/22 186 lb (84.4 kg)  01/05/22 183 lb (83 kg)   Body mass index is 30.07 kg/m.    Physical Exam Constitutional:      General: He is not in acute distress.    Appearance: Normal appearance. He is not ill-appearing.  HENT:     Head: Normocephalic and atraumatic.  Eyes:     Conjunctiva/sclera: Conjunctivae normal.  Cardiovascular:     Rate and Rhythm: Normal rate and regular rhythm.  Heart sounds: Normal heart sounds. No murmur heard. Pulmonary:     Effort: Pulmonary effort is normal. No respiratory distress.     Breath sounds: Normal breath sounds. No wheezing or rales.  Musculoskeletal:     Right lower leg: No edema.     Left lower leg: No edema.  Skin:    General: Skin is warm and dry.     Findings: No rash.  Neurological:     Mental Status: He is alert. Mental status is at baseline.  Psychiatric:        Mood and Affect: Mood normal.        Lab Results  Component Value Date   WBC 11.7 (H) 12/27/2021   HGB 12.3 (L) 12/27/2021   HCT 37.8 (L) 12/27/2021   PLT 210 12/27/2021   GLUCOSE 108 (H) 12/27/2021   CHOL 128 08/12/2021   TRIG 101.0 08/12/2021   HDL 46.10 08/12/2021   LDLCALC 61 08/12/2021   ALT 38 12/26/2021   AST 30 12/26/2021    NA 137 12/27/2021   K 3.8 12/27/2021   CL 106 12/27/2021   CREATININE 0.86 12/27/2021   BUN 20 12/27/2021   CO2 25 12/27/2021   TSH 1.20 05/27/2017   PSA 3.21 08/08/2019   HGBA1C 6.7 (H) 08/12/2021   MICROALBUR 14.3 (H) 08/12/2021     Assessment & Plan:    See Problem List for Assessment and Plan of chronic medical problems.    "

## 2022-02-12 ENCOUNTER — Ambulatory Visit (INDEPENDENT_AMBULATORY_CARE_PROVIDER_SITE_OTHER): Payer: Medicare HMO | Admitting: Internal Medicine

## 2022-02-12 ENCOUNTER — Ambulatory Visit (INDEPENDENT_AMBULATORY_CARE_PROVIDER_SITE_OTHER): Payer: Medicare HMO

## 2022-02-12 VITALS — BP 122/72 | HR 88 | Temp 98.3°F | Ht 67.0 in | Wt 192.0 lb

## 2022-02-12 DIAGNOSIS — J189 Pneumonia, unspecified organism: Secondary | ICD-10-CM

## 2022-02-12 DIAGNOSIS — I10 Essential (primary) hypertension: Secondary | ICD-10-CM | POA: Diagnosis not present

## 2022-02-12 DIAGNOSIS — E785 Hyperlipidemia, unspecified: Secondary | ICD-10-CM | POA: Diagnosis not present

## 2022-02-12 DIAGNOSIS — E118 Type 2 diabetes mellitus with unspecified complications: Secondary | ICD-10-CM

## 2022-02-12 DIAGNOSIS — M48061 Spinal stenosis, lumbar region without neurogenic claudication: Secondary | ICD-10-CM | POA: Diagnosis not present

## 2022-02-12 DIAGNOSIS — R208 Other disturbances of skin sensation: Secondary | ICD-10-CM | POA: Diagnosis not present

## 2022-02-12 DIAGNOSIS — I4819 Other persistent atrial fibrillation: Secondary | ICD-10-CM | POA: Diagnosis not present

## 2022-02-12 LAB — LIPID PANEL
Cholesterol: 133 mg/dL (ref 0–200)
HDL: 48.7 mg/dL
LDL Cholesterol: 65 mg/dL (ref 0–99)
NonHDL: 83.85
Total CHOL/HDL Ratio: 3
Triglycerides: 92 mg/dL (ref 0.0–149.0)
VLDL: 18.4 mg/dL (ref 0.0–40.0)

## 2022-02-12 LAB — CBC WITH DIFFERENTIAL/PLATELET
Basophils Absolute: 0 10*3/uL (ref 0.0–0.1)
Basophils Relative: 0.3 % (ref 0.0–3.0)
Eosinophils Absolute: 0.1 10*3/uL (ref 0.0–0.7)
Eosinophils Relative: 1.7 % (ref 0.0–5.0)
HCT: 43.3 % (ref 39.0–52.0)
Hemoglobin: 14.3 g/dL (ref 13.0–17.0)
Lymphocytes Relative: 13.3 % (ref 12.0–46.0)
Lymphs Abs: 1 10*3/uL (ref 0.7–4.0)
MCHC: 33.1 g/dL (ref 30.0–36.0)
MCV: 86.6 fl (ref 78.0–100.0)
Monocytes Absolute: 0.7 10*3/uL (ref 0.1–1.0)
Monocytes Relative: 9.4 % (ref 3.0–12.0)
Neutro Abs: 5.5 10*3/uL (ref 1.4–7.7)
Neutrophils Relative %: 75.3 % (ref 43.0–77.0)
Platelets: 191 10*3/uL (ref 150.0–400.0)
RBC: 5 Mil/uL (ref 4.22–5.81)
RDW: 14.8 % (ref 11.5–15.5)
WBC: 7.2 10*3/uL (ref 4.0–10.5)

## 2022-02-12 LAB — COMPREHENSIVE METABOLIC PANEL WITH GFR
ALT: 14 U/L (ref 0–53)
AST: 16 U/L (ref 0–37)
Albumin: 4.3 g/dL (ref 3.5–5.2)
Alkaline Phosphatase: 60 U/L (ref 39–117)
BUN: 15 mg/dL (ref 6–23)
CO2: 29 meq/L (ref 19–32)
Calcium: 10.5 mg/dL (ref 8.4–10.5)
Chloride: 103 meq/L (ref 96–112)
Creatinine, Ser: 0.97 mg/dL (ref 0.40–1.50)
GFR: 75.48 mL/min
Glucose, Bld: 116 mg/dL — ABNORMAL HIGH (ref 70–99)
Potassium: 3.7 meq/L (ref 3.5–5.1)
Sodium: 139 meq/L (ref 135–145)
Total Bilirubin: 0.9 mg/dL (ref 0.2–1.2)
Total Protein: 7 g/dL (ref 6.0–8.3)

## 2022-02-12 LAB — HEMOGLOBIN A1C: Hgb A1c MFr Bld: 6.7 % — ABNORMAL HIGH (ref 4.6–6.5)

## 2022-02-12 NOTE — Assessment & Plan Note (Addendum)
Chronic Blood pressure well controlled CMP Continue lisinopril-hydrochlorothiazide 20-12.5 mg twice daily, diltiazem 180 mg twice daily

## 2022-02-12 NOTE — Assessment & Plan Note (Signed)
Chronic Probable neuropathy Continue gabapentin 100-200 mg daily as needed

## 2022-02-12 NOTE — Assessment & Plan Note (Addendum)
Chronic with neurogenic claudication Limited in what he is able to do Had injections but that did not seem to help much Advised follow-up with Dr. Georgina Snell to see other options Deferred physical therapy Continue gabapentin daily as needed

## 2022-02-12 NOTE — Assessment & Plan Note (Signed)
Chronic   Lab Results  Component Value Date   HGBA1C 6.7 (H) 08/12/2021   Sugars daily Controlled Check A1c Continue metformin 500 mg twice daily Stressed regular exercise, diabetic diet'

## 2022-02-12 NOTE — Assessment & Plan Note (Signed)
Recent pneumonia of right middle lung Will get chest x-ray now to ensure complete resolution Has occasional cough He has quit smoking

## 2022-02-12 NOTE — Assessment & Plan Note (Signed)
Chronic °Regular exercise and healthy diet encouraged °Check lipid panel  °Continue atorvastatin 20 mg daily °

## 2022-02-12 NOTE — Assessment & Plan Note (Signed)
Chronic Follows with cardiology On Eliquis 5 mg twice daily, diltiazem 180 mg twice daily

## 2022-03-12 DIAGNOSIS — H401131 Primary open-angle glaucoma, bilateral, mild stage: Secondary | ICD-10-CM | POA: Diagnosis not present

## 2022-03-27 ENCOUNTER — Other Ambulatory Visit: Payer: Self-pay | Admitting: Internal Medicine

## 2022-04-09 ENCOUNTER — Ambulatory Visit: Payer: Medicare HMO | Admitting: Cardiology

## 2022-04-13 ENCOUNTER — Ambulatory Visit: Payer: Medicare HMO | Attending: Cardiology | Admitting: Cardiology

## 2022-04-13 ENCOUNTER — Encounter: Payer: Self-pay | Admitting: Cardiology

## 2022-04-13 VITALS — BP 136/82 | HR 67 | Ht 67.0 in | Wt 193.2 lb

## 2022-04-13 DIAGNOSIS — I10 Essential (primary) hypertension: Secondary | ICD-10-CM

## 2022-04-13 DIAGNOSIS — I4819 Other persistent atrial fibrillation: Secondary | ICD-10-CM | POA: Diagnosis not present

## 2022-04-13 NOTE — Progress Notes (Signed)
Cardiology Office Note:    Date:  04/14/2022   ID:  Colin Love, DOB Oct 27, 1945, MRN [id number]  PCP:  Binnie Rail, MD   [facility] Providers Cardiologist:  Candee Furbish, MD     Referring MD: Binnie Rail, MD    History of Present Illness:    Colin Love is a 77 y.o. male here for the follow up of atrial fibrillation. Sees Dr. Billey Gosling.  In review of clinic note from 02/11/2021 from Dr. Quay Burow, EKG demonstrated atrial fibrillation at 70 bpm with left axis deviation and T wave abnormality compared to the prior EKG from 2018.  Other comorbidities include diabetes BPH chronic neuropathy in relation to diabetes, hyperlipidemia on atorvastatin 20 mg and hypertension on ACE inhibitor hydrochlorothiazide as well as Hytrin.   Overall he is doing quite well denies any chest pain, has mild shortness of breath no syncope no bleeding issues.  No diabetes no strokes.  Eliquis 5 mg twice daily and advised to stop aspirin.  Rate was well controlled on diltiazem 180 mg.  He was successfully cardioverted on 04/24/2021 with second defib at 200 joules. However he went back into AFIB, asymptomatic.   Balance seems to be an issue, also mild SOB. Quit tob, weight went up.  Past Medical History:  Diagnosis Date   A-fib ([place])    Adenoma of left adrenal gland    At risk for sleep apnea    STOP-BANG= 4    SENT TO PCP 02-06-2014   Bladder cancer (Early) 02/06/2014   local, low grade pap uro carcinoma   BPH (benign prostatic hypertrophy)    Diverticulosis of colon    Full dentures    GERD (gastroesophageal reflux disease)    History of adenomatous polyp of colon    History of diverticulitis of colon    Hyperlipidemia    Hypertension    Murmur, cardiac    PAC (premature atrial contraction)    Type 2 diabetes mellitus [facility])     Past Surgical History:  Procedure Laterality Date   ANTERIOR CERVICAL DECOMP/DISCECTOMY FUSION  05-24-2003   C4  --  C7   CARDIOVERSION N/A 04/24/2021    Procedure: CARDIOVERSION;  Surgeon: Thayer Headings, MD;  Location: [facility];  Service: Cardiovascular;  Laterality: N/A;   COLONOSCOPY W/ POLYPECTOMY  last one 07-24-2013 w/ EGD   CYSTOSCOPY W/ RETROGRADES Bilateral 02/09/2014   Procedure: BILATERAL RETROGRADE PYELOGRAM , Cystoscopy;  Surgeon: Ardis Hughs, MD;  Location: [facility];  Service: Urology;  Laterality: Bilateral;   TRANSURETHRAL RESECTION OF BLADDER TUMOR WITH GYRUS (TURBT-GYRUS) N/A 02/09/2014   Procedure: Bladder Biopsies;  Surgeon: Ardis Hughs, MD;  Location: [facility];  Service: Urology;  Laterality: N/A;    Current Medications: Current Meds  Medication Sig   apixaban (ELIQUIS) 5 MG TABS tablet TAKE 1 TABLET BY MOUTH TWICE A DAY   atorvastatin (LIPITOR) 20 MG tablet TAKE 1 TABLET BY MOUTH ONCE DAILY AT 6PM   diclofenac Sodium (VOLTAREN) 1 % GEL Apply 2 g topically daily as needed (pain).   diltiazem (CARDIZEM CD) 180 MG 24 hr capsule TAKE 1 CAPSULE BY MOUTH TWICE A DAY   gabapentin (NEURONTIN) 100 MG capsule Take 1-2 capsules (100-200 mg total) by mouth daily as needed. (Patient taking differently: Take 100-200 mg by mouth daily as needed (Pain).)   latanoprost (XALATAN) 0.005 % ophthalmic solution Place 1 drop into both eyes at bedtime.    lisinopril-hydrochlorothiazide (ZESTORETIC)  20-12.5 MG tablet TAKE 1 TABLET BY MOUTH TWICE A DAY   metFORMIN (GLUCOPHAGE) 500 MG tablet TAKE 1 TABLET BY MOUTH TWICE A DAY WITH MEALS   potassium chloride (KLOR-CON) 10 MEQ tablet TAKE 3 TABLETS BY MOUTH DAILY   tamsulosin (FLOMAX) 0.4 MG CAPS capsule Take 0.4 mg by mouth daily.   terazosin (HYTRIN) 10 MG capsule TAKE 1 CAPSULE BY MOUTH EVERYDAY AT BEDTIME     Allergies:   Patient has no known allergies.   Social History   Socioeconomic History   Marital status: Married    Spouse name: Not on file   Number of children: Not on file   Years of education: Not on file   Highest education  level: Not on file  Occupational History   Not on file  Tobacco Use   Smoking status: Former    Packs/day: 0.50    Years: 40.00    Total pack years: 20.00    Types: Cigarettes    Quit date: 12/25/2021    Years since quitting: 0.3   Smokeless tobacco: Never   Tobacco comments:    Patient reports has not smoked since he went to the hospital on 12/25/21  Vaping Use   Vaping Use: Never used  Substance and Sexual Activity   Alcohol use: Yes    Comment: OCCASIONAL   Drug use: No   Sexual activity: Not Currently  Other Topics Concern   Not on file  Social History Narrative   Not on file   Social Determinants of Health   Financial Resource Strain: Low Risk  (10/10/2020)   Overall Financial Resource Strain (CARDIA)    Difficulty of Paying Living Expenses: Not hard at all  Food Insecurity: No Food Insecurity (12/31/2021)   Hunger Vital Sign    Worried About Running Out of Food in the Last Year: Never true    [place] in the Last Year: Never true  Transportation Needs: No Transportation Needs (12/31/2021)   PRAPARE - Hydrologist (Medical): No    Lack of Transportation (Non-Medical): No  Physical Activity: Sufficiently Active (10/10/2020)   Exercise Vital Sign    Days of Exercise per Week: 1 day    Minutes of Exercise per Session: 150+ min  Stress: No Stress Concern Present (10/10/2020)   [place]    Feeling of Stress : Not at all  Social Connections: Socially Integrated (10/10/2020)   Social Connection and Isolation Panel [NHANES]    Frequency of Communication with Friends and Family: More than three times a week    Frequency of Social Gatherings with Friends and Family: More than three times a week    Attends Religious Services: More than 4 times per year    Active Member of [employer] or Organizations: Yes    Attends Music therapist: More than 4 times per year     Marital Status: Married     Family History: The patient's family history includes Colon cancer in his mother; Diabetes in his brother; Hyperlipidemia in his father and mother; Hypertension in his father and mother.  ROS:   Please see the history of present illness.    No chest pain fevers chills nausea vomiting syncope bleeding all other systems reviewed and are negative.  EKGs/Labs/Other Studies Reviewed:    The following studies were reviewed today: Prior echocardiogram 2023 mild left atrial enlargement normal EF.  EKG:  Prior atrial fibrillation heart  rate 72 bpm -No significant change from prior EKG.  Personally reviewed.  Recent Labs: 12/25/2021: Magnesium 1.7 02/12/2022: ALT 14; BUN 15; Creatinine, Ser 0.97; Hemoglobin 14.3; Platelets 191.0; Potassium 3.7; Sodium 139  Recent Lipid Panel    Component Value Date/Time   CHOL 133 02/12/2022 1014   TRIG 92.0 02/12/2022 1014   TRIG 138 05/31/2009 0000   HDL 48.70 02/12/2022 1014   CHOLHDL 3 02/12/2022 1014   VLDL 18.4 02/12/2022 1014   LDLCALC 65 02/12/2022 1014     Risk Assessment/Calculations:    CHA2DS2-VASc Score =     This indicates a  % annual risk of stroke. The patient's score is based upon:     Echo 04/14/21   1. Left ventricular ejection fraction, by estimation, is 55 to 60%. The  left ventricle has normal function. The left ventricle has no regional  wall motion abnormalities. There is moderate asymmetric left ventricular  hypertrophy of the basal-septal  segment. Left ventricular diastolic parameters are indeterminate.   2. Right ventricular systolic function is normal. The right ventricular  size is mildly enlarged. There is normal pulmonary artery systolic  pressure. The estimated right ventricular systolic pressure is 67.2 mmHg.   3. Left atrial size was severely dilated.   4. Right atrial size was severely dilated.   5. The mitral valve is normal in structure. Trivial mitral valve  regurgitation. No  evidence of mitral stenosis.   6. The aortic valve is tricuspid. Aortic valve regurgitation is not  visualized. No aortic stenosis is present.   7. The inferior vena cava is normal in size with greater than 50%  respiratory variability, suggesting right atrial pressure of 3 mmHg.               Physical Exam:    VS:  BP 136/82   Pulse 67   Ht '5\' 7"'$  (1.702 m)   Wt 193 lb 3.2 oz (87.6 kg)   SpO2 97%   BMI 30.26 kg/m     Wt Readings from Last 3 Encounters:  04/13/22 193 lb 3.2 oz (87.6 kg)  02/12/22 192 lb (87.1 kg)  01/07/22 186 lb (84.4 kg)     GEN:  Well nourished, well developed in no acute distress HEENT: Normal NECK: No JVD; No carotid bruits LYMPHATICS: No lymphadenopathy CARDIAC: Irregularly irregular, no murmurs, no rubs, gallops RESPIRATORY:  Clear to auscultation without rales, wheezing or rhonchi  ABDOMEN: Soft, non-tender, non-distended MUSCULOSKELETAL:  No edema; No deformity  SKIN: Warm and dry NEUROLOGIC:  Alert and oriented x 3 PSYCHIATRIC:  Normal affect   No changes today and physical exam  ASSESSMENT:    1. Persistent atrial fibrillation ([place])   2. Essential hypertension     PLAN:    In order of problems listed above:   Persistent atrial fibrillation (HCC) Atrial fibrillation detected by Dr. Quay Burow previously in January 2023.  At that time we started Eliquis 5 mg twice a day.  Stopped aspirin 81 mg.  We attempted cardioversion which was successful in February 2023 however shortly thereafter he reverted back to atrial fibrillation.  Because of this, we will maintain rate control of his atrial fibrillation.    Prior left atrial size mildly enlarged with normal EF.  His rate control is perfect at this point.  He is on diltiazem 180.  No changes made.  Tobacco dependence due to cigarettes Stopped 12/2021 after [facility] hospitalization.  Excellent job.  He has gained a few pounds.  His wife goes  to the [place] and walks.  Encouraged him to do the same.     Essential hypertension Overall well controlled.  Multidrug regimen noted.  No changes made.  Well-controlled.  Dyslipidemia On atorvastatin 20 mg a day.  Prior LDL 65.  Excellent.  No myalgias.      Medication Adjustments/Labs and Tests Ordered: Current medicines are reviewed at length with the patient today.  Concerns regarding medicines are outlined above.  No orders of the defined types were placed in this encounter.  No orders of the defined types were placed in this encounter.   Patient Instructions  Medication Instructions:  Your physician recommends that you continue on your current medications as directed. Please refer to the Current Medication list given to you today.  *If you need a refill on your cardiac medications before your next appointment, please call your pharmacy*   Lab Work: None.  If you have labs (blood work) drawn today and your tests are completely normal, you will receive your results only by: [place] (if you have MyChart) OR A paper copy in the mail If you have any lab test that is abnormal or we need to change your treatment, we will call you to review the results.   Testing/Procedures: None.   Follow-Up: At [facility], you and your health needs are our priority.  As part of our continuing mission to provide you with exceptional heart care, we have created designated Provider Care Teams.  These Care Teams include your primary Cardiologist (physician) and Advanced Practice Providers (APPs -  Physician Assistants and Nurse Practitioners) who all work together to provide you with the care you need, when you need it.  We recommend signing up for the patient portal called "MyChart".  Sign up information is provided on this After Visit Summary.  MyChart is used to connect with patients for Virtual Visits (Telemedicine).  Patients are able to view lab/test results, encounter notes, upcoming appointments, etc.  Non-urgent messages can  be sent to your provider as well.   To learn more about what you can do with MyChart, go to [email or web address].    Your next appointment:   1 year(s)  Provider:   Candee Furbish, MD        Signed, Candee Furbish, MD  04/14/2022 6:49 AM    Index

## 2022-04-13 NOTE — Patient Instructions (Signed)
Medication Instructions:  Your physician recommends that you continue on your current medications as directed. Please refer to the Current Medication list given to you today.  *If you need a refill on your cardiac medications before your next appointment, please call your pharmacy*   Lab Work: None.  If you have labs (blood work) drawn today and your tests are completely normal, you will receive your results only by: MyChart Message (if you have MyChart) OR A paper copy in the mail If you have any lab test that is abnormal or we need to change your treatment, we will call you to review the results.   Testing/Procedures: None.   Follow-Up: At [facility], you and your health needs are our priority.  As part of our continuing mission to provide you with exceptional heart care, we have created designated Provider Care Teams.  These Care Teams include your primary Cardiologist (physician) and Advanced Practice Providers (APPs -  Physician Assistants and Nurse Practitioners) who all work together to provide you with the care you need, when you need it.  We recommend signing up for the patient portal called "MyChart".  Sign up information is provided on this After Visit Summary.  MyChart is used to connect with patients for Virtual Visits (Telemedicine).  Patients are able to view lab/test results, encounter notes, upcoming appointments, etc.  Non-urgent messages can be sent to your provider as well.   To learn more about what you can do with MyChart, go to [email or web address].    Your next appointment:   1 year(s)  Provider:   Mark Skains, MD     

## 2022-05-05 DIAGNOSIS — R972 Elevated prostate specific antigen [PSA]: Secondary | ICD-10-CM | POA: Diagnosis not present

## 2022-05-11 DIAGNOSIS — C672 Malignant neoplasm of lateral wall of bladder: Secondary | ICD-10-CM | POA: Diagnosis not present

## 2022-05-14 ENCOUNTER — Other Ambulatory Visit: Payer: Self-pay | Admitting: Urology

## 2022-05-14 DIAGNOSIS — R972 Elevated prostate specific antigen [PSA]: Secondary | ICD-10-CM

## 2022-06-05 ENCOUNTER — Other Ambulatory Visit: Payer: Self-pay | Admitting: Cardiology

## 2022-06-05 ENCOUNTER — Other Ambulatory Visit: Payer: Self-pay | Admitting: Internal Medicine

## 2022-06-05 ENCOUNTER — Encounter: Payer: Self-pay | Admitting: Urology

## 2022-06-05 DIAGNOSIS — I4819 Other persistent atrial fibrillation: Secondary | ICD-10-CM

## 2022-06-05 NOTE — Telephone Encounter (Signed)
Prescription refill request for Eliquis received.  Indication:afib  Last office visit: Skains, 04/13/2022 Scr: 0.97, 02/12/2022 Age: 77 yo  Weight: 87.6 kg   Refill sent.

## 2022-06-08 ENCOUNTER — Ambulatory Visit
Admission: RE | Admit: 2022-06-08 | Discharge: 2022-06-08 | Disposition: A | Discharge status: Home / Self Care | Payer: Medicare HMO | Source: Ambulatory Visit | Attending: Urology | Admitting: Urology

## 2022-06-08 DIAGNOSIS — R972 Elevated prostate specific antigen [PSA]: Secondary | ICD-10-CM

## 2022-06-08 MED ORDER — GADOPICLENOL 0.5 MMOL/ML IV SOLN
8.0000 mL | Freq: Once | INTRAVENOUS | Status: AC | PRN
  Administered 2022-06-08: 8 mL via INTRAVENOUS

## 2022-06-12 ENCOUNTER — Other Ambulatory Visit: Payer: Self-pay | Admitting: Internal Medicine

## 2022-06-15 DIAGNOSIS — R972 Elevated prostate specific antigen [PSA]: Secondary | ICD-10-CM | POA: Diagnosis not present

## 2022-06-16 ENCOUNTER — Telehealth: Payer: Self-pay | Admitting: Internal Medicine

## 2022-06-16 ENCOUNTER — Other Ambulatory Visit: Payer: Self-pay

## 2022-06-16 ENCOUNTER — Telehealth: Payer: Self-pay | Admitting: *Deleted

## 2022-06-16 MED ORDER — METFORMIN HCL 500 MG PO TABS: ORAL_TABLET | ORAL | 1 refills | Status: DC

## 2022-06-16 NOTE — Telephone Encounter (Signed)
Patient called and said he is due for a refill of his Metformin. However, it appears to have been removed from his medication list. He has not stopped taking it and would like to know if it can still be refilled. Best callback is [phone].

## 2022-06-16 NOTE — Telephone Encounter (Signed)
   Pre-operative Risk Assessment    Patient Name: Colin Love  DOB: 1945-05-26 MRN: [id number]      Request for Surgical Clearance    Procedure:   MRI FUSION BIOPSY  Date of Surgery:  Clearance 07/27/2022                                 Surgeon:  DR. Berniece Salines Surgeon's Group or Practice Name:  [facility] Phone number:  [phone] Fax number:  [phone]   Type of Clearance Requested:   - Pharmacy:  Hold Apixaban (Eliquis) X'S 3 DAYS   Type of Anesthesia:  Not Indicated   Additional requests/questions:    [name]   06/16/2022, 1:50 PM

## 2022-06-16 NOTE — Telephone Encounter (Signed)
Sent in today 

## 2022-06-17 NOTE — Telephone Encounter (Signed)
Dr. Anne Fu,   You saw this patient on 04/13/2022. Will you please comment on clearance for for MRI Fusion Biopsy?  Please route your response to P CV DIV Preop. I will communicate with requesting office once you have given recommendations.   Thank you!  Carlos Levering, NP

## 2022-06-17 NOTE — Telephone Encounter (Signed)
Patient with diagnosis of afib on Eliquis for anticoagulation.    Procedure: MRI fusion biopsy Date of procedure: 07/27/22  CHA2DS2-VASc Score = 4  This indicates a 4.8% annual risk of stroke. The patient's score is based upon: CHF History: 0 HTN History: 1 Diabetes History: 1 Stroke History: 0 Vascular Disease History: 0 Age Score: 2 Gender Score: 0  CrCl 74mL/min using adj body weight Platelet count 191K  Per office protocol, patient can hold Eliquis for 3 days prior to procedure.    **This guidance is not considered finalized until pre-operative APP has relayed final recommendations.**

## 2022-06-18 NOTE — Telephone Encounter (Signed)
   Name: Colin Love  DOB: 1945/10/07  MRN: [id number]   Primary Cardiologist: Donato Schultz, MD  Chart reviewed as part of pre-operative protocol coverage. Colin Love was last seen on 04/13/2022 by Dr. Anne Fu.  At that time he is doing well. Per Dr. Anne Fu "okay to hold Eliquis prior to procedure."  Therefore, based on ACC/AHA guidelines, the patient would be at acceptable risk for the planned procedure without further cardiovascular testing.   Per Pharm D: Patient with diagnosis of afib on Eliquis for anticoagulation.     Procedure: MRI fusion biopsy Date of procedure: 07/27/22   CHA2DS2-VASc Score = 4  This indicates a 4.8% annual risk of stroke. The patient's score is based upon: CHF History: 0 HTN History: 1 Diabetes History: 1 Stroke History: 0 Vascular Disease History: 0 Age Score: 2 Gender Score: 0   CrCl 15mL/min using adj body weight Platelet count 191K   Per office protocol, patient can hold Eliquis for 3 days prior to procedure.  I will route this recommendation to the requesting party via Epic fax function and remove from pre-op pool. Please call with questions.  Carlos Levering, NP 06/18/2022, 8:37 AM

## 2022-07-01 ENCOUNTER — Other Ambulatory Visit: Payer: Self-pay | Admitting: Internal Medicine

## 2022-07-08 DIAGNOSIS — C61 Malignant neoplasm of prostate: Secondary | ICD-10-CM

## 2022-07-08 HISTORY — DX: Malignant neoplasm of prostate: C61

## 2022-07-16 ENCOUNTER — Telehealth: Payer: Self-pay

## 2022-07-16 NOTE — Telephone Encounter (Signed)
Called patient to schedule Medicare Annual Wellness Visit (AWV). Left message for patient to call back and schedule Medicare Annual Wellness Visit (AWV).  Last date of AWV: 10/13/21  Please schedule an appointment at any time On Annual Wellness Visit Schedule.

## 2022-07-17 ENCOUNTER — Other Ambulatory Visit: Payer: Self-pay | Admitting: Internal Medicine

## 2022-07-20 ENCOUNTER — Telehealth: Payer: Self-pay | Admitting: Internal Medicine

## 2022-07-20 NOTE — Telephone Encounter (Signed)
Contacted Colin Love to schedule their annual wellness visit. Appointment made for 08/04/2022.  [facility] Care Guide [facility] AWV TEAM Direct Dial: [phone]

## 2022-07-22 ENCOUNTER — Other Ambulatory Visit: Payer: Self-pay | Admitting: Internal Medicine

## 2022-07-27 DIAGNOSIS — C61 Malignant neoplasm of prostate: Secondary | ICD-10-CM | POA: Diagnosis not present

## 2022-07-27 HISTORY — PX: PROSTATE BIOPSY: SHX241

## 2022-08-04 ENCOUNTER — Ambulatory Visit (INDEPENDENT_AMBULATORY_CARE_PROVIDER_SITE_OTHER): Payer: Medicare HMO

## 2022-08-04 VITALS — Wt 193.0 lb

## 2022-08-04 DIAGNOSIS — Z87891 Personal history of nicotine dependence: Secondary | ICD-10-CM

## 2022-08-04 DIAGNOSIS — Z0000 Encounter for general adult medical examination without abnormal findings: Secondary | ICD-10-CM

## 2022-08-04 NOTE — Patient Instructions (Signed)
Mr. Colin Love , Thank you for taking time to come for your Medicare Wellness Visit. I appreciate your ongoing commitment to your health goals. Please review the following plan we discussed and let me know if I can assist you in the future.   These are the goals we discussed:  Goals      Patient Stated     Increase my physical activity by starting to walk twice a week.      Quit smoking / using tobacco     Smoking;  Educated to avoid secondary smoke Smoking cessation in : [employer] [facility] at [phone] -day  Classes offered a couple of times a month;  Will work with the patient as far as registration and location  Meds may help; chatix (Varenicline); Zyban (Bupropion SR); Nicotine Replacement (gum; lozenges; patches; etc.)   30 pack yr smoking hx: Educated regarding LDCT; To discuss with MD at next fup. Also educated on AAA screening for men 65-75 who have smoked         This is a list of the screening recommended for you and due dates:  Health Maintenance  Topic Date Due   Screening for Lung Cancer  Never done   Complete foot exam   08/04/2019   Eye exam for diabetics  08/01/2021   Yearly kidney health urinalysis for diabetes  08/13/2022   Hemoglobin A1C  08/14/2022   Flu Shot  10/08/2022   Yearly kidney function blood test for diabetes  02/13/2023   Medicare Annual Wellness Visit  08/04/2023   DTaP/Tdap/Td vaccine (3 - Td or Tdap) 02/04/2032   Pneumonia Vaccine  Completed   Hepatitis C Screening  Completed   Zoster (Shingles) Vaccine  Completed   HPV Vaccine  Aged Out   Colon Cancer Screening  Discontinued   COVID-19 Vaccine  Discontinued    Advanced directives: Please bring a copy of your health care power of attorney and living will to the office to be added to your chart at your convenience.   Conditions/risks identified: Chronic Knee Pain, Adult Chronic knee pain is pain in one or both knees that lasts longer than 3 months. Symptoms of chronic knee pain may  include swelling, stiffness, and discomfort. Age-related wear and tear (osteoarthritis) of the knee joint is the most common cause of chronic knee pain. Other possible causes include: A long-term immune-related disease that causes inflammation of the knee (rheumatoid arthritis). This usually affects both knees. Inflammatory arthritis, such as gout or pseudogout. An injury to the knee that causes arthritis. An injury to the knee that damages the ligaments. Ligaments are strong tissues that connect bones to each other. Runner's knee or pain behind the kneecap. Treatment for chronic knee pain depends on the cause. The main treatments for chronic knee pain are physical therapy and weight loss. This condition may also be treated with medicines, injections, a knee sleeve or brace, and by using crutches. Rest, ice, pressure (compression), and elevation, also known as RICE therapy, may also be recommended. Follow these instructions at home: If you have a knee sleeve or brace:  Wear the knee sleeve or brace as told by your health care provider. Remove it only as told by your health care provider. Loosen it if your toes tingle, become numb, or turn cold and blue. Keep it clean. If the sleeve or brace is not waterproof: Do not let it get wet. Remove it if allowed by your health care provider, or cover it with a watertight covering when you  take a bath or a shower. Managing pain, stiffness, and swelling     If directed, apply heat to the affected area as often as told by your health care provider. Use the heat source that your health care provider recommends, such as a moist heat pack or a heating pad. If you have a removable knee sleeve or brace, remove it as told by your health care provider. Place a towel between your skin and the heat source. Leave the heat on for 20-30 minutes. Remove the heat if your skin turns bright red. This is especially important if you are unable to feel pain, heat, or cold.  You may have a greater risk of getting burned. If directed, put ice on the affected area. To do this: If you have a removable knee sleeve or brace, remove it as told by your health care provider. Put ice in a plastic bag. Place a towel between your skin and the bag. Leave the ice on for 20 minutes, 2-3 times a day. Remove the ice if your skin turns bright red. This is very important. If you cannot feel pain, heat, or cold, you have a greater risk of damage to the area. Move your toes often to reduce stiffness and swelling. Raise (elevate) the injured area above the level of your heart while you are sitting or lying down. Activity Avoid high-impact activities or exercises, such as running, jumping rope, or doing jumping jacks. Follow the exercise plan that your health care provider designed for you. Your health care provider may suggest that you: Avoid activities that make knee pain worse. This may require you to change your exercise routines, sport participation, or job duties. Wear shoes with cushioned soles. Avoid sports that require running and sudden changes in direction. Do physical therapy. Physical therapy is planned to match your needs and abilities. It may include exercises for strength, flexibility, stability, and endurance. Do exercises that increase balance and strength, such as tai chi and yoga. Do not use the injured limb to support your body weight until your health care provider says that you can. Use crutches as told by your health care provider. Return to your normal activities as told by your health care provider. Ask your health care provider what activities are safe for you. General instructions Take over-the-counter and prescription medicines only as told by your health care provider. Lose weight if you are overweight. Losing even a little weight can reduce knee pain. Ask your health care provider what your ideal weight is, and how to safely lose extra weight. A dietitian  may be able to help you plan your meals. Do not use any products that contain nicotine or tobacco, such as cigarettes, e-cigarettes, and chewing tobacco. These can delay healing. If you need help quitting, ask your health care provider. Keep all follow-up visits. This is important. Contact a health care provider if: You have knee pain that is not getting better or gets worse. You are unable to do your physical therapy exercises due to knee pain. Get help right away if: Your knee swells and the swelling becomes worse. You cannot move your knee. You have severe knee pain. Summary Knee pain that lasts more than 3 months is considered chronic knee pain. The main treatments for chronic knee pain are physical therapy and weight loss. You may also need to take medicines, wear a knee sleeve or brace, use crutches, and apply ice or heat. Losing even a little weight can reduce knee pain. Ask  your health care provider what your ideal weight is, and how to safely lose extra weight. A dietitian may be able to help you plan your meals. Follow the exercise plan that your health care provider designed for you. This information is not intended to replace advice given to you by your health care provider. Make sure you discuss any questions you have with your health care provider. Document Revised: 08/08/2019 Document Reviewed: 08/09/2019 Elsevier Patient Education  2024 [employer].   Next appointment: Follow up in one year for your annual wellness visit. 08/05/23  Preventive Care 65 Years and Older, Male  Preventive care refers to lifestyle choices and visits with your health care provider that can promote health and wellness. What does preventive care include? A yearly physical exam. This is also called an annual well check. Dental exams once or twice a year. Routine eye exams. Ask your health care provider how often you should have your eyes checked. Personal lifestyle choices, including: Daily care  of your teeth and gums. Regular physical activity. Eating a healthy diet. Avoiding tobacco and drug use. Limiting alcohol use. Practicing safe sex. Taking low doses of aspirin every day. Taking vitamin and mineral supplements as recommended by your health care provider. What happens during an annual well check? The services and screenings done by your health care provider during your annual well check will depend on your age, overall health, lifestyle risk factors, and family history of disease. Counseling  Your health care provider may ask you questions about your: Alcohol use. Tobacco use. Drug use. Emotional well-being. Home and relationship well-being. Sexual activity. Eating habits. History of falls. Memory and ability to understand (cognition). Work and work Astronomer. Screening  You may have the following tests or measurements: Height, weight, and BMI. Blood pressure. Lipid and cholesterol levels. These may be checked every 5 years, or more frequently if you are over 59 years old. Skin check. Lung cancer screening. You may have this screening every year starting at age 52 if you have a 30-pack-year history of smoking and currently smoke or have quit within the past 15 years. Fecal occult blood test (FOBT) of the stool. You may have this test every year starting at age 75. Flexible sigmoidoscopy or colonoscopy. You may have a sigmoidoscopy every 5 years or a colonoscopy every 10 years starting at age 52. Prostate cancer screening. Recommendations will vary depending on your family history and other risks. Hepatitis C blood test. Hepatitis B blood test. Sexually transmitted disease (STD) testing. Diabetes screening. This is done by checking your blood sugar (glucose) after you have not eaten for a while (fasting). You may have this done every 1-3 years. Abdominal aortic aneurysm (AAA) screening. You may need this if you are a current or former smoker. Osteoporosis. You may  be screened starting at age 18 if you are at high risk. Talk with your health care provider about your test results, treatment options, and if necessary, the need for more tests. Vaccines  Your health care provider may recommend certain vaccines, such as: Influenza vaccine. This is recommended every year. Tetanus, diphtheria, and acellular pertussis (Tdap, Td) vaccine. You may need a Td booster every 10 years. Zoster vaccine. You may need this after age 30. Pneumococcal 13-valent conjugate (PCV13) vaccine. One dose is recommended after age 14. Pneumococcal polysaccharide (PPSV23) vaccine. One dose is recommended after age 32. Talk to your health care provider about which screenings and vaccines you need and how often you need them. This  information is not intended to replace advice given to you by your health care provider. Make sure you discuss any questions you have with your health care provider. Document Released: 03/22/2015 Document Revised: 11/13/2015 Document Reviewed: 12/25/2014 Elsevier Interactive Patient Education  2017 [employer].  Fall Prevention in the Home Falls can cause injuries. They can happen to people of all ages. There are many things you can do to make your home safe and to help prevent falls. What can I do on the outside of my home? Regularly fix the edges of walkways and driveways and fix any cracks. Remove anything that might make you trip as you walk through a door, such as a raised step or threshold. Trim any bushes or trees on the path to your home. Use bright outdoor lighting. Clear any walking paths of anything that might make someone trip, such as rocks or tools. Regularly check to see if handrails are loose or broken. Make sure that both sides of any steps have handrails. Any raised decks and porches should have guardrails on the edges. Have any leaves, snow, or ice cleared regularly. Use sand or salt on walking paths during winter. Clean up any spills in  your garage right away. This includes oil or grease spills. What can I do in the bathroom? Use night lights. Install grab bars by the toilet and in the tub and shower. Do not use towel bars as grab bars. Use non-skid mats or decals in the tub or shower. If you need to sit down in the shower, use a plastic, non-slip stool. Keep the floor dry. Clean up any water that spills on the floor as soon as it happens. Remove soap buildup in the tub or shower regularly. Attach bath mats securely with double-sided non-slip rug tape. Do not have throw rugs and other things on the floor that can make you trip. What can I do in the bedroom? Use night lights. Make sure that you have a light by your bed that is easy to reach. Do not use any sheets or blankets that are too big for your bed. They should not hang down onto the floor. Have a firm chair that has side arms. You can use this for support while you get dressed. Do not have throw rugs and other things on the floor that can make you trip. What can I do in the kitchen? Clean up any spills right away. Avoid walking on wet floors. Keep items that you use a lot in easy-to-reach places. If you need to reach something above you, use a strong step stool that has a grab bar. Keep electrical cords out of the way. Do not use floor polish or wax that makes floors slippery. If you must use wax, use non-skid floor wax. Do not have throw rugs and other things on the floor that can make you trip. What can I do with my stairs? Do not leave any items on the stairs. Make sure that there are handrails on both sides of the stairs and use them. Fix handrails that are broken or loose. Make sure that handrails are as long as the stairways. Check any carpeting to make sure that it is firmly attached to the stairs. Fix any carpet that is loose or worn. Avoid having throw rugs at the top or bottom of the stairs. If you do have throw rugs, attach them to the floor with carpet  tape. Make sure that you have a light switch at the top  of the stairs and the bottom of the stairs. If you do not have them, ask someone to add them for you. What else can I do to help prevent falls? Wear shoes that: Do not have high heels. Have rubber bottoms. Are comfortable and fit you well. Are closed at the toe. Do not wear sandals. If you use a stepladder: Make sure that it is fully opened. Do not climb a closed stepladder. Make sure that both sides of the stepladder are locked into place. Ask someone to hold it for you, if possible. Clearly mark and make sure that you can see: Any grab bars or handrails. First and last steps. Where the edge of each step is. Use tools that help you move around (mobility aids) if they are needed. These include: Canes. Walkers. Scooters. Crutches. Turn on the lights when you go into a dark area. Replace any light bulbs as soon as they burn out. Set up your furniture so you have a clear path. Avoid moving your furniture around. If any of your floors are uneven, fix them. If there are any pets around you, be aware of where they are. Review your medicines with your doctor. Some medicines can make you feel dizzy. This can increase your chance of falling. Ask your doctor what other things that you can do to help prevent falls. This information is not intended to replace advice given to you by your health care provider. Make sure you discuss any questions you have with your health care provider. Document Released: 12/20/2008 Document Revised: 08/01/2015 Document Reviewed: 03/30/2014 Elsevier Interactive Patient Education  2017 [employer].

## 2022-08-04 NOTE — Progress Notes (Signed)
Subjective:   Colin Love is a 77 y.o. male who presents for Medicare Annual/Subsequent preventive examination.  Review of Systems   I connected with  Colin Love on 08/04/22 by a audio enabled telemedicine application and verified that I am speaking with the correct person using two identifiers.  Patient Location: Home  Provider Location: Home Office  I discussed the limitations of evaluation and management by telemedicine. The patient expressed understanding and agreed to proceed.  Cardiac Risk Factors include: advanced age (>28men, >51 women)     Objective:    Today's Vitals   08/04/22 1016  Weight: 193 lb (87.5 kg)  PainSc: 4    Body mass index is 30.23 kg/m.     08/04/2022   10:24 AM 12/25/2021    4:45 AM 10/13/2021    3:50 PM 04/24/2021    8:43 AM 10/10/2020   11:10 AM 03/16/2019   10:17 AM 05/27/2017   10:15 AM  Advanced Directives  Does Patient Have a Medical Advance Directive? Yes  Yes No Yes Yes Yes  Type of Estate agent of [place];Living will  Healthcare Power of [place];Living will  Living will;Healthcare Power of Attorney Living will Healthcare Power of [place];Living will  Does patient want to make changes to medical advance directive?   No - Patient declined  No - Patient declined No - Patient declined   Copy of Healthcare Power of Attorney in Chart? No - copy requested  No - copy requested  No - copy requested  No - copy requested  Would patient like information on creating a medical advance directive?    No - Patient declined  No - Patient declined      Information is confidential and restricted. Go to Review Flowsheets to unlock data.    Current Medications (verified) Outpatient Encounter Medications as of 08/04/2022  Medication Sig   acetaminophen (TYLENOL) 500 MG tablet Take 1,000 mg by mouth every 6 (six) hours as needed for mild pain.   atorvastatin (LIPITOR) 20 MG tablet TAKE 1 TABLET BY MOUTH ONCE DAILY AT 6PM    diclofenac Sodium (VOLTAREN) 1 % GEL Apply 2 g topically daily as needed (pain).   diltiazem (CARDIZEM CD) 180 MG 24 hr capsule TAKE 1 CAPSULE BY MOUTH TWICE A DAY   ELIQUIS 5 MG TABS tablet TAKE 1 TABLET BY MOUTH TWICE A DAY   gabapentin (NEURONTIN) 100 MG capsule Take 1-2 capsules (100-200 mg total) by mouth daily as needed. (Patient taking differently: Take 100-200 mg by mouth daily as needed (Pain).)   latanoprost (XALATAN) 0.005 % ophthalmic solution Place 1 drop into both eyes at bedtime.    lisinopril-hydrochlorothiazide (ZESTORETIC) 20-12.5 MG tablet TAKE 1 TABLET BY MOUTH TWICE A DAY   metFORMIN (GLUCOPHAGE) 500 MG tablet TAKE 1 TABLET BY MOUTH TWICE A DAY WITH MEALS   potassium chloride (KLOR-CON) 10 MEQ tablet Take 3 tablets (30 mEq total) by mouth daily. Annual appt due in June must see provider for future refills   tamsulosin (FLOMAX) 0.4 MG CAPS capsule Take 0.4 mg by mouth daily.   terazosin (HYTRIN) 10 MG capsule TAKE 1 CAPSULE BY MOUTH EVERYDAY AT BEDTIME   No facility-administered encounter medications on file as of 08/04/2022.    Allergies (verified) Patient has no known allergies.   History: Past Medical History:  Diagnosis Date   A-fib (HCC)    Adenoma of left adrenal gland    At risk for sleep apnea    STOP-BANG= 4  SENT TO PCP 02-06-2014   Bladder cancer (HCC) 02/06/2014   local, low grade pap uro carcinoma   BPH (benign prostatic hypertrophy)    Diverticulosis of colon    Full dentures    GERD (gastroesophageal reflux disease)    History of adenomatous polyp of colon    History of diverticulitis of colon    Hyperlipidemia    Hypertension    Murmur, cardiac    PAC (premature atrial contraction)    Type 2 diabetes mellitus (HCC)    Past Surgical History:  Procedure Laterality Date   ANTERIOR CERVICAL DECOMP/DISCECTOMY FUSION  05/24/2003   C4  --  C7   CARDIOVERSION N/A 04/24/2021   Procedure: CARDIOVERSION;  Surgeon: Vesta Mixer, MD;  Location:  [facility] ENDOSCOPY;  Service: Cardiovascular;  Laterality: N/A;   COLONOSCOPY W/ POLYPECTOMY  last one 07-24-2013 w/ EGD   CYSTOSCOPY W/ RETROGRADES Bilateral 02/09/2014   Procedure: BILATERAL RETROGRADE PYELOGRAM , Cystoscopy;  Surgeon: Crist Fat, MD;  Location: [facility];  Service: Urology;  Laterality: Bilateral;   PROSTATE BIOPSY  07/27/2022   TRANSURETHRAL RESECTION OF BLADDER TUMOR WITH GYRUS (TURBT-GYRUS) N/A 02/09/2014   Procedure: Bladder Biopsies;  Surgeon: Crist Fat, MD;  Location[name];  Service: Urology;  Laterality: N/A;   Family History  Problem Relation Age of Onset   Colon cancer Mother    Hypertension Mother    Hyperlipidemia Mother    Hypertension Father    Hyperlipidemia Father    Diabetes Brother    Social History   Socioeconomic History   Marital status: Married    Spouse name: Not on file   Number of children: Not on file   Years of education: Not on file   Highest education level: Not on file  Occupational History   Not on file  Tobacco Use   Smoking status: Former    Packs/day: 0.50    Years: 40.00    Additional pack years: 0.00    Total pack years: 20.00    Types: Cigarettes    Quit date: 12/25/2021    Years since quitting: 0.6   Smokeless tobacco: Never   Tobacco comments:    Patient reports has not smoked since he went to the hospital on 12/25/21  Vaping Use   Vaping Use: Never used  Substance and Sexual Activity   Alcohol use: Yes    Comment: OCCASIONAL   Drug use: No   Sexual activity: Not Currently  Other Topics Concern   Not on file  Social History Narrative   Not on file   Social Determinants of Health   Financial Resource Strain: Low Risk  (08/04/2022)   Overall Financial Resource Strain (CARDIA)    Difficulty of Paying Living Expenses: Not hard at all  Food Insecurity: No Food Insecurity (08/04/2022)   Hunger Vital Sign    Worried About Running Out of Food in the Last Year:  Never true    Ran Out of Food in the Last Year: Never true  Transportation Needs: No Transportation Needs (08/04/2022)   PRAPARE - Administrator, Civil Service (Medical): No    Lack of Transportation (Non-Medical): No  Physical Activity: Sufficiently Active (08/04/2022)   Exercise Vital Sign    Days of Exercise per Week: 7 days    Minutes of Exercise per Session: 30 min  Stress: No Stress Concern Present (08/04/2022)   [employer] of Occupational Health - Occupational Stress Questionnaire  Feeling of Stress : Not at all  Social Connections: Socially Integrated (08/04/2022)   Social Connection and Isolation Panel [NHANES]    Frequency of Communication with Friends and Family: More than three times a week    Frequency of Social Gatherings with Friends and Family: More than three times a week    Attends Religious Services: More than 4 times per year    Active Member of [employer] or Organizations: Yes    Attends Engineer, structural: More than 4 times per year    Marital Status: Married    Tobacco Counseling Counseling given: Yes Tobacco comments: Patient reports has not smoked since he went to the hospital on 12/25/21   Clinical Intake:  Pre-visit preparation completed: Yes  Pain : 0-10 Pain Score: 4  Pain Type: Chronic pain Pain Location: Back Pain Orientation: Lower Pain Descriptors / Indicators: Aching Pain Onset: 1 to 4 weeks ago     BMI - recorded: 30.23 Nutritional Status: BMI > 30  Obese Nutritional Risks: None Diabetes: No  How often do you need to have someone help you when you read instructions, pamphlets, or other written materials from your doctor or pharmacy?: 1 - Never  Diabetic?No  Interpreter Needed?: No  Information entered by :: Fredirick Maudlin   Activities of Daily Living    08/04/2022   10:25 AM 12/25/2021    4:45 AM  In your present state of health, do you have any difficulty performing the following activities:   Hearing? 1   Vision? 0   Difficulty concentrating or making decisions? 0   Walking or climbing stairs? 0   Dressing or bathing? 0   Doing errands, shopping? 0   Preparing Food and eating ? N   Using the Toilet? N   In the past six months, have you accidently leaked urine? N   Do you have problems with loss of bowel control? N   Managing your Medications? N   Managing your Finances? N   Housekeeping or managing your Housekeeping? N      Information is confidential and restricted. Go to Review Flowsheets to unlock data.    Patient Care Team: Pincus Sanes, MD as PCP - General (Internal Medicine) Jake Bathe, MD as PCP - Cardiology (Cardiology) Charna Elizabeth, MD as Consulting Physician (Gastroenterology) Crist Fat, MD (Urology) Jake Bathe, MD (Cardiology) Kathyrn Sheriff, [facility] as Pharmacist (Pharmacist) Antony Contras, MD as Consulting Physician (Ophthalmology)  Indicate any recent Medical Services you may have received from other than [facility] providers in the past year (date may be approximate).     Assessment:   This is a routine wellness examination for Colin Love.  Hearing/Vision screen Hearing Screening - Comments:: Some hearing issues no hearing aides  Vision Screening - Comments:: Wears rx glasses - up to date with routine eye exams with  Dr Randon Goldsmith   Dietary issues and exercise activities discussed: Current Exercise Habits: The patient has a physically strenuous job, but has no regular exercise apart from work., Type of exercise: walking (patient walks alot on job), Time (Minutes): 40, Frequency (Times/Week): 6, Weekly Exercise (Minutes/Week): 240, Exercise limited by: orthopedic condition(s) (knee pain /  back pain)   Goals Addressed             This Visit's Progress    Patient Stated   On track    Increase my physical activity by starting to walk twice a week.       Depression Screen  08/04/2022   10:19 AM 02/12/2022    9:35 AM 12/31/2021    11:05 AM 10/13/2021    3:46 PM 10/10/2020   11:08 AM 02/07/2020    9:59 AM 02/07/2019    9:23 AM  PHQ 2/9 Scores  PHQ - 2 Score 0 0 0 0 0 0 0  PHQ- 9 Score  0  0     Exception Documentation    Patient refusal       Fall Risk    08/04/2022   10:25 AM 02/12/2022    9:35 AM 10/13/2021    3:46 PM 10/10/2020   11:11 AM 02/07/2020    9:59 AM  Fall Risk   Falls in the past year? 0 0 0 0 0  Number falls in past yr: 0 0 0 0 0  Injury with Fall? 0 0 1 0 0  Risk for fall due to : No Fall Risks No Fall Risks No Fall Risks No Fall Risks No Fall Risks  Follow up Falls prevention discussed;Falls evaluation completed Falls evaluation completed Education provided Falls evaluation completed Falls evaluation completed    FALL RISK PREVENTION PERTAINING TO THE HOME:  Any stairs in or around the home? Yes  If so, are there any without handrails? No  Home free of loose throw rugs in walkways, pet beds, electrical cords, etc? No  Adequate lighting in your home to reduce risk of falls? Yes   ASSISTIVE DEVICES UTILIZED TO PREVENT FALLS:  Life alert? No  Use of a cane, walker or w/c? No  Grab bars in the bathroom? Yes  Shower chair or bench in shower? No  Elevated toilet seat or a handicapped toilet? No   TIMED UP AND GO:  Was the test performed?  No televist  .   Cognitive Function:    05/27/2017   10:29 AM  MMSE - Mini Mental State Exam  Orientation to time 5  Orientation to Place 5  Registration 3  Attention/ Calculation 5  Recall 3  Language- name 2 objects 2  Language- repeat 1  Language- follow 3 step command 3  Language- read & follow direction 1  Write a sentence 1  Copy design 1  Total score 30        08/04/2022   10:20 AM  6CIT Screen  What Year? 0 points  What month? 0 points  What time? 0 points  Count back from 20 0 points  Months in reverse 2 points  Repeat phrase 0 points  Total Score 2 points    Immunizations Immunization History  Administered Date(s)  Administered   Fluad Quad(high Dose 65+) 02/07/2019, 02/07/2020, 02/11/2021, 01/05/2022   Influenza Split 03/20/2011   Influenza, High Dose Seasonal PF 01/30/2013, 01/09/2014, 11/13/2014, 11/26/2015, 11/26/2016, 11/29/2017   Influenza, Seasonal, Injecte, Preservative Fre 03/17/2012   PFIZER(Purple Top)SARS-COV-2 Vaccination 04/20/2019, 05/15/2019, 02/06/2020   Pneumococcal Conjugate-13 11/13/2014   Pneumococcal Polysaccharide-23 03/20/2011   Respiratory Syncytial Virus Vaccine,Recomb Aduvanted(Arexvy) 02/19/2022   Tdap 07/02/2010, 02/03/2022   Zoster Recombinat (Shingrix) 01/13/2022, 07/21/2022    TDAP status: Up to date  Flu Vaccine status: Up to date  Pneumococcal vaccine status: Up to date  Covid-19 vaccine status: Completed vaccines  Qualifies for Shingles Vaccine? Yes   Zostavax completed Yes   Shingrix Completed?: Yes  Screening Tests Health Maintenance  Topic Date Due   Lung Cancer Screening  Never done   FOOT EXAM  08/04/2019   OPHTHALMOLOGY EXAM  08/01/2021   Diabetic kidney evaluation - Urine  ACR  08/13/2022   HEMOGLOBIN A1C  08/14/2022   INFLUENZA VACCINE  10/08/2022   Diabetic kidney evaluation - eGFR measurement  02/13/2023   Medicare Annual Wellness (AWV)  08/04/2023   DTaP/Tdap/Td (3 - Td or Tdap) 02/04/2032   Pneumonia Vaccine 4+ Years old  Completed   Hepatitis C Screening  Completed   Zoster Vaccines- Shingrix  Completed   HPV VACCINES  Aged Out   Colonoscopy  Discontinued   COVID-19 Vaccine  Discontinued    Health Maintenance  Health Maintenance Due  Topic Date Due   Lung Cancer Screening  Never done   FOOT EXAM  08/04/2019   OPHTHALMOLOGY EXAM  08/01/2021   Diabetic kidney evaluation - Urine ACR  08/13/2022    Colorectal cancer screening: Type of screening: Colonoscopy. Completed 11/21/18. Repeat every 3 years  Lung Cancer Screening: (Low Dose CT Chest recommended if Age 63-80 years, 30 pack-year currently smoking OR have quit w/in  15years.) does qualify.   Lung Cancer Screening Referral: Yes  Additional Screening:  Hepatitis C Screening: does qualify; Completed 11/14/2014  Vision Screening: Recommended annual ophthalmology exams for early detection of glaucoma and other disorders of the eye. Is the patient up to date with their annual eye exam?  Yes  Who is the provider or what is the name of the office in which the patient attends annual eye exams? Dr Randon Goldsmith If pt is not established with a provider, would they like to be referred to a provider to establish care? No .   Dental Screening: Recommended annual dental exams for proper oral hygiene  Community Resource Referral / Chronic Care Management: CRR required this visit?  No   CCM required this visit?  No      Plan:     I have personally reviewed and noted the following in the patient's chart:   Medical and social history Use of alcohol, tobacco or illicit drugs  Current medications and supplements including opioid prescriptions. Patient is not currently taking opioid prescriptions. Functional ability and status Nutritional status Physical activity Advanced directives List of other physicians Hospitalizations, surgeries, and ER visits in previous 12 months Vitals Screenings to include cognitive, depression, and falls Referrals and appointments  In addition, I have reviewed and discussed with patient certain preventive protocols, quality metrics, and best practice recommendations. A written personalized care plan for preventive services as well as general preventive health recommendations were provided to patient.     Annabell Sabal, CMA   08/04/2022   Nurse Notes: None

## 2022-08-06 DIAGNOSIS — C61 Malignant neoplasm of prostate: Secondary | ICD-10-CM | POA: Diagnosis not present

## 2022-08-11 DIAGNOSIS — Z008 Encounter for other general examination: Secondary | ICD-10-CM | POA: Diagnosis not present

## 2022-08-11 DIAGNOSIS — I10 Essential (primary) hypertension: Secondary | ICD-10-CM | POA: Diagnosis not present

## 2022-08-11 DIAGNOSIS — H409 Unspecified glaucoma: Secondary | ICD-10-CM | POA: Diagnosis not present

## 2022-08-11 DIAGNOSIS — G3184 Mild cognitive impairment, so stated: Secondary | ICD-10-CM | POA: Diagnosis not present

## 2022-08-11 DIAGNOSIS — E785 Hyperlipidemia, unspecified: Secondary | ICD-10-CM | POA: Diagnosis not present

## 2022-08-11 DIAGNOSIS — M1990 Unspecified osteoarthritis, unspecified site: Secondary | ICD-10-CM | POA: Diagnosis not present

## 2022-08-11 DIAGNOSIS — I2510 Atherosclerotic heart disease of native coronary artery without angina pectoris: Secondary | ICD-10-CM | POA: Diagnosis not present

## 2022-08-11 DIAGNOSIS — M545 Low back pain, unspecified: Secondary | ICD-10-CM | POA: Diagnosis not present

## 2022-08-11 DIAGNOSIS — N400 Enlarged prostate without lower urinary tract symptoms: Secondary | ICD-10-CM | POA: Diagnosis not present

## 2022-08-11 DIAGNOSIS — I4891 Unspecified atrial fibrillation: Secondary | ICD-10-CM | POA: Diagnosis not present

## 2022-08-11 DIAGNOSIS — E669 Obesity, unspecified: Secondary | ICD-10-CM | POA: Diagnosis not present

## 2022-08-11 DIAGNOSIS — R32 Unspecified urinary incontinence: Secondary | ICD-10-CM | POA: Diagnosis not present

## 2022-08-11 DIAGNOSIS — E876 Hypokalemia: Secondary | ICD-10-CM | POA: Diagnosis not present

## 2022-08-14 ENCOUNTER — Other Ambulatory Visit (HOSPITAL_COMMUNITY): Payer: Self-pay | Admitting: Urology

## 2022-08-14 DIAGNOSIS — C61 Malignant neoplasm of prostate: Secondary | ICD-10-CM

## 2022-08-17 NOTE — Progress Notes (Signed)
 "     Subjective:    Patient ID: Colin Love, male    DOB: Dec 22, 1945, 77 y.o.   MRN: [id number]     HPI [name] is here for follow up of his chronic medical problems.  Numbness in right arm - constant.  Worse with laying down or certain head positions.  Ho spinal surgery.  No neck pain.  No arm pain.  No weakness.  Not exercising regularly.  Diagnosed with prostate cancer.  Scheduled for PET scan and then will discuss treatment the end of the month.  Medications and allergies reviewed with patient and updated if appropriate.  Current Outpatient Medications on File Prior to Visit  Medication Sig Dispense Refill   acetaminophen  (TYLENOL ) 500 MG tablet Take 1,000 mg by mouth every 6 (six) hours as needed for mild pain.     atorvastatin  (LIPITOR) 20 MG tablet TAKE 1 TABLET BY MOUTH ONCE DAILY AT 6PM 90 tablet 3   diclofenac Sodium (VOLTAREN) 1 % GEL Apply 2 g topically daily as needed (pain).     diltiazem  (CARDIZEM  CD) 180 MG 24 hr capsule TAKE 1 CAPSULE BY MOUTH TWICE A DAY 180 capsule 3   ELIQUIS  5 MG TABS tablet TAKE 1 TABLET BY MOUTH TWICE A DAY 60 tablet 5   gabapentin  (NEURONTIN ) 100 MG capsule Take 1-2 capsules (100-200 mg total) by mouth daily as needed. (Patient taking differently: Take 100-200 mg by mouth daily as needed (Pain).) 90 capsule 3   latanoprost  (XALATAN ) 0.005 % ophthalmic solution Place 1 drop into both eyes at bedtime.      lisinopril -hydrochlorothiazide  (ZESTORETIC ) 20-12.5 MG tablet TAKE 1 TABLET BY MOUTH TWICE A DAY 180 tablet 3   metFORMIN  (GLUCOPHAGE ) 500 MG tablet TAKE 1 TABLET BY MOUTH TWICE A DAY WITH MEALS 180 tablet 1   potassium chloride  (KLOR-CON ) 10 MEQ tablet Take 3 tablets (30 mEq total) by mouth daily. Annual appt due in June must see provider for future refills 270 tablet 0   tamsulosin  (FLOMAX ) 0.4 MG CAPS capsule Take 0.4 mg by mouth daily.     terazosin  (HYTRIN ) 10 MG capsule TAKE 1 CAPSULE BY MOUTH EVERYDAY AT BEDTIME 90 capsule 1   No  current facility-administered medications on file prior to visit.     Review of Systems  Constitutional:  Negative for fever.  Respiratory:  Positive for shortness of breath (gets winded easier). Negative for cough and wheezing.   Cardiovascular:  Negative for chest pain, palpitations and leg swelling.  Neurological:  Negative for light-headedness and headaches.       Objective:   Vitals:   08/18/22 0905  BP: 120/72  Pulse: 70  Temp: 98.6 F (37 C)  SpO2: 98%   BP Readings from Last 3 Encounters:  08/18/22 120/72  04/13/22 136/82  02/12/22 122/72   Wt Readings from Last 3 Encounters:  08/18/22 200 lb (90.7 kg)  08/04/22 193 lb (87.5 kg)  04/13/22 193 lb 3.2 oz (87.6 kg)   Body mass index is 31.32 kg/m.    Physical Exam Constitutional:      General: He is not in acute distress.    Appearance: Normal appearance. He is not ill-appearing.  HENT:     Head: Normocephalic and atraumatic.  Eyes:     Conjunctiva/sclera: Conjunctivae normal.  Cardiovascular:     Rate and Rhythm: Normal rate and regular rhythm.     Heart sounds: Normal heart sounds.  Pulmonary:     Effort: Pulmonary effort is normal.  No respiratory distress.     Breath sounds: Normal breath sounds. No wheezing or rales.  Musculoskeletal:     Right lower leg: No edema.     Left lower leg: No edema.  Skin:    General: Skin is warm and dry.     Findings: No rash.  Neurological:     Mental Status: He is alert. Mental status is at baseline.  Psychiatric:        Mood and Affect: Mood normal.        Lab Results  Component Value Date   WBC 7.2 02/12/2022   HGB 14.3 02/12/2022   HCT 43.3 02/12/2022   PLT 191.0 02/12/2022   GLUCOSE 116 (H) 02/12/2022   CHOL 133 02/12/2022   TRIG 92.0 02/12/2022   HDL 48.70 02/12/2022   LDLCALC 65 02/12/2022   ALT 14 02/12/2022   AST 16 02/12/2022   NA 139 02/12/2022   K 3.7 02/12/2022   CL 103 02/12/2022   CREATININE 0.97 02/12/2022   BUN 15 02/12/2022    CO2 29 02/12/2022   TSH 1.20 05/27/2017   PSA 3.21 08/08/2019   HGBA1C 6.7 (H) 02/12/2022   MICROALBUR 14.3 (H) 08/12/2021     Assessment & Plan:    See Problem List for Assessment and Plan of chronic medical problems.    "

## 2022-08-17 NOTE — Patient Instructions (Addendum)
    Dr Marikay Alar [facility] [address] [place], [address] [place], [place] [address] [phone]     Blood work was ordered.   The lab is on the first floor.    Medications changes include :   None     Return in about 6 months (around 02/17/2023) for Physical Exam.

## 2022-08-18 ENCOUNTER — Encounter: Payer: Self-pay | Admitting: Internal Medicine

## 2022-08-18 ENCOUNTER — Ambulatory Visit (INDEPENDENT_AMBULATORY_CARE_PROVIDER_SITE_OTHER): Payer: Medicare HMO | Admitting: Internal Medicine

## 2022-08-18 VITALS — BP 120/72 | HR 70 | Temp 98.6°F | Ht 67.0 in | Wt 200.0 lb

## 2022-08-18 DIAGNOSIS — E876 Hypokalemia: Secondary | ICD-10-CM

## 2022-08-18 DIAGNOSIS — N400 Enlarged prostate without lower urinary tract symptoms: Secondary | ICD-10-CM

## 2022-08-18 DIAGNOSIS — C61 Malignant neoplasm of prostate: Secondary | ICD-10-CM | POA: Diagnosis not present

## 2022-08-18 DIAGNOSIS — E118 Type 2 diabetes mellitus with unspecified complications: Secondary | ICD-10-CM | POA: Diagnosis not present

## 2022-08-18 DIAGNOSIS — I10 Essential (primary) hypertension: Secondary | ICD-10-CM | POA: Diagnosis not present

## 2022-08-18 DIAGNOSIS — E785 Hyperlipidemia, unspecified: Secondary | ICD-10-CM

## 2022-08-18 DIAGNOSIS — R208 Other disturbances of skin sensation: Secondary | ICD-10-CM

## 2022-08-18 DIAGNOSIS — M5412 Radiculopathy, cervical region: Secondary | ICD-10-CM | POA: Insufficient documentation

## 2022-08-18 DIAGNOSIS — D6869 Other thrombophilia: Secondary | ICD-10-CM | POA: Insufficient documentation

## 2022-08-18 DIAGNOSIS — I4819 Other persistent atrial fibrillation: Secondary | ICD-10-CM

## 2022-08-18 DIAGNOSIS — Z7984 Long term (current) use of oral hypoglycemic drugs: Secondary | ICD-10-CM

## 2022-08-18 LAB — CBC WITH DIFFERENTIAL/PLATELET
Basophils Absolute: 0 10*3/uL (ref 0.0–0.1)
Basophils Relative: 0.3 % (ref 0.0–3.0)
Eosinophils Absolute: 0.1 10*3/uL (ref 0.0–0.7)
Eosinophils Relative: 1.7 % (ref 0.0–5.0)
HCT: 46 % (ref 39.0–52.0)
Hemoglobin: 15 g/dL (ref 13.0–17.0)
Lymphocytes Relative: 14.7 % (ref 12.0–46.0)
Lymphs Abs: 0.9 10*3/uL (ref 0.7–4.0)
MCHC: 32.5 g/dL (ref 30.0–36.0)
MCV: 87.4 fl (ref 78.0–100.0)
Monocytes Absolute: 0.6 10*3/uL (ref 0.1–1.0)
Monocytes Relative: 10 % (ref 3.0–12.0)
Neutro Abs: 4.6 10*3/uL (ref 1.4–7.7)
Neutrophils Relative %: 73.3 % (ref 43.0–77.0)
Platelets: 166 10*3/uL (ref 150.0–400.0)
RBC: 5.26 Mil/uL (ref 4.22–5.81)
RDW: 14.7 % (ref 11.5–15.5)
WBC: 6.3 10*3/uL (ref 4.0–10.5)

## 2022-08-18 LAB — MICROALBUMIN / CREATININE URINE RATIO
Creatinine,U: 100.8 mg/dL
Microalb Creat Ratio: 9.9 mg/g (ref 0.0–30.0)
Microalb, Ur: 10 mg/dL — ABNORMAL HIGH (ref 0.0–1.9)

## 2022-08-18 LAB — COMPREHENSIVE METABOLIC PANEL WITH GFR
ALT: 19 U/L (ref 0–53)
AST: 21 U/L (ref 0–37)
Albumin: 4.2 g/dL (ref 3.5–5.2)
Alkaline Phosphatase: 64 U/L (ref 39–117)
BUN: 16 mg/dL (ref 6–23)
CO2: 29 meq/L (ref 19–32)
Calcium: 10.6 mg/dL — ABNORMAL HIGH (ref 8.4–10.5)
Chloride: 104 meq/L (ref 96–112)
Creatinine, Ser: 1.07 mg/dL (ref 0.40–1.50)
GFR: 66.85 mL/min
Glucose, Bld: 119 mg/dL — ABNORMAL HIGH (ref 70–99)
Potassium: 4.3 meq/L (ref 3.5–5.1)
Sodium: 139 meq/L (ref 135–145)
Total Bilirubin: 0.7 mg/dL (ref 0.2–1.2)
Total Protein: 7.3 g/dL (ref 6.0–8.3)

## 2022-08-18 LAB — LIPID PANEL
Cholesterol: 122 mg/dL (ref 0–200)
HDL: 49.2 mg/dL
LDL Cholesterol: 56 mg/dL (ref 0–99)
NonHDL: 72.83
Total CHOL/HDL Ratio: 2
Triglycerides: 85 mg/dL (ref 0.0–149.0)
VLDL: 17 mg/dL (ref 0.0–40.0)

## 2022-08-18 LAB — HEMOGLOBIN A1C: Hgb A1c MFr Bld: 6.9 % — ABNORMAL HIGH (ref 4.6–6.5)

## 2022-08-18 MED ORDER — POTASSIUM CHLORIDE ER 10 MEQ PO TBCR: 30.0000 meq | EXTENDED_RELEASE_TABLET | Freq: Every day | ORAL | 1 refills | Status: DC

## 2022-08-18 NOTE — Assessment & Plan Note (Signed)
Chronic   Lab Results  Component Value Date   HGBA1C 6.7 (H) 02/12/2022   Sugars daily Controlled Check A1c Continue metformin 500 mg twice daily Stressed regular exercise, diabetic diet'

## 2022-08-18 NOTE — Assessment & Plan Note (Signed)
New History of cervical spine surgery by Dr. Melven Sartorius For few months has been experiencing numbness in the right arm-no weakness or pain, no neck pain Discussed if symptoms continue he should see Dr. Yetta Barre with neurosurgery

## 2022-08-18 NOTE — Assessment & Plan Note (Signed)
New diagnosed earlier this month Following with urology-Dr. Marlou Porch To have PET scan this month and then meet with urology to discuss treatment options

## 2022-08-18 NOTE — Assessment & Plan Note (Signed)
Chronic Blood pressure well controlled CMP Continue lisinopril-hydrochlorothiazide 20-12.5 mg twice daily, diltiazem 180 mg twice daily 

## 2022-08-18 NOTE — Assessment & Plan Note (Signed)
Chronic °Regular exercise and healthy diet encouraged °Check lipid panel  °Continue atorvastatin 20 mg daily °

## 2022-08-18 NOTE — Assessment & Plan Note (Signed)
Chronic Secondary to persistent atrial fibrillation On Eliquis 5 mg twice daily

## 2022-08-18 NOTE — Assessment & Plan Note (Signed)
Chronic Follows with cardiology On Eliquis 5 mg twice daily, diltiazem 180 mg twice daily CBC, CMP

## 2022-08-18 NOTE — Assessment & Plan Note (Signed)
Chronic Controlled Following with urology On Flomax 0.4 mg daily and Hytrin 10 mg daily

## 2022-08-18 NOTE — Assessment & Plan Note (Signed)
Chronic CMP Continue potassium chloride 30 mEq daily

## 2022-08-18 NOTE — Assessment & Plan Note (Signed)
Chronic Probable neuropathy Continue gabapentin 100-200 mg daily as needed 

## 2022-08-19 NOTE — Progress Notes (Signed)
GU Location of Tumor / Histology: Prostate Ca  If Prostate Cancer, Gleason Score is (4 + 3) and PSA is (10.0 on 05/05/2022)  Biopsies      06/08/2022 Dr. Berniece Salines MR Prostate with/without Contrast CLINICAL DATA: 77 year old male with elevated PSA. PSA equal 10.0.   IMPRESSION: 1. LEFT mid gland peripheral zone lesion is highly concerning for prostate carcinoma. PI-RADS: 4. (Dynacad 3D post processing performed) ROI # 1. 2. Minimally enlarged nodular transitional zone most consistent with benign prostate hypertrophy. PI-RADS: 2.   Past/Anticipated interventions by urology, if any:   08/06/2022 Dr. Berniece Salines   Past/Anticipated interventions by medical oncology, if any:  NA  Weight changes, if any: No  IPSS:  10 SHIM:  8  Bowel/Bladder complaints, if any:  No bowel issue, and does have urinary frequency.  Nausea/Vomiting, if any:  No  Pain issues, if any:  Back and bilateral knees 5/10.  SAFETY ISSUES: Prior radiation?  No Pacemaker/ICD?  No Possible current pregnancy? Male Is the patient on methotrexate? No  Current Complaints / other details:  No

## 2022-08-28 ENCOUNTER — Encounter (HOSPITAL_COMMUNITY)
Admission: RE | Admit: 2022-08-28 | Discharge: 2022-08-28 | Disposition: A | Discharge status: Home / Self Care | Payer: Medicare HMO | Source: Ambulatory Visit | Attending: Urology | Admitting: Urology

## 2022-08-28 DIAGNOSIS — C61 Malignant neoplasm of prostate: Secondary | ICD-10-CM | POA: Diagnosis present

## 2022-08-28 MED ORDER — PIFLIFOLASTAT F 18 (PYLARIFY) INJECTION
9.0000 | Freq: Once | INTRAVENOUS | Status: AC
  Administered 2022-08-28: 9 via INTRAVENOUS

## 2022-08-30 NOTE — Progress Notes (Signed)
Radiation Oncology         [phone] ________________________________  Initial Outpatient Consultation  Name: Colin Love MRN: [id number]  Date: 08/31/2022  DOB: 04/24/1945  EX:BMWUX, Colin Mo, MD  Crist Fat, MD   REFERRING PHYSICIAN: Crist Fat, MD  DIAGNOSIS: 77 y.o. gentleman with Stage T1c adenocarcinoma of the prostate with Gleason score of 4+3, and PSA of 10.0.    ICD-10-CM   1. Malignant neoplasm of prostate (HCC)  C61       HISTORY OF PRESENT ILLNESS: Colin Love is a 77 y.o. male with a diagnosis of prostate cancer. He also has a history of bladder cancer in 2015, s/p TURBT 02/12/14. He was noted to have an elevated PSA of 10.0 on 05/06/22 by his urologist, Dr. Marlou Love. Patient underwent MRI of the prostate that showed a PI-RADS 4 lesion in the left prostate.  The patient proceeded to transrectal ultrasound with 12 biopsies of the prostate on 07/27/22.  The prostate volume measured 35 cc.  Out of 15 core biopsies, 15 were positive.  The maximum Gleason score was 4+3, and this was seen in all three ROI samples, left apex lateral, left mid, and left apex. Additionally, Gleason 3+4 was seen in right mid lateral, left base (small focus), left mid lateral, and left base lateral, and small foci of Gleason 3+3 in right apex lateral and right base. The patient proceeded with a PSMA PET scan on 08/28/22. Imaging showed a hypermetabolic activity in the left prostate, but no evidence of metastatic disease.   The patient reviewed the biopsy results with his urologist and he has kindly been referred today for discussion of potential radiation treatment options.   PREVIOUS RADIATION THERAPY: No  PAST MEDICAL HISTORY:  Past Medical History:  Diagnosis Date   A-fib (HCC)    Adenoma of left adrenal gland    At risk for sleep apnea    STOP-BANG= 4    SENT TO PCP 02-06-2014   Bladder cancer (HCC) 02/06/2014   local, low grade pap uro carcinoma   BPH (benign  prostatic hypertrophy)    Diverticulosis of colon    Full dentures    GERD (gastroesophageal reflux disease)    History of adenomatous polyp of colon    History of diverticulitis of colon    Hyperlipidemia    Hypertension    Murmur, cardiac    PAC (premature atrial contraction)    Type 2 diabetes mellitus (HCC)       PAST SURGICAL HISTORY: Past Surgical History:  Procedure Laterality Date   ANTERIOR CERVICAL DECOMP/DISCECTOMY FUSION  05/24/2003   C4  --  C7   CARDIOVERSION N/A 04/24/2021   Procedure: CARDIOVERSION;  Surgeon: Colin Mixer, MD;  [facility];  Service: Cardiovascular;  Laterality: N/A;   COLONOSCOPY W/ POLYPECTOMY  last one 07-24-2013 w/ EGD   CYSTOSCOPY W/ RETROGRADES Bilateral 02/09/2014   Procedure: BILATERAL RETROGRADE PYELOGRAM , Cystoscopy;  Surgeon: Crist Fat, MD;  Location: [facility];  Service: Urology;  Laterality: Bilateral;   PROSTATE BIOPSY  07/27/2022   TRANSURETHRAL RESECTION OF BLADDER TUMOR WITH GYRUS (TURBT-GYRUS) N/A 02/09/2014   Procedure: Bladder Biopsies;  Surgeon: Crist Fat, MD;  Location: [facility];  Service: Urology;  Laterality: N/A;    FAMILY HISTORY:  Family History  Problem Relation Age of Onset   Colon cancer Mother    Hypertension Mother    Hyperlipidemia Mother    Hypertension Father  Hyperlipidemia Father    Diabetes Brother     SOCIAL HISTORY:  Social History   Socioeconomic History   Marital status: Married    Spouse name: Not on file   Number of children: Not on file   Years of education: Not on file   Highest education level: Not on file  Occupational History   Not on file  Tobacco Use   Smoking status: Former    Packs/day: 0.50    Years: 40.00    Additional pack years: 0.00    Total pack years: 20.00    Types: Cigarettes    Quit date: 12/25/2021    Years since quitting: 0.6   Smokeless tobacco: Never   Tobacco comments:    Patient reports  has not smoked since he went to the hospital on 12/25/21  Vaping Use   Vaping Use: Never used  Substance and Sexual Activity   Alcohol use: Yes    Comment: OCCASIONAL   Drug use: No   Sexual activity: Not Currently  Other Topics Concern   Not on file  Social History Narrative   Not on file   Social Determinants of Health   Financial Resource Strain: Low Risk  (08/04/2022)   Overall Financial Resource Strain (CARDIA)    Difficulty of Paying Living Expenses: Not hard at all  Food Insecurity: No Food Insecurity (08/31/2022)   Hunger Vital Sign    Worried About Running Out of Food in the Last Year: Never true    Ran Out of Food in the Last Year: Never true  Transportation Needs: No Transportation Needs (08/31/2022)   PRAPARE - Administrator, Civil Service (Medical): No    Lack of Transportation (Non-Medical): No  Physical Activity: Sufficiently Active (08/04/2022)   Exercise Vital Sign    Days of Exercise per Week: 7 days    Minutes of Exercise per Session: 30 min  Stress: No Stress Concern Present (08/04/2022)   [employer] of Occupational Health - Occupational Stress Questionnaire    Feeling of Stress : Not at all  Social Connections: Socially Integrated (08/04/2022)   Social Connection and Isolation Panel [NHANES]    Frequency of Communication with Friends and Family: More than three times a week    Frequency of Social Gatherings with Friends and Family: More than three times a week    Attends Religious Services: More than 4 times per year    Active Member of [employer] or Organizations: Yes    Attends Banker Meetings: More than 4 times per year    Marital Status: Married  Catering manager Violence: Not At Risk (08/31/2022)   Humiliation, Afraid, Rape, and Kick questionnaire    Fear of Current or Ex-Partner: No    Emotionally Abused: No    Physically Abused: No    Sexually Abused: No    ALLERGIES: Patient has no known allergies.  MEDICATIONS:   Current Outpatient Medications  Medication Sig Dispense Refill   acetaminophen (TYLENOL) 500 MG tablet Take 1,000 mg by mouth every 6 (six) hours as needed for mild pain.     atorvastatin (LIPITOR) 20 MG tablet TAKE 1 TABLET BY MOUTH ONCE DAILY AT 6PM 90 tablet 3   diclofenac Sodium (VOLTAREN) 1 % GEL Apply 2 g topically daily as needed (pain).     diltiazem (CARDIZEM CD) 180 MG 24 hr capsule TAKE 1 CAPSULE BY MOUTH TWICE A DAY 180 capsule 3   ELIQUIS 5 MG TABS tablet TAKE 1 TABLET BY  MOUTH TWICE A DAY 60 tablet 5   gabapentin (NEURONTIN) 100 MG capsule Take 1-2 capsules (100-200 mg total) by mouth daily as needed. (Patient taking differently: Take 100-200 mg by mouth daily as needed (Pain).) 90 capsule 3   latanoprost (XALATAN) 0.005 % ophthalmic solution Place 1 drop into both eyes at bedtime.      lisinopril-hydrochlorothiazide (ZESTORETIC) 20-12.5 MG tablet TAKE 1 TABLET BY MOUTH TWICE A DAY 180 tablet 3   metFORMIN (GLUCOPHAGE) 500 MG tablet TAKE 1 TABLET BY MOUTH TWICE A DAY WITH MEALS 180 tablet 1   potassium chloride (KLOR-CON) 10 MEQ tablet Take 3 tablets (30 mEq total) by mouth daily. 270 tablet 1   tamsulosin (FLOMAX) 0.4 MG CAPS capsule Take 0.4 mg by mouth daily.     terazosin (HYTRIN) 10 MG capsule TAKE 1 CAPSULE BY MOUTH EVERYDAY AT BEDTIME 90 capsule 1   No current facility-administered medications for this encounter.    REVIEW OF SYSTEMS:  On review of systems, the patient reports that he is doing well overall. He denies any chest pain, shortness of breath, cough, fevers, chills, night sweats, unintended weight changes. He denies any bowel disturbances, and denies abdominal pain, nausea or vomiting. He denies any new musculoskeletal or joint aches or pains. His IPSS was 10, indicating mild urinary symptoms. His SHIM was 8, indicating he has moderate erectile dysfunction. A complete review of systems is obtained and is otherwise negative.    PHYSICAL EXAM:  Wt Readings from  Last 3 Encounters:  08/31/22 199 lb (90.3 kg)  08/18/22 200 lb (90.7 kg)  08/04/22 193 lb (87.5 kg)   Temp Readings from Last 3 Encounters:  08/31/22 (!) 97.3 F (36.3 C) (Temporal)  08/18/22 98.6 F (37 C) (Oral)  02/12/22 98.3 F (36.8 C) (Oral)   BP Readings from Last 3 Encounters:  08/31/22 137/71  08/18/22 120/72  04/13/22 136/82   Pulse Readings from Last 3 Encounters:  08/31/22 (!) 44  08/18/22 70  04/13/22 67   Pain Assessment Pain Score: 5  Pain Loc: Back (also both knees)/10  Physical exam not performed in light of telephone encounter.   KPS = 100  100 - Normal; no complaints; no evidence of disease. 90   - Able to carry on normal activity; minor signs or symptoms of disease. 80   - Normal activity with effort; some signs or symptoms of disease. 50   - Cares for self; unable to carry on normal activity or to do active work. 60   - Requires occasional assistance, but is able to care for most of his personal needs. 50   - Requires considerable assistance and frequent medical care. 40   - Disabled; requires special care and assistance. 30   - Severely disabled; hospital admission is indicated although death not imminent. 20   - Very sick; hospital admission necessary; active supportive treatment necessary. 10   - Moribund; fatal processes progressing rapidly. 0     - Dead  Karnofsky DA, Abelmann WH, Craver LS and Burchenal [facility] [phone]) The use of the nitrogen mustards in the palliative treatment of carcinoma: with particular reference to bronchogenic carcinoma Cancer 1 634-56  LABORATORY DATA:  Lab Results  Component Value Date   WBC 6.3 08/18/2022   HGB 15.0 08/18/2022   HCT 46.0 08/18/2022   MCV 87.4 08/18/2022   PLT 166.0 08/18/2022   Lab Results  Component Value Date   NA 139 08/18/2022   K 4.3 08/18/2022   CL  104 08/18/2022   CO2 29 08/18/2022   Lab Results  Component Value Date   ALT 19 08/18/2022   AST 21 08/18/2022   ALKPHOS 64 08/18/2022    BILITOT 0.7 08/18/2022     RADIOGRAPHY: No results found. We personally reviewed his images.     IMPRESSION/PLAN:  1. 77 y.o. gentleman with Stage T1c adenocarcinoma of the prostate with Gleason Score of 4+3, and PSA of 10.0. We discussed the patient's workup and outlined the nature of prostate cancer in this setting. The patient's T stage, Gleason's score, and PSA put him into the intermediate risk group. Accordingly, he is eligible for a variety of potential treatment options including brachytherapy, 5.5 weeks of external radiation, or prostatectomy. We discussed the available radiation techniques, and focused on the details and logistics and delivery. We discussed and outlined the risks, benefits, short and long-term effects associated with radiotherapy and compared and contrasted these with prostatectomy. We discussed the role of SpaceOAR in reducing the rectal toxicity associated with radiotherapy. He appears to have a good understanding of his disease and our treatment recommendations which are of curative intent.  He was encouraged to ask questions that were answered to his stated satisfaction.  At the end of the conversation the patient is interested in moving forward with brachytherapy. His moderate IPSS and normal prostate size suggests he is a good candidate. We will work with Dr. Jasmine Awe team to coordinate a procedure day. Patient knows to call with any questions or concerns in the meantime. We look forward to participating in this patient's care.   We personally spent 60 minutes in this encounter including chart review, reviewing radiological studies, meeting face-to-face with the patient, entering orders and completing documentation.    Joyice Faster, PA-C    Margaretmary Dys, MD  [facility]  Radiation Oncology Direct Dial: [phone]  Fax: [phone] [email or web address]  Skype  LinkedIn   This document serves as a record of services personally performed by Margaretmary Dys,  MD and Joyice Faster, PA-C. It was created on their behalf by Mickie Bail, a trained medical scribe. The creation of this record is based on the scribe's personal observations and the provider's statements to them. This document has been checked and approved by the attending provider.

## 2022-08-31 ENCOUNTER — Ambulatory Visit
Admission: RE | Admit: 2022-08-31 | Discharge: 2022-08-31 | Disposition: A | Discharge status: Home / Self Care | Payer: Medicare HMO | Source: Ambulatory Visit | Attending: Radiation Oncology | Admitting: Radiation Oncology

## 2022-08-31 ENCOUNTER — Encounter: Payer: Self-pay | Admitting: Radiation Oncology

## 2022-08-31 ENCOUNTER — Other Ambulatory Visit: Payer: Self-pay

## 2022-08-31 VITALS — BP 137/71 | HR 44 | Temp 97.3°F | Resp 18 | Ht 67.0 in | Wt 199.0 lb

## 2022-08-31 DIAGNOSIS — Z7984 Long term (current) use of oral hypoglycemic drugs: Secondary | ICD-10-CM | POA: Diagnosis not present

## 2022-08-31 DIAGNOSIS — E119 Type 2 diabetes mellitus without complications: Secondary | ICD-10-CM | POA: Insufficient documentation

## 2022-08-31 DIAGNOSIS — I10 Essential (primary) hypertension: Secondary | ICD-10-CM | POA: Insufficient documentation

## 2022-08-31 DIAGNOSIS — Z87891 Personal history of nicotine dependence: Secondary | ICD-10-CM | POA: Diagnosis not present

## 2022-08-31 DIAGNOSIS — Z79899 Other long term (current) drug therapy: Secondary | ICD-10-CM | POA: Insufficient documentation

## 2022-08-31 DIAGNOSIS — Z800 Family history of malignant neoplasm of digestive organs: Secondary | ICD-10-CM | POA: Diagnosis not present

## 2022-08-31 DIAGNOSIS — E785 Hyperlipidemia, unspecified: Secondary | ICD-10-CM | POA: Diagnosis not present

## 2022-08-31 DIAGNOSIS — Z86010 Personal history of colonic polyps: Secondary | ICD-10-CM | POA: Diagnosis not present

## 2022-08-31 DIAGNOSIS — Z8551 Personal history of malignant neoplasm of bladder: Secondary | ICD-10-CM | POA: Diagnosis not present

## 2022-08-31 DIAGNOSIS — I4891 Unspecified atrial fibrillation: Secondary | ICD-10-CM | POA: Insufficient documentation

## 2022-08-31 DIAGNOSIS — N400 Enlarged prostate without lower urinary tract symptoms: Secondary | ICD-10-CM | POA: Diagnosis not present

## 2022-08-31 DIAGNOSIS — Z191 Hormone sensitive malignancy status: Secondary | ICD-10-CM | POA: Diagnosis not present

## 2022-08-31 DIAGNOSIS — Z7901 Long term (current) use of anticoagulants: Secondary | ICD-10-CM | POA: Insufficient documentation

## 2022-08-31 DIAGNOSIS — C61 Malignant neoplasm of prostate: Secondary | ICD-10-CM

## 2022-08-31 DIAGNOSIS — K219 Gastro-esophageal reflux disease without esophagitis: Secondary | ICD-10-CM | POA: Insufficient documentation

## 2022-08-31 NOTE — Progress Notes (Signed)
Introduced myself to the patient, and his wife, as the prostate nurse navigator.  No barriers to care identified at this time.  He is here to discuss his radiation treatment options.  I gave him my business card and asked him to call me with questions or concerns.  Verbalized understanding.  

## 2022-09-02 ENCOUNTER — Telehealth: Payer: Self-pay | Admitting: *Deleted

## 2022-09-02 NOTE — Telephone Encounter (Signed)
Patient with diagnosis of afib on Eliquis for anticoagulation.    Procedure: brachytherapy with SpaceOAR Date of procedure: 11/20/22  CHA2DS2-VASc Score = 4  This indicates a 4.8% annual risk of stroke. The patient's score is based upon: CHF History: 0 HTN History: 1 Diabetes History: 1 Stroke History: 0 Vascular Disease History: 0 Age Score: 2 Gender Score: 0  CrCl 44mL/min using adjusted body weight Platelet count 166K  Per office protocol, patient can hold Eliquis for 2-3 days prior to procedure.    **This guidance is not considered finalized until pre-operative APP has relayed final recommendations.**

## 2022-09-02 NOTE — Telephone Encounter (Signed)
Called patient to update, lvm for a return call ?

## 2022-09-02 NOTE — Telephone Encounter (Signed)
Tried to reach pt to schedule a tele pre op appt sometime in August.

## 2022-09-02 NOTE — Telephone Encounter (Signed)
   Name: Colin Love  DOB: [birth year]  MRN: [id number]  Primary Cardiologist: Donato Schultz, MD   Preoperative team, please contact this patient and set up a phone call appointment for further preoperative risk assessment. Please obtain consent and complete medication review. Thank you for your help.  Per office protocol, patient can hold Eliquis for 2-3 days prior to procedure.    I confirm that guidance regarding antiplatelet and oral anticoagulation therapy has been completed and, if necessary, noted below.   Napoleon Form, Leodis Rains, NP 09/02/2022, 11:17 AM [facility]

## 2022-09-02 NOTE — Telephone Encounter (Signed)
   Pre-operative Risk Assessment    Patient Name: Colin Love  DOB: [birth year] MRN: [id number]      Request for Surgical Clearance    Procedure:   BRACHYTHERAPY WITH SPACEOAR   Date of Surgery:  Clearance 11/20/22                                 Surgeon:  DR. Berniece Salines Surgeon's Group or Practice Name:  [facility] Phone number:  [phone] Fax number:  [phone]   Type of Clearance Requested:   - Medical  - Pharmacy:  Hold Apixaban (Eliquis) NOT INDICATED HOW LONG   Type of Anesthesia:  Not Indicated   Additional requests/questions:    [name]   09/02/2022, 9:52 AM

## 2022-09-02 NOTE — Telephone Encounter (Signed)
Pharmacy please advise on holding Eliquis prior to brachytherapy with SpaceOAR scheduled for 11/20/2022. Thank you.

## 2022-09-04 NOTE — Telephone Encounter (Signed)
Left message for the patient to contact the office to schedule pre-op appointment.

## 2022-09-07 ENCOUNTER — Telehealth: Payer: Self-pay | Admitting: *Deleted

## 2022-09-07 NOTE — Telephone Encounter (Signed)
Pt has called back and has been scheduled tele pre op appt 11/02/22 @ 9:20. Med rec and consent are done.

## 2022-09-07 NOTE — Telephone Encounter (Signed)
Pt has called back and has been scheduled tele pre op appt 11/02/22 @ 9:20. Med rec and consent are done.     Patient Consent for Virtual Visit        [name] has provided verbal consent on 09/07/2022 for a virtual visit (video or telephone).   CONSENT FOR VIRTUAL VISIT FOR:  Colin Love  By participating in this virtual visit I agree to the following:  I hereby voluntarily request, consent and authorize [facility] and its employed or contracted physicians, physician assistants, nurse practitioners or other licensed health care professionals (the Practitioner), to provide me with telemedicine health care services (the "Services") as deemed necessary by the treating Practitioner. I acknowledge and consent to receive the Services by the Practitioner via telemedicine. I understand that the telemedicine visit will involve communicating with the Practitioner through live audiovisual communication technology and the disclosure of certain medical information by electronic transmission. I acknowledge that I have been given the opportunity to request an in-person assessment or other available alternative prior to the telemedicine visit and am voluntarily participating in the telemedicine visit.  I understand that I have the right to withhold or withdraw my consent to the use of telemedicine in the course of my care at any time, without affecting my right to future care or treatment, and that the Practitioner or I may terminate the telemedicine visit at any time. I understand that I have the right to inspect all information obtained and/or recorded in the course of the telemedicine visit and may receive copies of available information for a reasonable fee.  I understand that some of the potential risks of receiving the Services via telemedicine include:  Delay or interruption in medical evaluation due to technological equipment failure or disruption; Information transmitted may not be  sufficient (e.g. poor resolution of images) to allow for appropriate medical decision making by the Practitioner; and/or  In rare instances, security protocols could fail, causing a breach of personal health information.  Furthermore, I acknowledge that it is my responsibility to provide information about my medical history, conditions and care that is complete and accurate to the best of my ability. I acknowledge that Practitioner's advice, recommendations, and/or decision may be based on factors not within their control, such as incomplete or inaccurate data provided by me or distortions of diagnostic images or specimens that may result from electronic transmissions. I understand that the practice of medicine is not an exact science and that Practitioner makes no warranties or guarantees regarding treatment outcomes. I acknowledge that a copy of this consent can be made available to me via my patient portal [facility] MyChart), or I can request a printed copy by calling the office of [facility].    I understand that my insurance will be billed for this visit.   I have read or had this consent read to me. I understand the contents of this consent, which adequately explains the benefits and risks of the Services being provided via telemedicine.  I have been provided ample opportunity to ask questions regarding this consent and the Services and have had my questions answered to my satisfaction. I give my informed consent for the services to be provided through the use of telemedicine in my medical care

## 2022-09-08 NOTE — Progress Notes (Signed)
RN left message for patient to call back to assess any additional needs or questions prior to CT Simulation, and brachytherapy.

## 2022-09-14 ENCOUNTER — Encounter: Payer: Self-pay | Admitting: Internal Medicine

## 2022-09-14 DIAGNOSIS — H2513 Age-related nuclear cataract, bilateral: Secondary | ICD-10-CM | POA: Diagnosis not present

## 2022-09-14 DIAGNOSIS — E119 Type 2 diabetes mellitus without complications: Secondary | ICD-10-CM | POA: Diagnosis not present

## 2022-09-14 DIAGNOSIS — H401131 Primary open-angle glaucoma, bilateral, mild stage: Secondary | ICD-10-CM | POA: Diagnosis not present

## 2022-09-14 DIAGNOSIS — H524 Presbyopia: Secondary | ICD-10-CM | POA: Diagnosis not present

## 2022-09-14 DIAGNOSIS — H52203 Unspecified astigmatism, bilateral: Secondary | ICD-10-CM | POA: Diagnosis not present

## 2022-09-14 DIAGNOSIS — H25013 Cortical age-related cataract, bilateral: Secondary | ICD-10-CM | POA: Diagnosis not present

## 2022-09-14 LAB — HM DIABETES EYE EXAM

## 2022-09-22 ENCOUNTER — Telehealth: Payer: Self-pay | Admitting: *Deleted

## 2022-09-22 NOTE — Telephone Encounter (Signed)
Called patient to remind of pre-seed appts. for 09-24-22, spoke with patient and he is aware of these appts.

## 2022-09-24 ENCOUNTER — Other Ambulatory Visit: Payer: Self-pay | Admitting: Urology

## 2022-09-24 ENCOUNTER — Ambulatory Visit
Admission: RE | Admit: 2022-09-24 | Discharge: 2022-09-24 | Disposition: A | Discharge status: Home / Self Care | Payer: Medicare HMO | Source: Ambulatory Visit | Attending: Urology | Admitting: Urology

## 2022-09-24 ENCOUNTER — Other Ambulatory Visit: Payer: Self-pay

## 2022-09-24 ENCOUNTER — Encounter: Payer: Self-pay | Admitting: Urology

## 2022-09-24 ENCOUNTER — Ambulatory Visit
Admission: RE | Admit: 2022-09-24 | Discharge: 2022-09-24 | Disposition: A | Discharge status: Home / Self Care | Payer: Medicare HMO | Source: Ambulatory Visit | Attending: Radiation Oncology | Admitting: Radiation Oncology

## 2022-09-24 VITALS — Resp 19 | Ht 67.0 in | Wt 198.0 lb

## 2022-09-24 DIAGNOSIS — C61 Malignant neoplasm of prostate: Secondary | ICD-10-CM | POA: Diagnosis present

## 2022-09-24 DIAGNOSIS — Z191 Hormone sensitive malignancy status: Secondary | ICD-10-CM | POA: Diagnosis not present

## 2022-09-24 NOTE — Progress Notes (Incomplete)
  Radiation Oncology         [phone] ________________________________  Name: Colin Love MRN: [id number]  Date: 09/24/2022  DOB: 10/04/1945  SIMULATION AND TREATMENT PLANNING NOTE PUBIC ARCH STUDY  NW:GNFAO, Bobette Mo, MD  Crist Fat, MD  DIAGNOSIS: 77 y.o. gentleman with Stage T1c adenocarcinoma of the prostate with Gleason score of 4+3, and PSA of 10.0.   Oncology History   No history exists.      ICD-10-CM   1. Prostate cancer (HCC)  C61       COMPLEX SIMULATION:  The patient presented today for evaluation for possible prostate seed implant. He was brought to the radiation planning suite and placed supine on the CT couch. A 3-dimensional image study set was obtained in upload to the planning computer. There, on each axial slice, I contoured the prostate gland. Then, using three-dimensional radiation planning tools I reconstructed the prostate in view of the structures from the transperineal needle pathway to assess for possible pubic arch interference. In doing so, I did not appreciate any pubic arch interference. Also, the patient's prostate volume was estimated based on the drawn structure. The volume was 35 cc.  Given the pubic arch appearance and prostate volume, patient remains a good candidate to proceed with prostate seed implant. Today, he freely provided informed written consent to proceed.    PLAN: The patient will undergo prostate seed implant.   ________________________________  Artist Pais. Kathrynn Running, M.D.

## 2022-09-24 NOTE — Progress Notes (Signed)
Pre-seed nursing interview for a diagnosis of Stage T1c adenocarcinoma of the prostate with Gleason score of 4+3, and PSA of 10.0.  Patient identity verified x2.   Patient reports occasional polyuria. No other issues conveyed at this time.  Meaningful use complete.  Urinary Management medication(s)- None Urology appointment date- None currently, with Dr. Marlou Porch at [facility]  Resp 19   Ht 5\' 7"  (1.702 m)   Wt 198 lb (89.8 kg)   BMI 31.01 kg/m   This concludes the interaction.  Colin Favors, LPN

## 2022-09-24 NOTE — Progress Notes (Signed)
Radiation Oncology         [phone] ________________________________  Outpatient Follow up- Pre-seed visit  Name: Colin Love MRN: [id number]  Date: 09/24/2022  DOB: 21-May-1945  WU:[id number], Bobette Mo, MD  Crist Fat, MD   REFERRING PHYSICIAN: Crist Fat, MD  DIAGNOSIS: 77 y.o. gentleman with Stage T1c adenocarcinoma of the prostate with Gleason score of 4+3, and PSA of 10.0.     ICD-10-CM   1. Prostate cancer [facility])  C61       HISTORY OF PRESENT ILLNESS: Colin Love is a 77 y.o. male with a diagnosis of prostate cancer.  He also has a history of bladder cancer in 2015, s/p TURBT 02/12/14. He was noted to have an elevated PSA of 10.0 on 05/06/22 by his urologist, Dr. Marlou Porch. Patient underwent MRI of the prostate that showed a PI-RADS 4 lesion in the left prostate.  The patient proceeded to transrectal ultrasound with 12 biopsies of the prostate on 07/27/22.  The prostate volume measured 35 cc.  Out of 15 core biopsies, 15 were positive.  The maximum Gleason score was 4+3, and this was seen in all three ROI samples, left apex lateral, left mid, and left apex. Additionally, Gleason 3+4 was seen in right mid lateral, left base (small focus), left mid lateral, and left base lateral, and small foci of Gleason 3+3 in right apex lateral and right base. The patient proceeded with a PSMA PET scan on 08/28/22. Imaging showed a hypermetabolic activity in the left prostate, but no evidence of metastatic disease.   The patient reviewed the biopsy results with his urologist and was kindly referred to [place] for discussion of potential radiation treatment options. We initially met the patient on 08/31/22 and he was most interested in proceeding with brachytherapy and SpaceOAR gel placement for treatment of his disease. He is here today for his pre-procedure imaging for planning and to answer any additional questions he may have about this treatment.   PREVIOUS RADIATION THERAPY:  No  PAST MEDICAL HISTORY:  Past Medical History:  Diagnosis Date   A-fib (HCC)    Adenoma of left adrenal gland    At risk for sleep apnea    STOP-BANG= 4    SENT TO PCP 02-06-2014   Bladder cancer (HCC) 02/06/2014   local, low grade pap uro carcinoma   BPH (benign prostatic hypertrophy)    Diverticulosis of colon    Full dentures    GERD (gastroesophageal reflux disease)    History of adenomatous polyp of colon    History of diverticulitis of colon    Hyperlipidemia    Hypertension    Murmur, cardiac    PAC (premature atrial contraction)    Type 2 diabetes mellitus (HCC)       PAST SURGICAL HISTORY: Past Surgical History:  Procedure Laterality Date   ANTERIOR CERVICAL DECOMP/DISCECTOMY FUSION  05/24/2003   C4  --  C7   CARDIOVERSION N/A 04/24/2021   Procedure: CARDIOVERSION;  Surgeon: Vesta Mixer, MD;  Location: [facility] ENDOSCOPY;  Service: Cardiovascular;  Laterality: N/A;   COLONOSCOPY W/ POLYPECTOMY  last one 07-24-2013 w/ EGD   CYSTOSCOPY W/ RETROGRADES Bilateral 02/09/2014   Procedure: BILATERAL RETROGRADE PYELOGRAM , Cystoscopy;  Surgeon: Crist Fat, MD;  Location: [facility];  Service: Urology;  Laterality: Bilateral;   PROSTATE BIOPSY  07/27/2022   TRANSURETHRAL RESECTION OF BLADDER TUMOR WITH GYRUS (TURBT-GYRUS) N/A 02/09/2014   Procedure: Bladder Biopsies;  Surgeon: Crist Fat,  MD;  Location: [facility];  Service: Urology;  Laterality: N/A;    FAMILY HISTORY:  Family History  Problem Relation Age of Onset   Colon cancer Mother    Hypertension Mother    Hyperlipidemia Mother    Hypertension Father    Hyperlipidemia Father    Diabetes Brother     SOCIAL HISTORY:  Social History   Socioeconomic History   Marital status: Married    Spouse name: Not on file   Number of children: Not on file   Years of education: Not on file   Highest education level: Not on file  Occupational History   Not on file   Tobacco Use   Smoking status: Former    Current packs/day: 0.00    Average packs/day: 0.5 packs/day for 40.0 years (20.0 ttl pk-yrs)    Types: Cigarettes    Start date: 12/25/1981    Quit date: 12/25/2021    Years since quitting: 0.7   Smokeless tobacco: Never   Tobacco comments:    Patient reports has not smoked since he went to the hospital on 12/25/21  Vaping Use   Vaping status: Never Used  Substance and Sexual Activity   Alcohol use: Yes    Comment: OCCASIONAL   Drug use: No   Sexual activity: Not Currently  Other Topics Concern   Not on file  Social History Narrative   Not on file   Social Determinants of Health   Financial Resource Strain: Low Risk  (08/04/2022)   Overall Financial Resource Strain (CARDIA)    Difficulty of Paying Living Expenses: Not hard at all  Food Insecurity: No Food Insecurity (08/31/2022)   Hunger Vital Sign    Worried About Running Out of Food in the Last Year: Never true    Ran Out of Food in the Last Year: Never true  Transportation Needs: No Transportation Needs (08/31/2022)   PRAPARE - Administrator, Civil Service (Medical): No    Lack of Transportation (Non-Medical): No  Physical Activity: Sufficiently Active (08/04/2022)   Exercise Vital Sign    Days of Exercise per Week: 7 days    Minutes of Exercise per Session: 30 min  Stress: No Stress Concern Present (08/04/2022)   [employer] of Occupational Health - Occupational Stress Questionnaire    Feeling of Stress : Not at all  Social Connections: Socially Integrated (08/04/2022)   Social Connection and Isolation Panel [NHANES]    Frequency of Communication with Friends and Family: More than three times a week    Frequency of Social Gatherings with Friends and Family: More than three times a week    Attends Religious Services: More than 4 times per year    Active Member of [employer] or Organizations: Yes    Attends Banker Meetings: More than 4 times per year     Marital Status: Married  Catering manager Violence: Not At Risk (08/31/2022)   Humiliation, Afraid, Rape, and Kick questionnaire    Fear of Current or Ex-Partner: No    Emotionally Abused: No    Physically Abused: No    Sexually Abused: No    ALLERGIES: Patient has no known allergies.  MEDICATIONS:  Current Outpatient Medications  Medication Sig Dispense Refill   acetaminophen (TYLENOL) 500 MG tablet Take 1,000 mg by mouth every 6 (six) hours as needed for mild pain.     atorvastatin (LIPITOR) 20 MG tablet TAKE 1 TABLET BY MOUTH ONCE DAILY AT 6PM 90 tablet  3   diclofenac Sodium (VOLTAREN) 1 % GEL Apply 2 g topically daily as needed (pain).     diltiazem (CARDIZEM CD) 180 MG 24 hr capsule TAKE 1 CAPSULE BY MOUTH TWICE A DAY 180 capsule 3   ELIQUIS 5 MG TABS tablet TAKE 1 TABLET BY MOUTH TWICE A DAY 60 tablet 5   gabapentin (NEURONTIN) 100 MG capsule Take 1-2 capsules (100-200 mg total) by mouth daily as needed. (Patient taking differently: Take 100-200 mg by mouth daily as needed (Pain).) 90 capsule 3   latanoprost (XALATAN) 0.005 % ophthalmic solution Place 1 drop into both eyes at bedtime.      lisinopril-hydrochlorothiazide (ZESTORETIC) 20-12.5 MG tablet TAKE 1 TABLET BY MOUTH TWICE A DAY 180 tablet 3   metFORMIN (GLUCOPHAGE) 500 MG tablet TAKE 1 TABLET BY MOUTH TWICE A DAY WITH MEALS 180 tablet 1   potassium chloride (KLOR-CON) 10 MEQ tablet Take 3 tablets (30 mEq total) by mouth daily. 270 tablet 1   tamsulosin (FLOMAX) 0.4 MG CAPS capsule Take 0.4 mg by mouth daily.     terazosin (HYTRIN) 10 MG capsule TAKE 1 CAPSULE BY MOUTH EVERYDAY AT BEDTIME 90 capsule 1   No current facility-administered medications for this encounter.    REVIEW OF SYSTEMS:On review of systems, the patient reports that he is doing well overall. He denies any chest pain, shortness of breath, cough, fevers, chills, night sweats, unintended weight changes. He denies any bowel disturbances, and denies abdominal  pain, nausea or vomiting. He denies any new musculoskeletal or joint aches or pains. His IPSS was 10, indicating mild-moderate urinary symptoms with nocturia x2, urgency and frequency on Flomax qhs. His SHIM was 8, indicating he has moderate erectile dysfunction. A complete review of systems is obtained and is otherwise negative.     PHYSICAL EXAM:  Wt Readings from Last 3 Encounters:  09/24/22 198 lb (89.8 kg)  08/31/22 199 lb (90.3 kg)  08/18/22 200 lb (90.7 kg)   Temp Readings from Last 3 Encounters:  08/31/22 (!) 97.3 F (36.3 C) (Temporal)  08/18/22 98.6 F (37 C) (Oral)  02/12/22 98.3 F (36.8 C) (Oral)   BP Readings from Last 3 Encounters:  08/31/22 137/71  08/18/22 120/72  04/13/22 136/82   Pulse Readings from Last 3 Encounters:  08/31/22 (!) 44  08/18/22 70  04/13/22 67   Pain Assessment Pain Score: 0-No pain/10  In general this is a well appearing African American male in no acute distress. He's alert and oriented x4 and appropriate throughout the examination. Cardiopulmonary assessment is negative for acute distress, and he exhibits normal effort.     KPS = 100  100 - Normal; no complaints; no evidence of disease. 90   - Able to carry on normal activity; minor signs or symptoms of disease. 80   - Normal activity with effort; some signs or symptoms of disease. 86   - Cares for self; unable to carry on normal activity or to do active work. 60   - Requires occasional assistance, but is able to care for most of his personal needs. 50   - Requires considerable assistance and frequent medical care. 40   - Disabled; requires special care and assistance. 30   - Severely disabled; hospital admission is indicated although death not imminent. 20   - Very sick; hospital admission necessary; active supportive treatment necessary. 10   - Moribund; fatal processes progressing rapidly. 0     - Dead  Karnofsky DA, Abelmann WH, Craver  LS and Burchenal JH (1948) The use of the  nitrogen mustards in the palliative treatment of carcinoma: with particular reference to bronchogenic carcinoma Cancer 1 634-56  LABORATORY DATA:  Lab Results  Component Value Date   WBC 6.3 08/18/2022   HGB 15.0 08/18/2022   HCT 46.0 08/18/2022   MCV 87.4 08/18/2022   PLT 166.0 08/18/2022   Lab Results  Component Value Date   NA 139 08/18/2022   K 4.3 08/18/2022   CL 104 08/18/2022   CO2 29 08/18/2022   Lab Results  Component Value Date   ALT 19 08/18/2022   AST 21 08/18/2022   ALKPHOS 64 08/18/2022   BILITOT 0.7 08/18/2022     RADIOGRAPHY: NM PET (PSMA) SKULL TO MID THIGH  Result Date: 08/31/2022 CLINICAL DATA:  Newly diagnosed high-risk prostate carcinoma. Staging. EXAM: NUCLEAR MEDICINE PET SKULL BASE TO THIGH TECHNIQUE: 9.8 mCi F18 Piflufolastat (Pylarify) was injected intravenously. Full-ring PET imaging was performed from the skull base to thigh after the radiotracer. CT data was obtained and used for attenuation correction and anatomic localization. COMPARISON:  CT on 12/27/2013 FINDINGS: NECK No radiotracer activity in neck lymph nodes. Incidental CT finding: None. CHEST No radiotracer accumulation within mediastinal or hilar lymph nodes. No suspicious pulmonary nodules on the CT scan. Incidental CT finding: Moderate cardiomegaly. Aortic and coronary atherosclerotic calcification incidentally noted. ABDOMEN/PELVIS Prostate: Mildly enlarged. Focal radiotracer accumulation is seen in the left lateral prostate gland which has SUV max of 8.6, and is consistent with primary prostate carcinoma. Lymph nodes: No abnormal radiotracer accumulation within pelvic or abdominal nodes. Liver: No evidence of liver metastasis. Incidental CT finding: Stable bilateral low-attenuation adrenal masses, without radiotracer accumulation, consistent benign adrenal adenomas. Colonic diverticulosis again noted, without evidence of diverticulitis. SKELETON No focal activity to suggest skeletal metastasis.  IMPRESSION: Focal radiotracer accumulation in left lateral prostate gland, consistent with primary prostate carcinoma. No evidence of metastatic disease. Stable benign bilateral adrenal adenomas. Colonic diverticulosis, without radiographic evidence of diverticulitis. Aortic Atherosclerosis (ICD10-I70.0). Electronically Signed   By: Danae Orleans M.D.   On: 08/31/2022 10:22      IMPRESSION/PLAN: 1. 77 y.o. gentleman with SStage T1c adenocarcinoma of the prostate with Gleason score of 4+3, and PSA of 10.0.  The patient has elected to proceed with seed implant for treatment of his disease. We reviewed the risks, benefits, short and long-term effects associated with brachytherapy and discussed the role of SpaceOAR in reducing the rectal toxicity associated with radiotherapy.  He appears to have a good understanding of his disease and our treatment recommendations which are of curative intent.  He was encouraged to ask questions that were answered to his stated satisfaction. He has freely signed written consent to proceed today in the office and a copy of this document will be placed in his medical record. His procedure is tentatively scheduled for 11/20/22 in collaboration with Dr. Marlou Porch and we will see him back for his post-procedure visit approximately 3 weeks thereafter. We look forward to continuing to participate in his care. He knows that he is welcome to call with any questions or concerns at any time in the interim.  I personally spent 30 minutes in this encounter including chart review, reviewing radiological studies, meeting face-to-face with the patient, entering orders and completing documentation.    Marguarite Arbour, MMS, PA-C [place]  [facility] Radiation Oncology Physician Assistant Direct Dial: [phone]  Fax: [phone]

## 2022-09-27 NOTE — Addendum Note (Signed)
Encounter addended by: Margaretmary Dys, MD on: 09/27/2022 5:08 PM  Actions taken: Clinical Note Signed

## 2022-10-06 ENCOUNTER — Telehealth: Payer: Self-pay | Admitting: *Deleted

## 2022-10-06 NOTE — Telephone Encounter (Signed)
CALLED PATIENT TO INFORM THAT IMPLANT DATE HAS CHANGED TO 01-06-23 @ 2 PM, SPOKE WITH PATIENT AND HE IS AWARE OF THIS CHANGE AND IS GOOD WITH THIS CHANGE

## 2022-10-12 ENCOUNTER — Other Ambulatory Visit: Payer: Self-pay | Admitting: Urology

## 2022-10-22 ENCOUNTER — Encounter (HOSPITAL_COMMUNITY): Payer: Self-pay

## 2022-10-22 ENCOUNTER — Other Ambulatory Visit: Payer: Self-pay | Admitting: Internal Medicine

## 2022-10-22 ENCOUNTER — Ambulatory Visit (HOSPITAL_COMMUNITY)
Admission: EM | Admit: 2022-10-22 | Discharge: 2022-10-22 | Disposition: A | Discharge status: Home / Self Care | Payer: Medicare HMO | Attending: Emergency Medicine | Admitting: Emergency Medicine

## 2022-10-22 DIAGNOSIS — R350 Frequency of micturition: Secondary | ICD-10-CM | POA: Diagnosis present

## 2022-10-22 DIAGNOSIS — N390 Urinary tract infection, site not specified: Secondary | ICD-10-CM | POA: Insufficient documentation

## 2022-10-22 LAB — POCT URINALYSIS DIP (MANUAL ENTRY)
Bilirubin, UA: NEGATIVE
Glucose, UA: NEGATIVE mg/dL
Ketones, POC UA: NEGATIVE mg/dL
Nitrite, UA: NEGATIVE
Protein Ur, POC: >=300 mg/dL — AB
Spec Grav, UA: >=1.03 — AB
Urobilinogen, UA: 0.2 U/dL
pH, UA: 5.5

## 2022-10-22 MED ORDER — CEPHALEXIN 500 MG PO CAPS: 500.0000 mg | ORAL_CAPSULE | Freq: Four times a day (QID) | ORAL | 0 refills | Status: DC

## 2022-10-22 NOTE — ED Provider Notes (Signed)
[facility]    CSN: [id number] Arrival date & time: 10/22/22  1146      History   Chief Complaint Chief Complaint  Patient presents with   Urinary Tract Infection    HPI Colin Love is a 77 y.o. male.   Patient presents today with burning on urination and frequency since last night.  Patient states the symptoms are very similar to a urinary tract infection he has had in the past.  Patient does have prostate cancer and is to have the seed placed in October.  Patient does have a urologist that he is following up with.  Has not taken any medications prior to arrival.  Denies any nausea vomiting diarrhea, fevers or shortness of breath.    Past Medical History:  Diagnosis Date   A-fib (HCC)    Adenoma of left adrenal gland    At risk for sleep apnea    STOP-BANG= 4    SENT TO PCP 02-06-2014   Bladder cancer (HCC) 02/06/2014   local, low grade pap uro carcinoma   BPH (benign prostatic hypertrophy)    Diverticulosis of colon    Full dentures    GERD (gastroesophageal reflux disease)    History of adenomatous polyp of colon    History of diverticulitis of colon    Hyperlipidemia    Hypertension    Murmur, cardiac    PAC (premature atrial contraction)    Type 2 diabetes mellitus (HCC)     Patient Active Problem List   Diagnosis Date Noted   Secondary hypercoagulable state (HCC) 08/18/2022   Prostate cancer (HCC) 08/18/2022   Cervical radiculopathy 08/18/2022   Community acquired pneumonia of right middle lobe of lung 12/25/2021   Diverticular disease of colon 12/25/2021   Gastroesophageal reflux disease 12/25/2021   Internal hemorrhoids 12/25/2021   Hypokalemia 12/25/2021   Moderate protein malnutrition (HCC) 12/25/2021   Transaminitis 12/25/2021   Persistent atrial fibrillation (HCC) 03/28/2021   BPH (benign prostatic hyperplasia) 08/08/2020   Spinal stenosis of lumbar region without neurogenic claudication 01/31/2020   Chondrocalcinosis of left knee  03/21/2019   Sciatica of right side 08/04/2018   Abnormal echocardiogram 05/26/2016   Burning sensation of feet 05/26/2016   Sudden right hearing loss 08/07/2015   Glaucoma 05/21/2015   PAC (premature atrial contraction) 01/24/2014   Bladder cancer (HCC) 01/09/2014   Tobacco dependence due to cigarettes    Diabetes type 2, controlled (HCC) 12/23/2009   Dyslipidemia 12/23/2009   Essential hypertension 12/23/2009   COLONIC POLYPS, HX OF 12/23/2009   DIVERTICULITIS, HX OF 12/23/2009    Past Surgical History:  Procedure Laterality Date   ANTERIOR CERVICAL DECOMP/DISCECTOMY FUSION  05/24/2003   C4  --  C7   CARDIOVERSION N/A 04/24/2021   Procedure: CARDIOVERSION;  Surgeon: Vesta Mixer, MD;  Location: [facility] ENDOSCOPY;  Service: Cardiovascular;  Laterality: N/A;   COLONOSCOPY W/ POLYPECTOMY  last one 07-24-2013 w/ EGD   CYSTOSCOPY W/ RETROGRADES Bilateral 02/09/2014   Procedure: BILATERAL RETROGRADE PYELOGRAM , Cystoscopy;  Surgeon: Crist Fat, MD;  Location: [facility];  Service: Urology;  Laterality: Bilateral;   PROSTATE BIOPSY  07/27/2022   TRANSURETHRAL RESECTION OF BLADDER TUMOR WITH GYRUS (TURBT-GYRUS) N/A 02/09/2014   Procedure: Bladder Biopsies;  Surgeon: Crist Fat, MD;  Location: [facility];  Service: Urology;  Laterality: N/A;       Home Medications    Prior to Admission medications   Medication Sig Start Date End Date  Taking? Authorizing Provider  cephALEXin (KEFLEX) 500 MG capsule Take 1 capsule (500 mg total) by mouth 4 (four) times daily. 10/22/22  Yes Coralyn Mark, NP  acetaminophen (TYLENOL) 500 MG tablet Take 1,000 mg by mouth every 6 (six) hours as needed for mild pain.    [provider]  atorvastatin (LIPITOR) 20 MG tablet TAKE 1 TABLET BY MOUTH ONCE DAILY AT 6PM 07/17/22   Pincus Sanes, MD  diclofenac Sodium (VOLTAREN) 1 % GEL Apply 2 g topically daily as needed (pain).    [provider]  diltiazem (CARDIZEM CD) 180 MG 24 hr capsule TAKE 1 CAPSULE BY MOUTH TWICE A DAY 07/22/22   Burns, Bobette Mo, MD  ELIQUIS 5 MG TABS tablet TAKE 1 TABLET BY MOUTH TWICE A DAY 06/05/22   Jake Bathe, MD  gabapentin (NEURONTIN) 100 MG capsule Take 1-2 capsules (100-200 mg total) by mouth daily as needed. Patient taking differently: Take 100-200 mg by mouth daily as needed (Pain). 02/07/19   Burns, Bobette Mo, MD  latanoprost (XALATAN) 0.005 % ophthalmic solution Place 1 drop into both eyes at bedtime.  10/17/15   [provider]  lisinopril-hydrochlorothiazide (ZESTORETIC) 20-12.5 MG tablet TAKE 1 TABLET BY MOUTH TWICE A DAY 06/08/22   Burns, Bobette Mo, MD  metFORMIN (GLUCOPHAGE) 500 MG tablet TAKE 1 TABLET BY MOUTH TWICE A DAY WITH MEALS 06/16/22   Burns, Bobette Mo, MD  potassium chloride (KLOR-CON) 10 MEQ tablet Take 3 tablets (30 mEq total) by mouth daily. 08/18/22   Pincus Sanes, MD  tamsulosin (FLOMAX) 0.4 MG CAPS capsule Take 0.4 mg by mouth daily. Patient not taking: Reported on 09/24/2022 12/31/19   [provider]  terazosin (HYTRIN) 10 MG capsule TAKE 1 CAPSULE BY MOUTH EVERYDAY AT BEDTIME 10/22/22   Pincus Sanes, MD    Family History Family History  Problem Relation Age of Onset   Colon cancer Mother    Hypertension Mother    Hyperlipidemia Mother    Hypertension Father    Hyperlipidemia Father    Diabetes Brother     Social History Social History   Tobacco Use   Smoking status: Former    Current packs/day: 0.00    Average packs/day: 0.5 packs/day for 40.0 years (20.0 ttl pk-yrs)    Types: Cigarettes    Start date: 12/25/1981    Quit date: 12/25/2021    Years since quitting: 0.8   Smokeless tobacco: Never   Tobacco comments:    Patient reports has not smoked since he went to the hospital on 12/25/21  Vaping Use   Vaping status: Never Used  Substance Use Topics   Alcohol use: Yes    Comment: OCCASIONAL   Drug use: No     Allergies   Patient has no  known allergies.   Review of Systems Review of Systems  Constitutional:  Negative for fever.  Respiratory: Negative.    Cardiovascular: Negative.   Gastrointestinal:  Negative for abdominal pain.  Genitourinary:  Positive for frequency and urgency. Negative for hematuria, penile discharge and penile pain.       Burning on urination     Physical Exam Triage Vital Signs ED Triage Vitals [10/22/22 1240]  Encounter Vitals Group     BP (!) 136/90     Systolic BP Percentile      Diastolic BP Percentile      Pulse Rate 82     Resp 18     Temp 99 F (37.2  C)     Temp Source Oral     SpO2 94 %     Weight      Height      Head Circumference      Peak Flow      Pain Score 0     Pain Loc      Pain Education      Exclude from Growth Chart    No data found.  Updated Vital Signs BP (!) 136/90 (BP Location: Right Arm)   Pulse 82   Temp 99 F (37.2 C) (Oral)   Resp 18   SpO2 94%   Visual Acuity Right Eye Distance:   Left Eye Distance:   Bilateral Distance:    Right Eye Near:   Left Eye Near:    Bilateral Near:     Physical Exam Constitutional:      Appearance: Normal appearance.  Cardiovascular:     Rate and Rhythm: Normal rate.  Pulmonary:     Effort: Pulmonary effort is normal.  Abdominal:     General: Abdomen is flat. There is no distension.     Tenderness: There is no right CVA tenderness or left CVA tenderness.  Skin:    General: Skin is warm.  Neurological:     General: No focal deficit present.     Mental Status: He is alert.      UC Treatments / Results  Labs (all labs ordered are listed, but only abnormal results are displayed) Labs Reviewed  POCT URINALYSIS DIP (MANUAL ENTRY) - Abnormal; Notable for the following components:      Result Value   Clarity, UA cloudy (*)    Spec Grav, UA >=1.030 (*)    Blood, UA large (*)    Protein Ur, POC >=300 (*)    Leukocytes, UA Trace (*)    All other components within normal limits  URINE CULTURE     EKG   Radiology No results found.  Procedures Procedures (including critical care time)  Medications Ordered in UC Medications - No data to display  Initial Impression / Assessment and Plan / UC Course  I have reviewed the triage vital signs and the nursing notes.  Pertinent labs & imaging results that were available during my care of the patient were reviewed by me and considered in my medical decision making (see chart for details).     Patient will need to notify urologist of UTI and have follow-up urinalysis completed post antibiotic treatment Symptoms become worse he would need to be seen in the emergency room for further treatment and evaluation Stay hydrated drink plenty of fluids Take full dose of antibiotics with food Final Clinical Impressions(s) / UC Diagnoses   Final diagnoses:  Lower urinary tract infectious disease  Frequency of urination     Discharge Instructions      Patient will need to notify urologist of UTI and have follow-up urinalysis completed post antibiotic treatment Symptoms become worse he would need to be seen in the emergency room for further treatment and evaluation Stay hydrated drink plenty of fluids Take full dose of antibiotics with food     ED Prescriptions     Medication Sig Dispense Auth. Provider   cephALEXin (KEFLEX) 500 MG capsule Take 1 capsule (500 mg total) by mouth 4 (four) times daily. 20 capsule Coralyn Mark, NP      PDMP not reviewed this encounter.   Coralyn Mark, NP 10/22/22 1345

## 2022-10-22 NOTE — Discharge Instructions (Addendum)
Patient will need to notify urologist of UTI and have follow-up urinalysis completed post antibiotic treatment Symptoms become worse he would need to be seen in the emergency room for further treatment and evaluation Stay hydrated drink plenty of fluids Take full dose of antibiotics with food

## 2022-10-22 NOTE — ED Triage Notes (Signed)
Pt c/o burning on urination with incontinence, urgency and frequency. States hx of prostate CA.

## 2022-10-24 LAB — URINE CULTURE: Culture: >=100000 — AB

## 2022-10-29 DIAGNOSIS — N453 Epididymo-orchitis: Secondary | ICD-10-CM | POA: Diagnosis not present

## 2022-10-29 DIAGNOSIS — R8271 Bacteriuria: Secondary | ICD-10-CM | POA: Diagnosis not present

## 2022-11-01 NOTE — Progress Notes (Unsigned)
Virtual Visit via Telephone Note   Because of Colin Colin's co-morbid illnesses, he is at least at moderate risk for complications without adequate follow up.  This format is felt to be most appropriate for this patient at this time.  The patient did not have access to video technology/had technical difficulties with video requiring transitioning to audio format only (telephone).  All issues noted in this document were discussed and addressed.  No physical exam could be performed with this format.  Please refer to the patient's chart for his consent to telehealth for [facility].  Evaluation Performed:  Preoperative cardiovascular risk assessment _____________   Date:  11/01/2022   Patient ID:  Colin Colin, DOB 19-Oct-1945, MRN [id number] Patient Location:  Home Provider location:   Office  Primary Care Provider:  Pincus Sanes, MD Primary Cardiologist:  Colin Schultz, MD  Chief Complaint / Patient Profile   77 y.o. y/o male with a h/o persistent afib, PACs, hypertension, hyperlipidemia, GERD, T2DM, bladder cancer, prostate cancer who is pending brachytherapy with spaceoar by Dr. Marlou Colin on 11/20/2022 and presents today for telephonic preoperative cardiovascular risk assessment.  History of Present Illness    Colin Colin is a 77 y.o. male who presents via audio/video conferencing for a telehealth visit today.  Pt was last seen in cardiology clinic on 04/13/2022 by Dr. Anne Love.  At that time Colin Colin was stable from a cardiac standpoint.  The patient is now pending procedure as outlined above. Since his last visit, he ***  Past Medical History    Past Medical History:  Diagnosis Date   A-fib (HCC)    Adenoma of left adrenal gland    At risk for sleep apnea    STOP-BANG= 4    SENT TO PCP 02-06-2014   Bladder cancer (HCC) 02/06/2014   local, low grade pap uro carcinoma   BPH (benign prostatic hypertrophy)    Diverticulosis of colon    Full dentures     GERD (gastroesophageal reflux disease)    History of adenomatous polyp of colon    History of diverticulitis of colon    Hyperlipidemia    Hypertension    Murmur, cardiac    PAC (premature atrial contraction)    Type 2 diabetes mellitus (HCC)    Past Surgical History:  Procedure Laterality Date   ANTERIOR CERVICAL DECOMP/DISCECTOMY FUSION  05/24/2003   C4  --  C7   CARDIOVERSION N/A 04/24/2021   Procedure: CARDIOVERSION;  Surgeon: Vesta Mixer, MD;  Location: [facility] ENDOSCOPY;  Service: Cardiovascular;  Laterality: N/A;   COLONOSCOPY W/ POLYPECTOMY  last one 07-24-2013 w/ EGD   CYSTOSCOPY W/ RETROGRADES Bilateral 02/09/2014   Procedure: BILATERAL RETROGRADE PYELOGRAM , Cystoscopy;  Surgeon: Crist Fat, MD;  Location: [facility];  Service: Urology;  Laterality: Bilateral;   PROSTATE BIOPSY  07/27/2022   TRANSURETHRAL RESECTION OF BLADDER TUMOR WITH GYRUS (TURBT-GYRUS) N/A 02/09/2014   Procedure: Bladder Biopsies;  Surgeon: Crist Fat, MD;  Location: [facility];  Service: Urology;  Laterality: N/A;    Allergies  No Known Allergies  Home Medications    Prior to Admission medications   Medication Sig Start Date End Date Taking? Authorizing Provider  acetaminophen (TYLENOL) 500 MG tablet Take 1,000 mg by mouth every 6 (six) hours as needed for mild pain.    [provider]  atorvastatin (LIPITOR) 20 MG tablet TAKE 1 TABLET BY MOUTH ONCE DAILY AT [facility] 07/17/22  Colin Sanes, MD  cephALEXin (KEFLEX) 500 MG capsule Take 1 capsule (500 mg total) by mouth 4 (four) times daily. 10/22/22   Coralyn Mark, NP  diclofenac Sodium (VOLTAREN) 1 % GEL Apply 2 g topically daily as needed (pain).    [provider]  diltiazem (CARDIZEM CD) 180 MG 24 hr capsule TAKE 1 CAPSULE BY MOUTH TWICE A DAY 07/22/22   Burns, Bobette Mo, MD  ELIQUIS 5 MG TABS tablet TAKE 1 TABLET BY MOUTH TWICE A DAY 06/05/22   Jake Bathe, MD  gabapentin  (NEURONTIN) 100 MG capsule Take 1-2 capsules (100-200 mg total) by mouth daily as needed. Patient taking differently: Take 100-200 mg by mouth daily as needed (Pain). 02/07/19   Burns, Bobette Mo, MD  latanoprost (XALATAN) 0.005 % ophthalmic solution Place 1 drop into both eyes at bedtime.  10/17/15   [provider]  lisinopril-hydrochlorothiazide (ZESTORETIC) 20-12.5 MG tablet TAKE 1 TABLET BY MOUTH TWICE A DAY 06/08/22   Burns, Bobette Mo, MD  metFORMIN (GLUCOPHAGE) 500 MG tablet TAKE 1 TABLET BY MOUTH TWICE A DAY WITH MEALS 06/16/22   Burns, Bobette Mo, MD  potassium chloride (KLOR-CON) 10 MEQ tablet Take 3 tablets (30 mEq total) by mouth daily. 08/18/22   Colin Sanes, MD  tamsulosin (FLOMAX) 0.4 MG CAPS capsule Take 0.4 mg by mouth daily. Patient not taking: Reported on 09/24/2022 12/31/19   [provider]  terazosin (HYTRIN) 10 MG capsule TAKE 1 CAPSULE BY MOUTH EVERYDAY AT BEDTIME 10/22/22   Colin Sanes, MD    Physical Exam    Vital Signs:  Colin Colin does not have vital signs available for review today.***  Given telephonic nature of communication, physical exam is limited. AAOx3. NAD. Normal affect.  Speech and respirations are unlabored.  Accessory Clinical Findings    None  Assessment & Plan    Primary Cardiologist: Colin Schultz, MD  Preoperative cardiovascular risk assessment. Brachytherapy with spaceoar by Dr. Marlou Colin on 11/20/2022.  Chart reviewed as part of pre-operative protocol coverage. According to the RCRI, patient has a 0.4% risk of MACE. Patient reports activity equivalent to 4.0 METS (***).   Given past medical history and time since last visit, based on ACC/AHA guidelines, Colin Colin would be at acceptable risk for the planned procedure without further cardiovascular testing.   Patient was advised that if he develops new symptoms prior to surgery to contact our office to arrange a follow-up appointment.  he verbalized understanding.  Per  pharm D, may stop Eliquis for 2-3 days prior to procedure.   I will route this recommendation to the requesting party via Epic fax function.  Please call with questions.  Time:   Today, I have spent *** minutes with the patient with telehealth technology discussing medical history, symptoms, and management plan.     Colin Levering, NP  11/01/2022, 4:37 PM

## 2022-11-02 ENCOUNTER — Ambulatory Visit: Payer: Medicare HMO | Attending: Cardiology | Admitting: Student

## 2022-11-02 DIAGNOSIS — Z01810 Encounter for preprocedural cardiovascular examination: Secondary | ICD-10-CM | POA: Diagnosis not present

## 2022-11-06 DIAGNOSIS — N453 Epididymo-orchitis: Secondary | ICD-10-CM | POA: Diagnosis not present

## 2022-11-06 DIAGNOSIS — N3000 Acute cystitis without hematuria: Secondary | ICD-10-CM | POA: Diagnosis not present

## 2022-12-04 ENCOUNTER — Other Ambulatory Visit: Payer: Self-pay | Admitting: Internal Medicine

## 2022-12-30 ENCOUNTER — Encounter (HOSPITAL_BASED_OUTPATIENT_CLINIC_OR_DEPARTMENT_OTHER): Payer: Self-pay | Admitting: Urology

## 2022-12-31 ENCOUNTER — Encounter (HOSPITAL_BASED_OUTPATIENT_CLINIC_OR_DEPARTMENT_OTHER): Payer: Self-pay | Admitting: Urology

## 2023-01-01 ENCOUNTER — Encounter (HOSPITAL_BASED_OUTPATIENT_CLINIC_OR_DEPARTMENT_OTHER): Payer: Self-pay | Admitting: Urology

## 2023-01-01 NOTE — Progress Notes (Signed)
Spoke w/ via phone for pre-op interview--- pt Lab needs dos----   [employer] results------  current EKG in epic/ chart COVID test -----patient states asymptomatic no test needed Arrive at ------- 1200 on 01-06-2023 NPO after MN NO Solid Food.  Clear liquids from MN until--- 1100 Med rec completed Medications to take morning of surgery ----- cardizem, klor-con Diabetic medication ----- do not take metformin morning of surgery Patient instructed no nail polish to be worn day of surgery Patient instructed to bring photo id and insurance card day of surgery Patient aware to have Driver (ride ) / caregiver    for 24 hours after surgery - wife, jane Patient Special Instructions ----- will do one fleet enema morning of surgery Pre-Op special Instructions -----  pt has telephone cardiac clearance by Carlos Levering NP on 11-02-2022 in epic/ chart Patient verbalized understanding of instructions that were given at this phone interview. Patient denies chest pain, sob, fever, cough at the interview.    Anesthesia Review:  HTN;  CAD ;  PersistantAFIB on eliquis;  PACs;  DM2 Pt denies cardiac s&s, no sob with any acitivty, no peripheral swelling.  PCP:   Dr Juanell Fairly (lov 08-18-2022) Cardiologist :  Dr Anne Fu Theron Arista 04-13-2022) Chest x-ray : 02-12-2022 EKG : 01-07-2022 Echo : 04-14-2022 Stress test: none Cardiac Cath : none Activity level: see above Sleep Study/ CPAP :  no Fasting Blood Sugar :    120s  / Checks Blood Sugar -- times a day:  3 times per week Blood Thinner/ Instructions /Last Dose:  Eliquis ASA / Instructions/ Last Dose : no Per pt was given instructions from cardiology.  Stated last dose will be 01-02-2023

## 2023-01-05 ENCOUNTER — Telehealth: Payer: Self-pay | Admitting: *Deleted

## 2023-01-05 NOTE — Telephone Encounter (Signed)
CALLED PATIENT TO REMIND OF PROCEDURE FOR 01-06-23, SPOKE WITH PATIENT AND HE IS AWARE OF THIS PROCEDURE

## 2023-01-06 ENCOUNTER — Ambulatory Visit (HOSPITAL_BASED_OUTPATIENT_CLINIC_OR_DEPARTMENT_OTHER): Payer: Medicare HMO | Admitting: Anesthesiology

## 2023-01-06 ENCOUNTER — Ambulatory Visit (HOSPITAL_COMMUNITY): Payer: Medicare HMO

## 2023-01-06 ENCOUNTER — Ambulatory Visit (HOSPITAL_BASED_OUTPATIENT_CLINIC_OR_DEPARTMENT_OTHER)
Admission: RE | Admit: 2023-01-06 | Discharge: 2023-01-06 | Disposition: A | Discharge status: Home / Self Care | Payer: Medicare HMO | Attending: Urology | Admitting: Urology

## 2023-01-06 ENCOUNTER — Encounter (HOSPITAL_BASED_OUTPATIENT_CLINIC_OR_DEPARTMENT_OTHER): Payer: Self-pay | Admitting: Urology

## 2023-01-06 ENCOUNTER — Other Ambulatory Visit: Payer: Self-pay

## 2023-01-06 ENCOUNTER — Encounter (HOSPITAL_BASED_OUTPATIENT_CLINIC_OR_DEPARTMENT_OTHER)
Admission: RE | Disposition: A | Discharge status: Home / Self Care | Payer: Self-pay | Source: Home / Self Care | Attending: Urology

## 2023-01-06 DIAGNOSIS — C61 Malignant neoplasm of prostate: Secondary | ICD-10-CM | POA: Diagnosis present

## 2023-01-06 DIAGNOSIS — I10 Essential (primary) hypertension: Secondary | ICD-10-CM | POA: Diagnosis not present

## 2023-01-06 DIAGNOSIS — Z191 Hormone sensitive malignancy status: Secondary | ICD-10-CM | POA: Diagnosis not present

## 2023-01-06 DIAGNOSIS — Z794 Long term (current) use of insulin: Secondary | ICD-10-CM | POA: Diagnosis not present

## 2023-01-06 DIAGNOSIS — E119 Type 2 diabetes mellitus without complications: Secondary | ICD-10-CM | POA: Diagnosis not present

## 2023-01-06 DIAGNOSIS — I4891 Unspecified atrial fibrillation: Secondary | ICD-10-CM | POA: Diagnosis not present

## 2023-01-06 DIAGNOSIS — Z01818 Encounter for other preprocedural examination: Secondary | ICD-10-CM

## 2023-01-06 HISTORY — DX: Long term (current) use of anticoagulants: Z79.01

## 2023-01-06 HISTORY — PX: RADIOACTIVE SEED IMPLANT: SHX5150

## 2023-01-06 HISTORY — DX: Benign neoplasm of right adrenal gland: D35.01

## 2023-01-06 HISTORY — DX: Other persistent atrial fibrillation: I48.19

## 2023-01-06 HISTORY — DX: Unspecified glaucoma: H40.9

## 2023-01-06 HISTORY — PX: SPACE OAR INSTILLATION: SHX6769

## 2023-01-06 HISTORY — DX: Male erectile dysfunction, unspecified: N52.9

## 2023-01-06 HISTORY — DX: Polyneuropathy, unspecified: G62.9

## 2023-01-06 HISTORY — DX: Benign neoplasm of left adrenal gland: D35.02

## 2023-01-06 HISTORY — DX: Unspecified osteoarthritis, unspecified site: M19.90

## 2023-01-06 LAB — POCT I-STAT, CHEM 8
BUN: 14 mg/dL (ref 8–23)
Calcium, Ion: 1.23 mmol/L (ref 1.15–1.40)
Chloride: 103 mmol/L (ref 98–111)
Creatinine, Ser: 0.9 mg/dL (ref 0.61–1.24)
Glucose, Bld: 131 mg/dL — ABNORMAL HIGH (ref 70–99)
HCT: 44 % (ref 39.0–52.0)
Hemoglobin: 15 g/dL (ref 13.0–17.0)
Potassium: 3.5 mmol/L (ref 3.5–5.1)
Sodium: 136 mmol/L (ref 135–145)
TCO2: 20 mmol/L — ABNORMAL LOW (ref 22–32)

## 2023-01-06 LAB — GLUCOSE, CAPILLARY: Glucose-Capillary: 115 mg/dL — ABNORMAL HIGH (ref 70–99)

## 2023-01-06 SURGERY — RADIOACTIVE SEED IMPLANT/BRACHYTHERAPY IMPLANT
Anesthesia: General

## 2023-01-06 MED ORDER — PROPOFOL 10 MG/ML IV BOLUS
INTRAVENOUS | Status: AC
  Filled 2023-01-06: qty 20

## 2023-01-06 MED ORDER — IOHEXOL 300 MG/ML  SOLN
INTRAMUSCULAR | Status: DC | PRN
  Administered 2023-01-06: 7 mL

## 2023-01-06 MED ORDER — CELECOXIB 200 MG PO CAPS
200.0000 mg | ORAL_CAPSULE | Freq: Once | ORAL | Status: AC
  Administered 2023-01-06: 200 mg via ORAL

## 2023-01-06 MED ORDER — FENTANYL CITRATE (PF) 100 MCG/2ML IJ SOLN
INTRAMUSCULAR | Status: DC | PRN
  Administered 2023-01-06: 50 ug via INTRAVENOUS
  Administered 2023-01-06 (×2): 12.5 ug via INTRAVENOUS
  Administered 2023-01-06: 25 ug via INTRAVENOUS

## 2023-01-06 MED ORDER — SUGAMMADEX SODIUM 200 MG/2ML IV SOLN
INTRAVENOUS | Status: DC | PRN
  Administered 2023-01-06: 200 mg via INTRAVENOUS

## 2023-01-06 MED ORDER — ACETAMINOPHEN 500 MG PO TABS
ORAL_TABLET | ORAL | Status: AC
  Filled 2023-01-06: qty 2

## 2023-01-06 MED ORDER — TAMSULOSIN HCL 0.4 MG PO CAPS: 0.4000 mg | ORAL_CAPSULE | Freq: Every day | ORAL | 0 refills | Status: DC

## 2023-01-06 MED ORDER — LIDOCAINE HCL (PF) 2 % IJ SOLN
INTRAMUSCULAR | Status: AC
  Filled 2023-01-06: qty 5

## 2023-01-06 MED ORDER — CELECOXIB 200 MG PO CAPS
ORAL_CAPSULE | ORAL | Status: AC
  Filled 2023-01-06: qty 1

## 2023-01-06 MED ORDER — ROCURONIUM BROMIDE 10 MG/ML (PF) SYRINGE
PREFILLED_SYRINGE | INTRAVENOUS | Status: AC
  Filled 2023-01-06: qty 10

## 2023-01-06 MED ORDER — OXYCODONE HCL 5 MG PO TABS: 5.0000 mg | ORAL_TABLET | Freq: Once | ORAL | Status: DC | PRN

## 2023-01-06 MED ORDER — FENTANYL CITRATE (PF) 100 MCG/2ML IJ SOLN: 25.0000 ug | INTRAMUSCULAR | Status: DC | PRN

## 2023-01-06 MED ORDER — DEXAMETHASONE SODIUM PHOSPHATE 4 MG/ML IJ SOLN
INTRAMUSCULAR | Status: DC | PRN
  Administered 2023-01-06: 5 mg via INTRAVENOUS

## 2023-01-06 MED ORDER — TRAMADOL HCL 50 MG PO TABS: 50.0000 mg | ORAL_TABLET | Freq: Four times a day (QID) | ORAL | 0 refills | Status: DC | PRN

## 2023-01-06 MED ORDER — OXYCODONE HCL 5 MG/5ML PO SOLN: 5.0000 mg | Freq: Once | ORAL | Status: DC | PRN

## 2023-01-06 MED ORDER — LACTATED RINGERS IV SOLN: INTRAVENOUS | Status: DC

## 2023-01-06 MED ORDER — LIDOCAINE HCL (CARDIAC) PF 100 MG/5ML IV SOSY
PREFILLED_SYRINGE | INTRAVENOUS | Status: DC | PRN
  Administered 2023-01-06: 60 mg via INTRAVENOUS

## 2023-01-06 MED ORDER — ROCURONIUM BROMIDE 100 MG/10ML IV SOLN
INTRAVENOUS | Status: DC | PRN
  Administered 2023-01-06: 10 mg via INTRAVENOUS
  Administered 2023-01-06: 60 mg via INTRAVENOUS

## 2023-01-06 MED ORDER — FENTANYL CITRATE (PF) 100 MCG/2ML IJ SOLN
INTRAMUSCULAR | Status: AC
  Filled 2023-01-06: qty 2

## 2023-01-06 MED ORDER — STERILE WATER FOR IRRIGATION IR SOLN
Status: DC | PRN
  Administered 2023-01-06: 500 mL

## 2023-01-06 MED ORDER — SODIUM CHLORIDE (PF) 0.9 % IJ SOLN
INTRAMUSCULAR | Status: DC | PRN
  Administered 2023-01-06: 10 mL

## 2023-01-06 MED ORDER — SODIUM CHLORIDE 0.9 % IR SOLN
Status: DC | PRN
  Administered 2023-01-06: 250 mL via INTRAVESICAL

## 2023-01-06 MED ORDER — PROPOFOL 10 MG/ML IV BOLUS
INTRAVENOUS | Status: DC | PRN
  Administered 2023-01-06: 150 mg via INTRAVENOUS

## 2023-01-06 MED ORDER — ONDANSETRON HCL 4 MG/2ML IJ SOLN
INTRAMUSCULAR | Status: DC | PRN
  Administered 2023-01-06: 4 mg via INTRAVENOUS

## 2023-01-06 MED ORDER — CIPROFLOXACIN IN D5W 400 MG/200ML IV SOLN: 400.0000 mg | INTRAVENOUS | Status: DC

## 2023-01-06 MED ORDER — FLEET ENEMA 7-19 GM/118ML RE ENEM
1.0000 | ENEMA | Freq: Once | RECTAL | Status: DC
  Filled 2023-01-06: qty 1

## 2023-01-06 MED ORDER — FLEET ENEMA 7-19 GM/118ML RE ENEM: 1.0000 | ENEMA | Freq: Once | RECTAL | Status: DC

## 2023-01-06 MED ORDER — ACETAMINOPHEN 500 MG PO TABS
1000.0000 mg | ORAL_TABLET | Freq: Once | ORAL | Status: AC
  Administered 2023-01-06: 1000 mg via ORAL

## 2023-01-06 MED ORDER — PHENYLEPHRINE HCL (PRESSORS) 10 MG/ML IV SOLN
INTRAVENOUS | Status: DC | PRN
  Administered 2023-01-06 (×4): 80 ug via INTRAVENOUS

## 2023-01-06 MED ORDER — CIPROFLOXACIN IN D5W 400 MG/200ML IV SOLN
400.0000 mg | INTRAVENOUS | Status: AC
  Administered 2023-01-06: 400 mg via INTRAVENOUS

## 2023-01-06 MED ORDER — CIPROFLOXACIN IN D5W 400 MG/200ML IV SOLN
INTRAVENOUS | Status: AC
  Filled 2023-01-06: qty 200

## 2023-01-06 MED ORDER — ONDANSETRON HCL 4 MG/2ML IJ SOLN
INTRAMUSCULAR | Status: AC
  Filled 2023-01-06: qty 2

## 2023-01-06 SURGICAL SUPPLY — 44 items
BAG DRN RND TRDRP ANRFLXCHMBR (UROLOGICAL SUPPLIES) ×1
BAG URINE DRAIN 2000ML AR STRL (UROLOGICAL SUPPLIES) ×1
BLADE CLIPPER SENSICLIP SURGIC (BLADE) ×1
CATH FOLEY 2WAY SLVR 5CC 16FR (CATHETERS) ×1
CATH ROBINSON RED A/P 16FR (CATHETERS)
CATH ROBINSON RED A/P 20FR (CATHETERS) ×1
CLOTH BEACON ORANGE TIMEOUT ST (SAFETY) ×1
CNTNR URN SCR LID CUP LEK RST (MISCELLANEOUS) ×1
CONT SPEC 4OZ STRL OR WHT (MISCELLANEOUS) ×1
COVER BACK TABLE 60X90IN (DRAPES) ×1
COVER MAYO STAND STRL (DRAPES) ×1
DRSG TEGADERM 4X4.75 (GAUZE/BANDAGES/DRESSINGS) ×1
DRSG TEGADERM 8X12 (GAUZE/BANDAGES/DRESSINGS) ×1
GAUZE SPONGE 4X4 3PLY NS LF (GAUZE/BANDAGES/DRESSINGS) ×1
GEL ULTRASOUND 20GR AQUASONIC (MISCELLANEOUS) ×2
GLOVE BIO SURGEON STRL SZ 6.5 (GLOVE)
GLOVE BIO SURGEON STRL SZ7.5 (GLOVE) ×2
GLOVE BIO SURGEON STRL SZ8 (GLOVE)
GLOVE BIOGEL PI IND STRL 6.5 (GLOVE)
GLOVE SURG ORTHO 8.5 STRL (GLOVE) ×1
GOWN STRL REUS W/TWL XL LVL3 (GOWN DISPOSABLE) ×1
GRID BRACH TEMP 18GA 2.8X3X.75 (MISCELLANEOUS) ×1
HOLDER FOLEY CATH W/STRAP (MISCELLANEOUS) ×1
IMPLANT SPACEOAR VUE SYSTEM (Spacer) ×1 IMPLANT
IV NS 1000ML (IV SOLUTION) ×2
IV NS 1000ML BAXH (IV SOLUTION) ×2
KIT TURNOVER CYSTO (KITS) ×1
NDL BRACHY 18G 5PK (NEEDLE) ×4 IMPLANT
NDL BRACHY 18G SINGLE (NEEDLE) IMPLANT
NDL PK MORGANSTERN STABILIZ (NEEDLE) ×1 IMPLANT
NEEDLE BRACHY 18G 5PK (NEEDLE) ×4
NEEDLE BRACHY 18G SINGLE (NEEDLE)
NEEDLE PK MORGANSTERN STABILIZ (NEEDLE) ×1
PACK CYSTO (CUSTOM PROCEDURE TRAY) ×1
Quick link Seeds ×1 IMPLANT
SHEATH ULTRASOUND LF (SHEATH)
SHEATH ULTRASOUND LTX NONSTRL (SHEATH)
SLEEVE SCD COMPRESS KNEE MED (STOCKING) ×1
SUT BONE WAX W31G (SUTURE)
SYR 10ML LL (SYRINGE) ×1
TOWEL OR 17X24 6PK STRL BLUE (TOWEL DISPOSABLE) ×1
UNDERPAD 30X36 HEAVY ABSORB (UNDERPADS AND DIAPERS) ×2
WATER STERILE IRR 3000ML UROMA (IV SOLUTION)
WATER STERILE IRR 500ML POUR (IV SOLUTION) ×1

## 2023-01-06 NOTE — Op Note (Signed)
Preoperative diagnosis:  Clinical stage TI C adenocarcinoma the prostate  Postoperative diagnosis:  Same  Procedure:  #1 I-125 prostate seed implantation  #2 cystourethroscopy #3 instillation of SpaceOAR biogel  Surgeon: Berniece Salines, M.D. Radiation Oncologist: Margaretmary Dys, M.D.  Anesthesia: Gen.   Indications: Patient  was diagnosed with clinical stage TI C prostate cancer. We had extensive discussion with him about treatment options versus. He elected to proceed with seed implantation. He underwent consultation my office as well as with Dr. Margaretmary Dys. He appeared to understand the advantages disadvantages potential risks of this treatment option. Full informed consent has been obtained. The patient is had preoperative ciprofloxacin. PAS compression boots were placed.   Technique and findings: Patient was brought the operating room where he had  successful induction of general anesthesia. He was placed in lithotomy position and prepped and draped in usual manner. Appropriate surgical timeout was performed. Radiation oncology department placed a transrectal ultrasound probe anchoring stand. Foley catheter with contrast in the balloon was inserted without difficulty. Anchoring needles were placed within the prostate.  Real-time contouring of the urethra prostate and rectum were performed and the dosing parameters were established. Targeted dose was 145 gray. We then came to the operating suite suite for placement of the needles. A second timeout was performed. All needle passage was done with real-time transrectal ultrasound guidance with the sagittal plane. A total of 16 needles were placed.  64 active seeds were implanted.  The brachytherapy template was then removed.    A site in the midline was selected on the perineum for placement of an 18 g needle with saline.  The needle was advanced above the rectum and below Denonvillier's fascia to the mid gland and confirmed to be in the  midline on transverse imaging.  One cc of saline was injected confirming appropriate expansion of this space.  A total of 5 cc of saline was then injected to open the space further bilaterally.  The saline syringe was then removed and the SpaceOAR hydrogel was injected with good distribution bilaterally.A Foley catheter was removed and flexible cystoscopy failed to show any seeds outside the prostate.

## 2023-01-06 NOTE — Anesthesia Procedure Notes (Signed)
Procedure Name: Intubation Date/Time: 01/06/2023 2:25 PM  Performed by: Jessica Priest, CRNAPre-anesthesia Checklist: Patient identified, Emergency Drugs available, Suction available, Patient being monitored and Timeout performed Patient Re-evaluated:Patient Re-evaluated prior to induction Oxygen Delivery Method: Circle system utilized Preoxygenation: Pre-oxygenation with 100% oxygen Induction Type: IV induction Ventilation: Mask ventilation without difficulty Laryngoscope Size: Mac and 4 Grade View: Grade II Tube type: Oral Tube size: 7.5 mm Number of attempts: 1 Airway Equipment and Method: Stylet and Oral airway Placement Confirmation: ETT inserted through vocal cords under direct vision, positive ETCO2, breath sounds checked- equal and bilateral and CO2 detector Secured at: 23 cm Tube secured with: Tape Dental Injury: Teeth and Oropharynx as per pre-operative assessment

## 2023-01-06 NOTE — Anesthesia Postprocedure Evaluation (Signed)
Anesthesia Post Note  Patient: Colin Love  Procedure(s) Performed: RADIOACTIVE SEED IMPLANT/BRACHYTHERAPY IMPLANT SPACE OAR INSTILLATION     Patient location during evaluation: PACU Anesthesia Type: General Level of consciousness: awake and alert Pain management: pain level controlled Vital Signs Assessment: post-procedure vital signs reviewed and stable Respiratory status: spontaneous breathing, nonlabored ventilation, respiratory function stable and patient connected to nasal cannula oxygen Cardiovascular status: blood pressure returned to baseline and stable Postop Assessment: no apparent nausea or vomiting Anesthetic complications: no   No notable events documented.  Last Vitals:  Vitals:   01/06/23 1200 01/06/23 1533  BP: 128/73 118/72  Pulse: (!) 52   Resp: 16 (!) 21  Temp: 36.5 C (!) 36.2 C  SpO2: 99% 100%    Last Pain:  Vitals:   01/06/23 1533  TempSrc:   PainSc: 0-No pain                 Earl Lites P Christyan Reger

## 2023-01-06 NOTE — Progress Notes (Signed)
Radiation Oncology         [phone] ________________________________  Name: XIONG COUTEE MRN: [id number]  Date: 01/06/2023  DOB: [birth year]       Prostate Seed Implant  KG:MWNUU, Bobette Mo, MD  No ref. provider found  DIAGNOSIS:  77 y.o. gentleman with Stage T1c adenocarcinoma of the prostate with Gleason score of 4+3, and PSA of 10.0.   Oncology History  Prostate cancer (HCC)  07/27/2022 Cancer Staging   Staging form: Prostate, AJCC 8th Edition - Clinical stage from 07/27/2022: Stage IIC (cT1c, cN0, cM0, PSA: 10, Grade Group: 3) - Signed by Marcello Fennel, PA-C on 09/24/2022 Histopathologic type: Adenocarcinoma, NOS Stage prefix: Initial diagnosis Prostate specific antigen (PSA) range: 10 to 19 Gleason primary pattern: 4 Gleason secondary pattern: 3 Gleason score: 7 Histologic grading system: 5 grade system Number of biopsy cores examined: 15 Number of biopsy cores positive: 15 Location of positive needle core biopsies: Both sides   08/18/2022 Initial Diagnosis   Prostate cancer (HCC)       ICD-10-CM   1. Pre-op testing  Z01.818 CBG per Guidelines for Diabetes Management for Patients Undergoing Surgery ([facility], [facility], and [facility] only)    CBG per protocol    I-Stat, Chem 8 on day of surgery per protocol    CBG per Guidelines for Diabetes Management for Patients Undergoing Surgery ([facility], [facility], and [facility] only)    CBG per protocol    I-Stat, Chem 8 on day of surgery per protocol    2. Prostate cancer [facility])  C61 Discharge patient      PROCEDURE: Insertion of radioactive I-125 seeds into the prostate gland.  RADIATION DOSE: 145 Gy, definitive therapy.  TECHNIQUE: LATERRENCE BENNIGHT was brought to the operating room with the urologist. He was placed in the dorsolithotomy position. He was catheterized and a rectal tube was inserted. The perineum was shaved, prepped and draped. The ultrasound probe was then introduced by me into the rectum to see the prostate gland.  TREATMENT DEVICE: I  attached the needle grid to the ultrasound probe stand and anchor needles were placed.  3D PLANNING: The prostate was imaged in 3D using a sagittal sweep of the prostate probe. These images were transferred to the planning computer. There, the prostate, urethra and rectum were defined on each axial reconstructed image. Then, the software created an optimized 3D plan and a few seed positions were adjusted. The quality of the plan was reviewed using [facility] information for the target and the following two organs at risk:  Urethra and Rectum.  Then the accepted plan was printed and handed off to the radiation therapist.  Under my supervision, the custom loading of the seeds and spacers was carried out using the quick loader.  These pre-loaded needles were then placed into the needle holder.Marland Kitchen  PROSTATE VOLUME STUDY:  Using transrectal ultrasound the volume of the prostate was verified to be 34 cc.  SPECIAL TREATMENT PROCEDURE/SUPERVISION AND HANDLING: The pre-loaded needles were then delivered by the urologist under sagittal guidance. A total of 16 needles were used to deposit 64 seeds in the prostate gland. The individual seed activity was 0.405 mCi.  SpaceOAR:  Yes  COMPLEX SIMULATION: At the end of the procedure, an anterior radiograph of the pelvis was obtained to document seed positioning and count. Cystoscopy was performed by the urologist to check the urethra and bladder.  MICRODOSIMETRY: At the end of the procedure, the patient was emitting 0.069 mR/hr at 1 meter. Accordingly, he was  considered safe for hospital discharge.  PLAN: The patient will return to the radiation oncology clinic for post implant CT dosimetry in three weeks.   ________________________________  Artist Pais Kathrynn Running, M.D.

## 2023-01-06 NOTE — Discharge Instructions (Addendum)
DISCHARGE INSTRUCTIONS FOR PROSTATE SEED IMPLANTATION  Diet Resume your usual diet when you return home. To keep your bowels moving easily and softly, drink prune, apple and cranberry juice at room temperature. You may also take a stool softener, such as Colace, which is available without prescription at local pharmacies. Daily activities No driving or heavy lifting for at least two days after the implant. No bike riding, horseback riding or riding lawn mowers for the first month after the implant. Any strenuous physical activity should be approved by your doctor before you resume it. Sexual relations You may resume sexual relations two weeks after the procedure. A condom should be used for the first two weeks. Your semen may be dark brown or black; this is normal and is related bleeding that may have occurred during the implant. Postoperative swelling Expect swelling and bruising of the scrotum and perineum (the area between the scrotum and anus). Both the swelling and the bruising should resolve in l or 2 weeks. Ice packs and over- the-counter medications such as Tylenol, Advil or Aleve may lessen your discomfort. Postoperative urination Most men experience burning on urination and/or urinary frequency. If this becomes bothersome, contact your Urologist.  Medication can be prescribed to relieve these problems.  It is normal to have some blood in your urine for a few days after the implant. Special instructions related to the seeds It is unlikely that you will pass an Iodine-125 seed in your urine. The seeds are silver in color and are about as large as a grain of rice. If you pass a seed, do not handle it with your fingers. Use a spoon to place it in an envelope or jar in place this in base occluded area such as the garage or basement for return to the radiation clinic at your convenience.  Contact your doctor for Temperature greater than 101 F Increasing pain Inability to urinate Follow-up   You should have follow up with your urologist and radiation oncologist about 3 weeks after the procedure. General information regarding Iodine seeds Iodine-125 is a low energy radioactive material. It is not deeply penetrating and loses energy at short distances. Your prostate will absorb the radiation. Objects that are touched or used by the patient do not become radioactive. Body wastes (urine and stool) or body fluids (saliva, tears, semen or blood) are not radioactive. The Nuclear Regulatory Commission [facility]) has determined that no radiation precautions are needed for patients undergoing Iodine-125 seed implantation. The [facility] states that such patients do not present a risk to the people around them, including young children and pregnant women. However, in keeping with the general principle that radiation exposure should be kept as low reasonably possible, we suggest the following: Children and pets should not sit on the patient's lap for the first two (2) weeks after the implant. Pregnant (or possibly pregnant) women should avoid prolonged, close contact with the patient for the first two (2) weeks after the implant. A distance of three (3) feet is acceptable. At a distance of three (3) feet, there is no limit to the length of time anyone can be with the patient.    May take tylenol after 7 PM if needed May take motrin, advil, aleve, ibuprofen or Nsaid after 7 pm if needed   Post Anesthesia Home Care Instructions  Activity: Get plenty of rest for the remainder of the day. A responsible individual must stay with you for 24 hours following the procedure.  For the next 24 hours,  DO NOT: -Drive a car -Advertising copywriter -Drink alcoholic beverages -Take any medication unless instructed by your physician -Make any legal decisions or sign important papers.  Meals: Start with liquid foods such as gelatin or soup. Progress to regular foods as tolerated. Avoid greasy, spicy, heavy foods. If nausea  and/or vomiting occur, drink only clear liquids until the nausea and/or vomiting subsides. Call your physician if vomiting continues.  Special Instructions/Symptoms: Your throat may feel dry or sore from the anesthesia or the breathing tube placed in your throat during surgery. If this causes discomfort, gargle with warm salt water. The discomfort should disappear within 24 hours.  If you had a scopolamine patch placed behind your ear for the management of post- operative nausea and/or vomiting:  1. The medication in the patch is effective for 72 hours, after which it should be removed.  Wrap patch in a tissue and discard in the trash. Wash hands thoroughly with soap and water. 2. You may remove the patch earlier than 72 hours if you experience unpleasant side effects which may include dry mouth, dizziness or visual disturbances. 3. Avoid touching the patch. Wash your hands with soap and water after contact with the patch.

## 2023-01-06 NOTE — Interval H&P Note (Signed)
History and Physical Interval Note:  01/06/2023 1:58 PM  Colin Love  has presented today for surgery, with the diagnosis of PROSTATE CANCER.  The various methods of treatment have been discussed with the patient and family. After consideration of risks, benefits and other options for treatment, the patient has consented to  Procedure(s): RADIOACTIVE SEED IMPLANT/BRACHYTHERAPY IMPLANT (N/A) SPACE OAR INSTILLATION (N/A) as a surgical intervention.  The patient's history has been reviewed, patient examined, no change in status, stable for surgery.  I have reviewed the patient's chart and labs.  Questions were answered to the patient's satisfaction.     Crist Fat

## 2023-01-06 NOTE — Transfer of Care (Signed)
Immediate Anesthesia Transfer of Care Note  Patient: PENN ALKINS  Procedure(s) Performed: Procedure(s) (LRB): RADIOACTIVE SEED IMPLANT/BRACHYTHERAPY IMPLANT (N/A) SPACE OAR INSTILLATION (N/A)  Patient Location: PACU  Anesthesia Type: GA  Level of Consciousness: awake, sedated, patient cooperative and responds to stimulation  Airway & Oxygen Therapy: Patient Spontanous Breathing and Patient connected to [place] oxygen  Post-op Assessment: Report given to PACU RN, Post -op Vital signs reviewed and stable and Patient moving all extremities  Post vital signs: Reviewed and stable  Complications: No apparent anesthesia complications

## 2023-01-06 NOTE — Anesthesia Preprocedure Evaluation (Signed)
Anesthesia Evaluation  Patient identified by MRN, date of birth, ID band Patient awake    Reviewed: Allergy & Precautions, NPO status , Patient's Chart, lab work & pertinent test results  Airway Mallampati: II  TM Distance: >3 FB Neck ROM: Full    Dental no notable dental hx.    Pulmonary neg pulmonary ROS, former smoker   Pulmonary exam normal        Cardiovascular hypertension, Pt. on medications + dysrhythmias Atrial Fibrillation  Rhythm:Irregular Rate:Normal     Neuro/Psych negative neurological ROS  negative psych ROS   GI/Hepatic Neg liver ROS,GERD  ,,  Endo/Other  diabetes, Type 2, Oral Hypoglycemic Agents    Renal/GU negative Renal ROS   Prostate Ca    Musculoskeletal  (+) Arthritis , Osteoarthritis,    Abdominal Normal abdominal exam  (+)   Peds  Hematology Lab Results      Component                Value               Date                      WBC                      6.3                 08/18/2022                HGB                      15.0                08/18/2022                HCT                      46.0                08/18/2022                MCV                      87.4                08/18/2022                PLT                      166.0               08/18/2022             Lab Results      Component                Value               Date                      NA                       139                 08/18/2022                K  4.3                 08/18/2022                CO2                      29                  08/18/2022                GLUCOSE                  119 (H)             08/18/2022                BUN                      16                  08/18/2022                CREATININE               1.07                08/18/2022                CALCIUM                  10.6 (H)            08/18/2022                GFR                      66.85                08/18/2022                GFRNONAA                 >60                 12/27/2021              Anesthesia Other Findings   Reproductive/Obstetrics                             Anesthesia Physical Anesthesia Plan  ASA: 3  Anesthesia Plan: General   Post-op Pain Management:    Induction: Intravenous  PONV Risk Score and Plan: 2 and Ondansetron, Dexamethasone and Treatment may vary due to age or medical condition  Airway Management Planned: Mask and LMA  Additional Equipment: None  Intra-op Plan:   Post-operative Plan: Extubation in OR  Informed Consent: I have reviewed the patients History and Physical, chart, labs and discussed the procedure including the risks, benefits and alternatives for the proposed anesthesia with the patient or authorized representative who has indicated his/her understanding and acceptance.     Dental advisory given  Plan Discussed with: CRNA  Anesthesia Plan Comments:        Anesthesia Quick Evaluation
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
## 2023-01-07 ENCOUNTER — Encounter (HOSPITAL_BASED_OUTPATIENT_CLINIC_OR_DEPARTMENT_OTHER): Payer: Self-pay | Admitting: Urology

## 2023-01-08 ENCOUNTER — Other Ambulatory Visit: Payer: Self-pay | Admitting: Cardiology

## 2023-01-08 ENCOUNTER — Other Ambulatory Visit: Payer: Self-pay | Admitting: Internal Medicine

## 2023-01-08 DIAGNOSIS — I4819 Other persistent atrial fibrillation: Secondary | ICD-10-CM

## 2023-01-08 NOTE — Progress Notes (Signed)
Patient was a RadOnc Consult on 08/31/22 for his stage T1c adenocarcinoma of the prostate with Gleason score of 4+3, and PSA of 10.0.  Patient proceed with treatment recommendations of brachytherapy and had his treatment on 01/06/23.   Patient is scheduled for a post seed CT Simulation on 11/20 followed by post op visit at [facility] after on 11/20.   RN spoke with patient and provided education on post treatment PSA monitoring.   Patient reports doing well since brachytherapy, minimal pain that is being controlled with Tylenol.  All questions answered.  No additional needs at this time.

## 2023-01-08 NOTE — Telephone Encounter (Signed)
Prescription refill request for Eliquis received. Indication:afib Last office visit:8/24 Scr:0.90  10/24 Age: 77 Weight:88.3  kg  Prescription refilled

## 2023-01-26 ENCOUNTER — Telehealth: Payer: Self-pay | Admitting: *Deleted

## 2023-01-26 NOTE — Telephone Encounter (Signed)
Called patient to remind of post seed appts. for 01-27-23, spoke with patient and he is aware of these appts.

## 2023-01-26 NOTE — Progress Notes (Signed)
Radiation Oncology         [phone] ________________________________  Name: Colin Love MRN: [id number]  Date: 01/27/2023  DOB: [birth year]  Post-Seed Follow-Up Visit Note  CC: Pincus Sanes, MD  Crist Fat, MD  Diagnosis:   77 y.o. gentleman with Stage T1c adenocarcinoma of the prostate with Gleason score of 4+3, and PSA of 10.0.     ICD-10-CM   1. Prostate cancer (HCC)  C61       Interval Since Last Radiation:  3 weeks 01/06/23:  Insertion of radioactive I-125 seeds into the prostate gland; 145 Gy, definitive therapy with placement of SpaceOAR gel.  Narrative:  The patient returns today for routine follow-up.  He is complaining of increased urinary frequency, a slower flow of stream and urinary hesitation symptoms. He filled out a questionnaire regarding urinary function today providing and overall IPSS score of 11 characterizing his symptoms as moderate.  His pre-implant score was 10, on Flomax daily. He has continued taking Flomax daily as prescribed and feels like his LUTS are gradually improving and tolerable. He specifically denies dysuria, gross hematuria, straining to void, or incontinence.  Denies any abdominal pain or bowel symptoms.  His energy level is only minimally decreased and overall, he is quite pleased with his progress to date.  ALLERGIES:  has No Known Allergies.  Meds: Current Outpatient Medications  Medication Sig Dispense Refill   acetaminophen (TYLENOL) 500 MG tablet Take 1,000 mg by mouth every 6 (six) hours as needed for mild pain.     atorvastatin (LIPITOR) 20 MG tablet TAKE 1 TABLET BY MOUTH ONCE DAILY AT 6PM (Patient taking differently: Take 20 mg by mouth at bedtime.) 90 tablet 3   calcium carbonate (TUMS - DOSED IN MG ELEMENTAL CALCIUM) 500 MG chewable tablet Chew 1 tablet by mouth as needed for indigestion or heartburn.     diclofenac Sodium (VOLTAREN) 1 % GEL Apply 2 g topically 4 (four) times daily as needed (pain).     diltiazem  (CARDIZEM CD) 180 MG 24 hr capsule TAKE 1 CAPSULE BY MOUTH TWICE A DAY (Patient taking differently: Take 180 mg by mouth 2 (two) times daily.) 180 capsule 3   ELIQUIS 5 MG TABS tablet TAKE 1 TABLET BY MOUTH TWICE A DAY 60 tablet 5   gabapentin (NEURONTIN) 100 MG capsule Take 1-2 capsules (100-200 mg total) by mouth daily as needed. (Patient taking differently: Take 100 mg by mouth daily as needed (Pain).) 90 capsule 3   latanoprost (XALATAN) 0.005 % ophthalmic solution Place 1 drop into both eyes at bedtime.     lisinopril-hydrochlorothiazide (ZESTORETIC) 20-12.5 MG tablet TAKE 1 TABLET BY MOUTH TWICE A DAY (Patient taking differently: Take 1 tablet by mouth 2 (two) times daily.) 180 tablet 3   metFORMIN (GLUCOPHAGE) 500 MG tablet TAKE 1 TABLET BY MOUTH TWICE A DAY WITH FOOD (Patient taking differently: Take 500 mg by mouth 2 (two) times daily with a meal. TAKE 1 TABLET BY MOUTH TWICE A DAY WITH FOOD) 180 tablet 1   potassium chloride (KLOR-CON) 10 MEQ tablet TAKE 3 TABLETS BY MOUTH DAILY. 270 tablet 1   sildenafil (VIAGRA) 100 MG tablet Take 100 mg by mouth daily as needed for erectile dysfunction.     tamsulosin (FLOMAX) 0.4 MG CAPS capsule Take 0.4 mg by mouth at bedtime.     tamsulosin (FLOMAX) 0.4 MG CAPS capsule Take 1 capsule (0.4 mg total) by mouth daily. 7 capsule 0   terazosin (HYTRIN) 10 MG  capsule TAKE 1 CAPSULE BY MOUTH EVERYDAY AT BEDTIME (Patient taking differently: Take 10 mg by mouth daily.) 90 capsule 1   traMADol (ULTRAM) 50 MG tablet Take 1-2 tablets (50-100 mg total) by mouth every 6 (six) hours as needed for moderate pain (pain score 4-6). 15 tablet 0   No current facility-administered medications for this visit.    Physical Findings: In general this is a well appearing African American male in no acute distress. He's alert and oriented x4 and appropriate throughout the examination. Cardiopulmonary assessment is negative for acute distress and he exhibits normal effort.   Lab  Findings: Lab Results  Component Value Date   WBC 6.3 08/18/2022   HGB 15.0 01/06/2023   HCT 44.0 01/06/2023   MCV 87.4 08/18/2022   PLT 166.0 08/18/2022    Radiographic Findings:  Patient underwent CT imaging in our clinic for post implant dosimetry. The CT will be reviewed by Dr. Kathrynn Running to confirm there is an adequate distribution of radioactive seeds throughout the prostate gland and ensure that there are no seeds in or near the rectum.  We suspect the final radiation plan and dosimetry will show appropriate coverage of the prostate gland. He understands that we will call and inform him of any unexpected findings on further review of his imaging and dosimetry.  Impression/Plan: 77 y.o. gentleman with Stage T1c adenocarcinoma of the prostate with Gleason score of 4+3, and PSA of 10.0.  The patient is recovering from the effects of radiation. His urinary symptoms should gradually improve over the next 4-6 months. We talked about this today. He is encouraged by his improvement already and is otherwise pleased with his outcome. We also talked about long-term follow-up for prostate cancer following seed implant. He understands that ongoing PSA determinations and digital rectal exams will help perform surveillance to rule out disease recurrence. He has a follow up appointment scheduled with Anne Fu, NP this morning at 10:30am. He understands what to expect with his PSA measures. Patient was also educated today about some of the long-term effects from radiation including a small risk for rectal bleeding and possibly erectile dysfunction. We talked about some of the general management approaches to these potential complications. However, I did encourage the patient to contact our office or return at any point if he has questions or concerns related to his previous radiation and prostate cancer.    Marguarite Arbour, PA-C

## 2023-01-26 NOTE — Progress Notes (Signed)
  Radiation Oncology         [phone] ________________________________  Name: Colin Love MRN: [id number]  Date: 01/27/2023  DOB: 01/17/1946  COMPLEX SIMULATION NOTE  NARRATIVE:  The patient was brought to the CT Simulation planning suite today following prostate seed implantation approximately one month ago.  Identity was confirmed.  All relevant records and images related to the planned course of therapy were reviewed.  Then, the patient was set-up supine.  CT images were obtained.  The CT images were loaded into the planning software.  Then the prostate and rectum were contoured.  Treatment planning then occurred.  The implanted iodine 125 seeds were identified by the physics staff for projection of radiation distribution  I have requested : 3D Simulation  I have requested a DVH of the following structures: Prostate and rectum.    ________________________________  Artist Pais Kathrynn Running, M.D.

## 2023-01-27 ENCOUNTER — Ambulatory Visit
Admission: RE | Admit: 2023-01-27 | Discharge: 2023-01-27 | Disposition: A | Discharge status: Home / Self Care | Payer: Medicare HMO | Source: Ambulatory Visit | Attending: Radiation Oncology | Admitting: Radiation Oncology

## 2023-01-27 ENCOUNTER — Encounter: Payer: Self-pay | Admitting: Urology

## 2023-01-27 ENCOUNTER — Ambulatory Visit
Admission: RE | Admit: 2023-01-27 | Discharge: 2023-01-27 | Disposition: A | Discharge status: Home / Self Care | Payer: Medicare HMO | Source: Ambulatory Visit | Attending: Urology | Admitting: Urology

## 2023-01-27 VITALS — BP 117/76 | HR 82 | Temp 97.5°F | Resp 18 | Ht 67.0 in | Wt 195.0 lb

## 2023-01-27 DIAGNOSIS — Z7984 Long term (current) use of oral hypoglycemic drugs: Secondary | ICD-10-CM | POA: Insufficient documentation

## 2023-01-27 DIAGNOSIS — Z923 Personal history of irradiation: Secondary | ICD-10-CM | POA: Insufficient documentation

## 2023-01-27 DIAGNOSIS — C61 Malignant neoplasm of prostate: Secondary | ICD-10-CM | POA: Diagnosis present

## 2023-01-27 DIAGNOSIS — Z7901 Long term (current) use of anticoagulants: Secondary | ICD-10-CM | POA: Insufficient documentation

## 2023-01-27 DIAGNOSIS — Z79899 Other long term (current) drug therapy: Secondary | ICD-10-CM | POA: Insufficient documentation

## 2023-01-27 NOTE — Progress Notes (Signed)
Post-seed nursing interview for a diagnosis of Stage T1c adenocarcinoma of the prostate with Gleason score of 4+3, and PSA of 10.0. Marland Kitchen  Patient identity verified x2.   Patient reports dysuria 2/10, stream weakness, . Patient denies all other related issues at this time.  Meaningful use complete.  I-PSS score- 11 - Moderate SHIM score- 13 Urinary Management medication(s) Tamsulosin Urology appointment date- 01/27/2023 with Dr. Marlou Porch at [facility]  Vitals- BP 117/76 (BP Location: Left Arm, Patient Position: Sitting, Cuff Size: Normal)   Pulse 82   Temp (!) 97.5 F (36.4 C) (Temporal)   Resp 18   Ht 5\' 7"  (1.702 m)   Wt 195 lb (88.5 kg)   BMI 30.54 kg/m   This concludes the interaction.  Ruel Favors, LPN

## 2023-01-28 ENCOUNTER — Encounter: Payer: Self-pay | Admitting: Radiation Oncology

## 2023-01-28 DIAGNOSIS — C61 Malignant neoplasm of prostate: Secondary | ICD-10-CM | POA: Diagnosis not present

## 2023-01-28 DIAGNOSIS — Z191 Hormone sensitive malignancy status: Secondary | ICD-10-CM | POA: Diagnosis not present

## 2023-01-28 DIAGNOSIS — Z79899 Other long term (current) drug therapy: Secondary | ICD-10-CM | POA: Diagnosis not present

## 2023-01-28 DIAGNOSIS — Z7901 Long term (current) use of anticoagulants: Secondary | ICD-10-CM | POA: Diagnosis not present

## 2023-01-28 DIAGNOSIS — Z7984 Long term (current) use of oral hypoglycemic drugs: Secondary | ICD-10-CM | POA: Diagnosis not present

## 2023-01-28 DIAGNOSIS — Z923 Personal history of irradiation: Secondary | ICD-10-CM | POA: Diagnosis not present

## 2023-01-28 NOTE — Progress Notes (Signed)
  Radiation Oncology         [phone] ________________________________  Name: RANDOLL BIR MRN: [id number]  Date: 01/28/2023  DOB: 1946-01-07  3D Planning Note   Prostate Brachytherapy Post-Implant Dosimetry  Diagnosis: 77 y.o. gentleman with Stage T1c adenocarcinoma of the prostate with Gleason score of 4+3, and PSA of 10.0.   Narrative: On a previous date, NGAI KONIGSBERG returned following prostate seed implantation for post implant planning. He underwent CT scan complex simulation to delineate the three-dimensional structures of the pelvis and demonstrate the radiation distribution.  Since that time, the seed localization, and complex isodose planning with dose volume histograms have now been completed.  Results:   Prostate Coverage - The dose of radiation delivered to the 90% or more of the prostate gland (D90) was 127.32% of the prescription dose. This exceeds our goal of greater than 90%. Rectal Sparing - The volume of rectal tissue receiving the prescription dose or higher was 0.0 cc. This falls under our thresholds tolerance of 1.0 cc.  Impression: The prostate seed implant appears to show adequate target coverage and appropriate rectal sparing.  Plan:  The patient will continue to follow with urology for ongoing PSA determinations. I would anticipate a high likelihood for local tumor control with minimal risk for rectal morbidity.  ________________________________  Artist Pais Kathrynn Running, M.D.

## 2023-01-28 NOTE — Radiation Completion Notes (Addendum)
  Radiation Oncology         [phone] ________________________________  Name: Colin Love MRN: [id number]  Date: 01/28/2023  DOB: 04-30-1945  Referring Physician: BENJAMIN HERRICK, M.D. Date of Service: 2023-01-28 Radiation Oncologist: Adina Barge, M.D. [facility]     RADIATION ONCOLOGY END OF TREATMENT NOTE     Diagnosis: C61 Malignant neoplasm of prostate Staging on 2022-07-27: Prostate cancer (HCC) T=cT1c, N=cN0, M=cM0 Intent: Curative     ==========DELIVERED PLANS==========  Prostate Seed Implant Date: 2023-01-06   Plan Name: Prostate Seed Implant Site: Prostate Technique: Radioactive Seed Implant I-125 Mode: Brachytherapy Dose Per Fraction: 145 Gy Prescribed Dose (Delivered / Prescribed): 145 Gy / 145 Gy Prescribed Fxs (Delivered / Prescribed): 1 / 1     ==========ON TREATMENT VISIT DATES========== 2023-01-06     The patient will receive a call in about one month from the radiation oncology department. He will continue follow up with Dr. Cam as well. ------------------------------------------------   Donnice Barge, MD [facility]  Radiation Oncology Direct Dial: [phone]  Fax: [phone] [email or web address]  Skype  LinkedIn

## 2023-01-29 ENCOUNTER — Encounter: Payer: Self-pay | Admitting: *Deleted

## 2023-02-25 ENCOUNTER — Inpatient Hospital Stay: Payer: Medicare HMO | Attending: Adult Health | Admitting: *Deleted

## 2023-02-25 ENCOUNTER — Encounter: Payer: Self-pay | Admitting: *Deleted

## 2023-02-25 DIAGNOSIS — C61 Malignant neoplasm of prostate: Secondary | ICD-10-CM

## 2023-02-25 NOTE — Progress Notes (Signed)
SCP reviewed and completed. Last colonoscopy was 11/2018. This was his final colonoscopy. Pt will see Dr. Marlou Porch and get post-tx PSA labs at [facility] in January,2025.

## 2023-03-18 DIAGNOSIS — Z833 Family history of diabetes mellitus: Secondary | ICD-10-CM | POA: Diagnosis not present

## 2023-03-18 DIAGNOSIS — E119 Type 2 diabetes mellitus without complications: Secondary | ICD-10-CM | POA: Diagnosis not present

## 2023-03-18 DIAGNOSIS — D6869 Other thrombophilia: Secondary | ICD-10-CM | POA: Diagnosis not present

## 2023-03-18 DIAGNOSIS — N529 Male erectile dysfunction, unspecified: Secondary | ICD-10-CM | POA: Diagnosis not present

## 2023-03-18 DIAGNOSIS — M1990 Unspecified osteoarthritis, unspecified site: Secondary | ICD-10-CM | POA: Diagnosis not present

## 2023-03-18 DIAGNOSIS — I4891 Unspecified atrial fibrillation: Secondary | ICD-10-CM | POA: Diagnosis not present

## 2023-03-18 DIAGNOSIS — Z87891 Personal history of nicotine dependence: Secondary | ICD-10-CM | POA: Diagnosis not present

## 2023-03-18 DIAGNOSIS — Z8249 Family history of ischemic heart disease and other diseases of the circulatory system: Secondary | ICD-10-CM | POA: Diagnosis not present

## 2023-03-18 DIAGNOSIS — E785 Hyperlipidemia, unspecified: Secondary | ICD-10-CM | POA: Diagnosis not present

## 2023-03-18 DIAGNOSIS — I10 Essential (primary) hypertension: Secondary | ICD-10-CM | POA: Diagnosis not present

## 2023-03-18 DIAGNOSIS — Z809 Family history of malignant neoplasm, unspecified: Secondary | ICD-10-CM | POA: Diagnosis not present

## 2023-03-18 DIAGNOSIS — E669 Obesity, unspecified: Secondary | ICD-10-CM | POA: Diagnosis not present

## 2023-03-29 ENCOUNTER — Other Ambulatory Visit: Payer: Self-pay | Admitting: Internal Medicine

## 2023-04-16 ENCOUNTER — Telehealth: Payer: Self-pay

## 2023-04-16 NOTE — Telephone Encounter (Signed)
 Copied from CRM [id number]. Topic: Appointments - Appointment Scheduling >> Apr 16, 2023  4:37 PM Tiffany H wrote: Patient/patient representative is calling to schedule an appointment. Refer to attachments for appointment information.   Patient advised that he's had more frequent urination within the last month. At home healthcare nurse advised that his kidney labs came back as abnormal. No copy is listed in chart.   Scheduled patient for 04/28/23 appointment due to no acute symptoms. Please follow up with patient if labs need to be sent to Dr. Geofm.

## 2023-04-19 NOTE — Telephone Encounter (Signed)
 Patient has appointment on 04/28/23.

## 2023-04-26 ENCOUNTER — Ambulatory Visit: Payer: Medicare HMO | Attending: Cardiology | Admitting: Cardiology

## 2023-04-26 ENCOUNTER — Ambulatory Visit (INDEPENDENT_AMBULATORY_CARE_PROVIDER_SITE_OTHER): Payer: Medicare HMO

## 2023-04-26 ENCOUNTER — Encounter: Payer: Self-pay | Admitting: Cardiology

## 2023-04-26 VITALS — BP 124/80 | HR 72 | Ht 67.0 in | Wt 198.6 lb

## 2023-04-26 DIAGNOSIS — R42 Dizziness and giddiness: Secondary | ICD-10-CM

## 2023-04-26 DIAGNOSIS — I4819 Other persistent atrial fibrillation: Secondary | ICD-10-CM | POA: Diagnosis not present

## 2023-04-26 NOTE — Patient Instructions (Signed)
 Medication Instructions:  The current medical regimen is effective;  continue present plan and medications.  *If you need a refill on your cardiac medications before your next appointment, please call your pharmacy*  Testing/Procedures: ZIO XT- Long Term Monitor Instructions  Your physician has requested you wear a ZIO patch monitor for 14 days.  This is a single patch monitor. [employer] supplies one patch monitor per enrollment. Additional stickers are not available. Please do not apply patch if you will be having a Nuclear Stress Test,  Echocardiogram, Cardiac CT, MRI, or Chest Xray during the period you would be wearing the  monitor. The patch cannot be worn during these tests. You cannot remove and re-apply the  ZIO XT patch monitor.  Your ZIO patch monitor will be mailed 3 day USPS to your address on file. It may take 3-5 days  to receive your monitor after you have been enrolled.  Once you have received your monitor, please review the enclosed instructions. Your monitor  has already been registered assigning a specific monitor serial # to you.  Billing and Patient Assistance Program Information  We have supplied [employer] with any of your insurance information on file for billing purposes. [employer] offers a sliding scale Patient Assistance Program for patients that do not have  insurance, or whose insurance does not completely cover the cost of the ZIO monitor.  You must apply for the Patient Assistance Program to qualify for this discounted rate.  To apply, please call [employer] at [phone], select option 4, select option 2, ask to apply for  Patient Assistance Program. Meredeth Ide will ask your household income, and how many people  are in your household. They will quote your out-of-pocket cost based on that information.  [employer] will also be able to set up a 27-month, interest-free payment plan if needed.  Applying the monitor   Shave hair from upper left chest.  Hold abrader disc  by orange tab. Rub abrader in 40 strokes over the upper left chest as  indicated in your monitor instructions.  Clean area with 4 enclosed alcohol pads. Let dry.  Apply patch as indicated in monitor instructions. Patch will be placed under collarbone on left  side of chest with arrow pointing upward.  Rub patch adhesive wings for 2 minutes. Remove white label marked "1". Remove the white  label marked "2". Rub patch adhesive wings for 2 additional minutes.  While looking in a mirror, press and release button in center of patch. A small green light will  flash 3-4 times. This will be your only indicator that the monitor has been turned on.  Do not shower for the first 24 hours. You may shower after the first 24 hours.  Press the button if you feel a symptom. You will hear a small click. Record Date, Time and  Symptom in the Patient Logbook.  When you are ready to remove the patch, follow instructions on the last 2 pages of Patient  Logbook. Stick patch monitor onto the last page of Patient Logbook.  Place Patient Logbook in the blue and white box. Use locking tab on box and tape box closed  securely. The blue and white box has prepaid postage on it. Please place it in the mailbox as  soon as possible. Your physician should have your test results approximately 7 days after the  monitor has been mailed back to [facility].  Call [facility] Customer Care at [phone] if you have questions regarding  your ZIO XT patch  monitor. Call them immediately if you see an orange light blinking on your  monitor.  If your monitor falls off in less than 4 days, contact our Monitor department at [phone].  If your monitor becomes loose or falls off after 4 days call [employer] at [phone] for  suggestions on securing your monitor  Follow-Up: At [facility], you and your health needs are our priority.  As part of our continuing mission to provide you with exceptional heart care, we  have created designated Provider Care Teams.  These Care Teams include your primary Cardiologist (physician) and Advanced Practice Providers (APPs -  Physician Assistants and Nurse Practitioners) who all work together to provide you with the care you need, when you need it.  We recommend signing up for the patient portal called "MyChart".  Sign up information is provided on this After Visit Summary.  MyChart is used to connect with patients for Virtual Visits (Telemedicine).  Patients are able to view lab/test results, encounter notes, upcoming appointments, etc.  Non-urgent messages can be sent to your provider as well.   To learn more about what you can do with MyChart, go to [email or web address].    Your next appointment:   1 year(s)  Provider:   Donato Schultz, MD       1st Floor: - Lobby - Registration  - Pharmacy  - Lab - Cafe  2nd Floor: - PV Lab - Diagnostic Testing (echo, CT, nuclear med)  3rd Floor: - Vacant  4th Floor: - TCTS (cardiothoracic surgery) - AFib Clinic - Structural Heart Clinic - Vascular Surgery  - Vascular Ultrasound  5th Floor: - HeartCare Cardiology (general and EP) - Clinical Pharmacy for coumadin, hypertension, lipid, weight-loss medications, and med management appointments    Valet parking services will be available as well.

## 2023-04-26 NOTE — Progress Notes (Signed)
 Cardiology Office Note:  .   Date:  04/26/2023  ID:  Colin Love, DOB October 04, 1945, MRN [id number] PCP: Pincus Sanes, MD  [facility] Providers Cardiologist:  Donato Schultz, MD     History of Present Illness: .   Colin Love is a 78 y.o. male Discussed the use of AI scribe software for clinical note transcription with the patient, who gave verbal consent to proceed.  History of Present Illness   Colin Love is a 78 year old male with long-standing persistent atrial fibrillation who presents for follow-up.  He has long-standing persistent atrial fibrillation, initially identified on an EKG on February 11, 2021. Despite successful cardioversion in 2023, he reverted to atrial fibrillation shortly thereafter and is now in permanent atrial fibrillation. His heart rate is well-controlled with diltiazem 180 mg once daily, and he is anticoagulated with Eliquis 5 mg twice daily. He feels good overall.  He experiences occasional balance issues, particularly when walking or standing quickly. He has not experienced any falls. During the review of symptoms, he also noted that his hearing is not clear at times, although he can hear.  He has a history of diabetes, which contributes to chronic neuropathy. This neuropathy may affect his balance. He is on medication to manage his diabetes.  He has hypertension, managed with an ACE inhibitor and hydrochlorothiazide for renal protection.  He has hyperlipidemia, managed with atorvastatin 20 mg daily.  He stopped smoking in 2023 after a hospitalization for pneumonia, which is a positive lifestyle change.          ROS: No CP  Studies Reviewed: Marland Kitchen   EKG Interpretation Date/Time:  Monday April 26 2023 09:10:44 EST Ventricular Rate:  72 PR Interval:    QRS Duration:  80 QT Interval:  386 QTC Calculation: 422 R Axis:   258  Text Interpretation: Atrial fibrillation Right superior axis deviation Low voltage QRS Inferior infarct  (cited on or before 24-Apr-2021) Cannot rule out Anterior infarct , age undetermined When compared with ECG of 24-Apr-2021 10:05, Atrial fibrillation has replaced Sinus rhythm Minimal criteria for Anterior infarct are now Present Confirmed by Donato Schultz ([id number]) on 04/26/2023 9:31:14 AM    Results   DIAGNOSTIC EKG: Atrial fibrillation (04/26/2023)     Risk Assessment/Calculations:            Physical Exam:   VS:  BP 124/80   Pulse 72   Ht 5\' 7"  (1.702 m)   Wt 198 lb 9.6 oz (90.1 kg)   SpO2 99%   BMI 31.11 kg/m    Wt Readings from Last 3 Encounters:  04/26/23 198 lb 9.6 oz (90.1 kg)  01/27/23 195 lb (88.5 kg)  01/06/23 194 lb 11.2 oz (88.3 kg)    GEN: Well nourished, well developed in no acute distress NECK: No JVD; No carotid bruits CARDIAC: Irreg irreg normal rate, no murmurs, no rubs, no gallops RESPIRATORY:  Clear to auscultation without rales, wheezing or rhonchi  ABDOMEN: Soft, non-tender, non-distended EXTREMITIES:  No edema; No deformity   ASSESSMENT AND PLAN: .    Assessment and Plan    Atrial Fibrillation Long-standing persistent atrial fibrillation, initially noted on 02/11/2021. Successfully cardioverted in 2023 but reverted to atrial fibrillation shortly thereafter. Currently in permanent atrial fibrillation, well rate-controlled on diltiazem 180 mg once daily. Reports occasional balance issues, potentially related to atrial fibrillation, inner ear mechanisms, or diabetic neuropathy. EKG today confirms atrial fibrillation with a heart rate of 70 bpm. Discussed the use  of a Zio monitor to assess heart rate and rhythm over two weeks. Explained that if heart rate is too slow, diltiazem dosage may be adjusted. Instructed to press the button on the monitor if experiencing unsteadiness. - Order Zio monitor to assess heart rate and rhythm over two weeks - Consider adjusting diltiazem dosage based on Zio monitor results - Instruct patient to press the button on the monitor if  experiencing unsteadiness  Hypertension Chronic condition managed with an ACE inhibitor for renal protection and hydrochlorothiazide.  Diabetes Mellitus Chronic condition with associated neuropathy.  Hyperlipidemia Chronic condition managed with atorvastatin 20 mg daily.  Benign Prostatic Hyperplasia (BPH) Chronic condition.  General Health Maintenance Stopped smoking in 2023 after a hospitalization for pneumonia. No falls reported, which is important given the use of Eliquis.  Follow-up - Schedule follow-up appointment in one year. Will follow up with Zio results               Signed, Donato Schultz, MD

## 2023-04-26 NOTE — Progress Notes (Unsigned)
 Enrolled for [employer] to mail a ZIO XT long term holter monitor to the patients address on file.

## 2023-04-28 ENCOUNTER — Ambulatory Visit: Payer: Medicare HMO | Admitting: Internal Medicine

## 2023-05-02 NOTE — Progress Notes (Signed)
    Subjective:    Patient ID: Colin Love, male    DOB: 06-24-1945, 78 y.o.   MRN: [id number]      HPI Colin Love is here for No chief complaint on file.        Medications and allergies reviewed with patient and updated if appropriate.  Current Outpatient Medications on File Prior to Visit  Medication Sig Dispense Refill   ACETAMINOPHEN PO Take 650 mg by mouth every 6 (six) hours as needed for mild pain (pain score 1-3).     atorvastatin (LIPITOR) 20 MG tablet TAKE 1 TABLET BY MOUTH ONCE DAILY AT 6PM (Patient taking differently: Take 20 mg by mouth at bedtime.) 90 tablet 3   calcium carbonate (TUMS - DOSED IN MG ELEMENTAL CALCIUM) 500 MG chewable tablet Chew 1 tablet by mouth as needed for indigestion or heartburn.     diclofenac Sodium (VOLTAREN) 1 % GEL Apply 2 g topically 4 (four) times daily as needed (pain).     diltiazem (CARDIZEM CD) 180 MG 24 hr capsule TAKE 1 CAPSULE BY MOUTH TWICE A DAY (Patient taking differently: Take 180 mg by mouth 2 (two) times daily.) 180 capsule 3   ELIQUIS 5 MG TABS tablet TAKE 1 TABLET BY MOUTH TWICE A DAY 60 tablet 5   gabapentin (NEURONTIN) 100 MG capsule Take 1-2 capsules (100-200 mg total) by mouth daily as needed. 90 capsule 3   latanoprost (XALATAN) 0.005 % ophthalmic solution Place 1 drop into both eyes at bedtime.     lisinopril-hydrochlorothiazide (ZESTORETIC) 20-12.5 MG tablet TAKE 1 TABLET BY MOUTH TWICE A DAY (Patient taking differently: Take 1 tablet by mouth 2 (two) times daily.) 180 tablet 3   metFORMIN (GLUCOPHAGE) 500 MG tablet Take 1 tablet (500 mg total) by mouth 2 (two) times daily with a meal. Please schedule office visit for further refills. 60 tablet 0   potassium chloride (KLOR-CON) 10 MEQ tablet TAKE 3 TABLETS BY MOUTH DAILY. 270 tablet 1   sildenafil (VIAGRA) 100 MG tablet Take 100 mg by mouth daily as needed for erectile dysfunction.     tamsulosin (FLOMAX) 0.4 MG CAPS capsule Take 1 capsule (0.4 mg total) by mouth  daily. 7 capsule 0   terazosin (HYTRIN) 10 MG capsule TAKE 1 CAPSULE BY MOUTH EVERYDAY AT BEDTIME. Please schedule office visit for further refills 30 capsule 0   No current facility-administered medications on file prior to visit.    Review of Systems     Objective:  There were no vitals filed for this visit. BP Readings from Last 3 Encounters:  04/26/23 124/80  01/27/23 117/76  01/06/23 122/77   Wt Readings from Last 3 Encounters:  04/26/23 198 lb 9.6 oz (90.1 kg)  01/27/23 195 lb (88.5 kg)  01/06/23 194 lb 11.2 oz (88.3 kg)   There is no height or weight on file to calculate BMI.    Physical Exam         Assessment & Plan:    See Problem List for Assessment and Plan of chronic medical problems.

## 2023-05-03 ENCOUNTER — Encounter: Payer: Self-pay | Admitting: Internal Medicine

## 2023-05-03 ENCOUNTER — Ambulatory Visit (INDEPENDENT_AMBULATORY_CARE_PROVIDER_SITE_OTHER): Payer: Medicare HMO | Admitting: Internal Medicine

## 2023-05-03 VITALS — BP 130/72 | HR 75 | Temp 98.0°F | Ht 67.0 in | Wt 198.0 lb

## 2023-05-03 DIAGNOSIS — Z7984 Long term (current) use of oral hypoglycemic drugs: Secondary | ICD-10-CM | POA: Diagnosis not present

## 2023-05-03 DIAGNOSIS — R350 Frequency of micturition: Secondary | ICD-10-CM | POA: Diagnosis not present

## 2023-05-03 DIAGNOSIS — R42 Dizziness and giddiness: Secondary | ICD-10-CM | POA: Diagnosis not present

## 2023-05-03 DIAGNOSIS — E876 Hypokalemia: Secondary | ICD-10-CM

## 2023-05-03 DIAGNOSIS — M5416 Radiculopathy, lumbar region: Secondary | ICD-10-CM | POA: Diagnosis not present

## 2023-05-03 DIAGNOSIS — I10 Essential (primary) hypertension: Secondary | ICD-10-CM

## 2023-05-03 DIAGNOSIS — E785 Hyperlipidemia, unspecified: Secondary | ICD-10-CM | POA: Diagnosis not present

## 2023-05-03 DIAGNOSIS — E118 Type 2 diabetes mellitus with unspecified complications: Secondary | ICD-10-CM | POA: Diagnosis not present

## 2023-05-03 DIAGNOSIS — I4819 Other persistent atrial fibrillation: Secondary | ICD-10-CM | POA: Diagnosis not present

## 2023-05-03 LAB — CBC WITH DIFFERENTIAL/PLATELET
Basophils Absolute: 0 10*3/uL (ref 0.0–0.1)
Basophils Relative: 0.5 % (ref 0.0–3.0)
Eosinophils Absolute: 0.1 10*3/uL (ref 0.0–0.7)
Eosinophils Relative: 1.2 % (ref 0.0–5.0)
HCT: 46.2 % (ref 39.0–52.0)
Hemoglobin: 14.9 g/dL (ref 13.0–17.0)
Lymphocytes Relative: 14.3 % (ref 12.0–46.0)
Lymphs Abs: 1 10*3/uL (ref 0.7–4.0)
MCHC: 32.2 g/dL (ref 30.0–36.0)
MCV: 88.7 fl (ref 78.0–100.0)
Monocytes Absolute: 0.7 10*3/uL (ref 0.1–1.0)
Monocytes Relative: 10.7 % (ref 3.0–12.0)
Neutro Abs: 4.9 10*3/uL (ref 1.4–7.7)
Neutrophils Relative %: 73.3 % (ref 43.0–77.0)
Platelets: 156 10*3/uL (ref 150.0–400.0)
RBC: 5.21 Mil/uL (ref 4.22–5.81)
RDW: 14.7 % (ref 11.5–15.5)
WBC: 6.7 10*3/uL (ref 4.0–10.5)

## 2023-05-03 LAB — COMPREHENSIVE METABOLIC PANEL WITH GFR
ALT: 18 U/L (ref 0–53)
AST: 20 U/L (ref 0–37)
Albumin: 4.3 g/dL (ref 3.5–5.2)
Alkaline Phosphatase: 62 U/L (ref 39–117)
BUN: 16 mg/dL (ref 6–23)
CO2: 27 meq/L (ref 19–32)
Calcium: 10.7 mg/dL — ABNORMAL HIGH (ref 8.4–10.5)
Chloride: 103 meq/L (ref 96–112)
Creatinine, Ser: 1.04 mg/dL (ref 0.40–1.50)
GFR: 68.83 mL/min
Glucose, Bld: 106 mg/dL — ABNORMAL HIGH (ref 70–99)
Potassium: 3.6 meq/L (ref 3.5–5.1)
Sodium: 140 meq/L (ref 135–145)
Total Bilirubin: 1 mg/dL (ref 0.2–1.2)
Total Protein: 7.4 g/dL (ref 6.0–8.3)

## 2023-05-03 LAB — POC URINALSYSI DIPSTICK (AUTOMATED)
Bilirubin, UA: NEGATIVE
Blood, UA: NEGATIVE
Glucose, UA: NEGATIVE
Ketones, UA: NEGATIVE
Leukocytes, UA: NEGATIVE
Nitrite, UA: NEGATIVE
Protein, UA: POSITIVE — AB
Spec Grav, UA: >=1.03 — AB
Urobilinogen, UA: 0.2 U/dL — AB
pH, UA: 5.5

## 2023-05-03 LAB — LIPID PANEL
Cholesterol: 137 mg/dL (ref 0–200)
HDL: 49.4 mg/dL
LDL Cholesterol: 55 mg/dL (ref 0–99)
NonHDL: 87.86
Total CHOL/HDL Ratio: 3
Triglycerides: 165 mg/dL — ABNORMAL HIGH (ref 0.0–149.0)
VLDL: 33 mg/dL (ref 0.0–40.0)

## 2023-05-03 LAB — HEMOGLOBIN A1C: Hgb A1c MFr Bld: 7.1 % — ABNORMAL HIGH (ref 4.6–6.5)

## 2023-05-03 NOTE — Assessment & Plan Note (Addendum)
 Chronic   Lab Results  Component Value Date   HGBA1C 7.1 (H) 05/03/2023    Check A1c today- Continue metformin 500 mg twice daily Stressed regular exercise, diabetic diet'

## 2023-05-03 NOTE — Assessment & Plan Note (Signed)
 Chronic CMP Continue potassium chloride 30 mEq daily

## 2023-05-03 NOTE — Patient Instructions (Addendum)
    Have blood work downstairs.    Medications changes include :   None    Return in about 6 months (around 10/31/2023) for Physical Exam.

## 2023-05-03 NOTE — Assessment & Plan Note (Signed)
Chronic °Regular exercise and healthy diet encouraged °Check lipid panel  °Continue atorvastatin 20 mg daily °

## 2023-05-03 NOTE — Assessment & Plan Note (Signed)
 Chronic Blood pressure well controlled CMP, CBC Continue lisinopril-hydrochlorothiazide 20-12.5 mg twice daily, diltiazem 180 mg twice daily

## 2023-05-03 NOTE — Assessment & Plan Note (Signed)
 New Recently has started having increased urinary frequency at night and during the day Stream is weaker with some difficulty urinating Has prostate cancer s/p seed implantation October 2024 He does have a follow-up with urology tomorrow Urinalysis here not obviously infected Hold off on treatment Send for culture

## 2023-05-03 NOTE — Assessment & Plan Note (Signed)
 New Does have spinal stenosis and has had several issues in the past with pain Was getting injections but it was not effective Discussed PT for his current symptoms-he deferred He thinks he has the exercises at home and will try to start them-if he does not have them or if he would like to do PT he will let me know Has gabapentin at home, but not currently taking it-discussed that he can start taking that for his symptoms to see if that helps If symptoms not improving he will follow-up with sports medicine

## 2023-05-04 DIAGNOSIS — C61 Malignant neoplasm of prostate: Secondary | ICD-10-CM | POA: Diagnosis not present

## 2023-05-04 DIAGNOSIS — C672 Malignant neoplasm of lateral wall of bladder: Secondary | ICD-10-CM | POA: Diagnosis not present

## 2023-05-04 LAB — URINE CULTURE

## 2023-05-18 ENCOUNTER — Other Ambulatory Visit: Payer: Self-pay | Admitting: Internal Medicine

## 2023-05-21 DIAGNOSIS — I4819 Other persistent atrial fibrillation: Secondary | ICD-10-CM | POA: Diagnosis not present

## 2023-05-21 DIAGNOSIS — R42 Dizziness and giddiness: Secondary | ICD-10-CM | POA: Diagnosis not present

## 2023-05-27 ENCOUNTER — Encounter: Payer: Self-pay | Admitting: Cardiology

## 2023-06-27 ENCOUNTER — Encounter: Payer: Self-pay | Admitting: Internal Medicine

## 2023-06-30 ENCOUNTER — Other Ambulatory Visit: Payer: Self-pay | Admitting: Internal Medicine

## 2023-07-05 ENCOUNTER — Other Ambulatory Visit: Payer: Self-pay | Admitting: Internal Medicine

## 2023-07-06 ENCOUNTER — Other Ambulatory Visit: Payer: Self-pay | Admitting: Internal Medicine

## 2023-08-03 ENCOUNTER — Ambulatory Visit (INDEPENDENT_AMBULATORY_CARE_PROVIDER_SITE_OTHER)

## 2023-08-03 VITALS — Ht 67.0 in | Wt 198.0 lb

## 2023-08-03 DIAGNOSIS — Z800 Family history of malignant neoplasm of digestive organs: Secondary | ICD-10-CM | POA: Insufficient documentation

## 2023-08-03 DIAGNOSIS — E118 Type 2 diabetes mellitus with unspecified complications: Secondary | ICD-10-CM | POA: Diagnosis not present

## 2023-08-03 DIAGNOSIS — Z0000 Encounter for general adult medical examination without abnormal findings: Secondary | ICD-10-CM

## 2023-08-03 DIAGNOSIS — Z83719 Family history of colon polyps, unspecified: Secondary | ICD-10-CM | POA: Insufficient documentation

## 2023-08-03 DIAGNOSIS — K5900 Constipation, unspecified: Secondary | ICD-10-CM | POA: Insufficient documentation

## 2023-08-03 NOTE — Patient Instructions (Signed)
 Colin Love , Thank you for taking time out of your busy schedule to complete your Annual Wellness Visit with me. I enjoyed our conversation and look forward to speaking with you again next year. I, as well as your care team,  appreciate your ongoing commitment to your health goals. Please review the following plan we discussed and let me know if I can assist you in the future. Your Game plan/ To Do List    Referrals: If you haven't heard from the office you've been referred to, please reach out to them at the phone provided.  [facility] [place], [phone]. Follow up Visits: Next Medicare AWV with our clinical staff: 08/03/2024.   Have you seen your provider in the last 6 months (3 months if uncontrolled diabetes)? Yes Next Office Visit with your provider: 11/04/2023.  Clinician Recommendations:  Aim for 30 minutes of exercise or brisk walking, 6-8 glasses of water , and 5 servings of fruits and vegetables each day. You are due for a foot exam and a kidney evaluation and can get that done during your next office visit.  Remember to ask about a Cologard for a colon cancer screening.  I have request a refill for the Gabapentin  today.        This is a list of the screening recommended for you and due dates:  Health Maintenance  Topic Date Due   Screening for Lung Cancer  Never done   Complete foot exam   08/04/2019   Yearly kidney health urinalysis for diabetes  08/18/2023   Eye exam for diabetics  09/14/2023   Flu Shot  10/08/2023   Hemoglobin A1C  10/31/2023   Yearly kidney function blood test for diabetes  05/02/2024   Medicare Annual Wellness Visit  08/02/2024   DTaP/Tdap/Td vaccine (3 - Td or Tdap) 02/04/2032   Pneumonia Vaccine  Completed   Hepatitis C Screening  Completed   Zoster (Shingles) Vaccine  Completed   HPV Vaccine  Aged Out   Meningitis B Vaccine  Aged Out   Colon Cancer Screening  Discontinued   COVID-19 Vaccine  Discontinued    Advanced  directives: (Copy Requested) Please bring a copy of your health care power of attorney and living will to the office to be added to your chart at your convenience. You can mail to [facility] [address] [place], [place] [address] or email to [email or web address] Advance Care Planning is important because it:  [x]  Makes sure you receive the medical care that is consistent with your values, goals, and preferences  [x]  It provides guidance to your family and loved ones and reduces their decisional burden about whether or not they are making the right decisions based on your wishes.  Follow the link provided in your after visit summary or read over the paperwork we have mailed to you to help you started getting your Advance Directives in place. If you need assistance in completing these, please reach out to us  so that we can help you!  See attachments for Preventive Care and Fall Prevention Tips.

## 2023-08-03 NOTE — Progress Notes (Signed)
 Subjective:   Colin Love is a 78 y.o. who presents for a Medicare Wellness preventive visit.  As a reminder, Annual Wellness Visits don't include a physical exam, and some assessments may be limited, especially if this visit is performed virtually. We may recommend an in-person follow-up visit with your provider if needed.  Visit Complete: Virtual I connected with  Colin Love on 08/03/23 by a audio enabled telemedicine application and verified that I am speaking with the correct person using two identifiers.  Patient Location: Home  Provider Location: Home Office  I discussed the limitations of evaluation and management by telemedicine. The patient expressed understanding and agreed to proceed.  Vital Signs: Because this visit was a virtual/telehealth visit, some criteria may be missing or patient reported. Any vitals not documented were not able to be obtained and vitals that have been documented are patient reported.  VideoDeclined- This patient declined Librarian, academic. Therefore the visit was completed with audio only.  Persons Participating in Visit: Patient.  AWV Questionnaire: No: Patient Medicare AWV questionnaire was not completed prior to this visit.  Cardiac Risk Factors include: advanced age (>26men, >34 women);hypertension;Other (see comment);diabetes mellitus;dyslipidemia, Risk factor comments: A-Fib, PAC, BPH     Objective:     Today's Vitals   08/03/23 0842  Weight: 198 lb (89.8 kg)  Height: 5\' 7"  (1.702 m)  PainSc: 6    Body mass index is 31.01 kg/m.     08/03/2023    9:11 AM 01/27/2023    8:52 AM 01/06/2023   12:23 PM 09/24/2022    9:45 AM 08/31/2022    8:45 AM 08/04/2022   10:24 AM 12/25/2021    4:45 AM  Advanced Directives  Does Patient Have a Medical Advance Directive? Yes Yes Yes Yes Yes Yes   Type of Estate agent of [place];Living will Living will;Healthcare Power of [employer] Power of [place];Living will Living will;Healthcare Power of Attorney Living will;Healthcare Power of [employer] Power of [place];Living will   Does patient want to make changes to medical advance directive?   No - Patient declined      Copy of Healthcare Power of Attorney in Chart? No - copy requested  No - copy requested   No - copy requested   Would patient like information on creating a medical advance directive?            Information is confidential and restricted. Go to Review Flowsheets to unlock data.    Current Medications (verified) Outpatient Encounter Medications as of 08/03/2023  Medication Sig   ACETAMINOPHEN  PO Take 650 mg by mouth every 6 (six) hours as needed for mild pain (pain score 1-3).   atorvastatin  (LIPITOR) 20 MG tablet TAKE 1 TABLET BY MOUTH ONCE DAILY AT 6PM   calcium  carbonate (TUMS - DOSED IN MG ELEMENTAL CALCIUM ) 500 MG chewable tablet Chew 1 tablet by mouth as needed for indigestion or heartburn.   diclofenac Sodium (VOLTAREN) 1 % GEL Apply 2 g topically 4 (four) times daily as needed (pain).   diltiazem  (CARDIZEM  CD) 180 MG 24 hr capsule TAKE 1 CAPSULE BY MOUTH TWICE A DAY   ELIQUIS  5 MG TABS tablet TAKE 1 TABLET BY MOUTH TWICE A DAY   gabapentin  (NEURONTIN ) 100 MG capsule Take 1-2 capsules (100-200 mg total) by mouth daily as needed.   latanoprost  (XALATAN ) 0.005 % ophthalmic solution Place 1 drop into both eyes at bedtime.   lisinopril -hydrochlorothiazide  (ZESTORETIC ) 20-12.5 MG tablet  TAKE 1 TABLET BY MOUTH TWICE A DAY   metFORMIN  (GLUCOPHAGE ) 500 MG tablet TAKE 1 TABLET BY MOUTH 2 TIMES DAILY WITH A MEAL. PLEASE SCHEDULE OFFICE VISIT FOR FURTHER REFILLS.   potassium chloride  (KLOR-CON ) 10 MEQ tablet TAKE 3 TABLETS BY MOUTH DAILY.   sildenafil (VIAGRA) 100 MG tablet Take 100 mg by mouth daily as needed for erectile dysfunction.   tamsulosin  (FLOMAX ) 0.4 MG CAPS capsule Take 1 capsule (0.4 mg total) by mouth daily.   terazosin  (HYTRIN )  10 MG capsule TAKE 1 CAPSULE BY MOUTH EVERYDAY AT BEDTIME. PLEASE SCHEDULE OFFICE VISIT FOR FURTHER REFILLS   No facility-administered encounter medications on file as of 08/03/2023.    Allergies (verified) Patient has no known allergies.   History: Past Medical History:  Diagnosis Date   Adenoma of both adrenal glands    Anticoagulant long-term use    eliquis -- managed by cardiology   Arthritis    BPH (benign prostatic hypertrophy)    Diverticulosis of colon    ED (erectile dysfunction)    Full dentures    GERD (gastroesophageal reflux disease)    Glaucoma, both eyes    History of adenomatous polyp of colon    History of bladder cancer 02/06/2014   local, low grade pap uro carcinoma  s/p  TURBT   History of diverticulitis of colon    History of Helicobacter pylori infection 2012   treated   History of sepsis 12/25/2021   admission in epic w/ CAP   Hyperlipidemia    Hypertension    Malignant neoplasm prostate (HCC) 07/2022   urologist--- dr herrick/  radiation oncologist-- dr Lorri Rota;  dx 05/ 2024, gleason 4+3, PSA 10.0, vol 35cc   Murmur, cardiac    Neuropathy    PAC (premature atrial contraction)    Persistent atrial fibrillation [facility])    cardiologist--- dr Renna Cary;   dccv 04-24-2021   Type 2 diabetes mellitus (HCC)    followed by pcp;   (01-01-2023  per pt checks blood sugar 3 times per week,  average fasting 120s)   Past Surgical History:  Procedure Laterality Date   ANTERIOR CERVICAL DECOMP/DISCECTOMY FUSION  05/24/2003   C4  --  C7   CARDIOVERSION N/A 04/24/2021   Procedure: CARDIOVERSION;  Surgeon: Lake Pilgrim, MD;  Location: [facility] ENDOSCOPY;  Service: Cardiovascular;  Laterality: N/A;   COLONOSCOPY WITH ESOPHAGOGASTRODUODENOSCOPY (EGD)     last 07-24-2013   CYSTOSCOPY W/ RETROGRADES Bilateral 02/09/2014   Procedure: BILATERAL RETROGRADE PYELOGRAM , Cystoscopy;  Surgeon: Andrez Banker, MD;  Location: [facility];  Service: Urology;   Laterality: Bilateral;   RADIOACTIVE SEED IMPLANT N/A 01/06/2023   Procedure: RADIOACTIVE SEED IMPLANT/BRACHYTHERAPY IMPLANT;  Surgeon: Andrez Banker, MD;  Location: [facility];  Service: Urology;  Laterality: N/A;   SPACE OAR INSTILLATION N/A 01/06/2023   Procedure: SPACE OAR INSTILLATION;  Surgeon: Andrez Banker, MD;  Location: [facility];  Service: Urology;  Laterality: N/A;   TRANSURETHRAL RESECTION OF BLADDER TUMOR WITH GYRUS (TURBT-GYRUS) N/A 02/09/2014   Procedure: Bladder Biopsies;  Surgeon: Andrez Banker, MD;  Location: [facility];  Service: Urology;  Laterality: N/A;   Family History  Problem Relation Age of Onset   Colon cancer Mother    Hypertension Mother    Hyperlipidemia Mother    Hypertension Father    Hyperlipidemia Father    Diabetes Brother    Social History   Socioeconomic History   Marital status: Married  Spouse name: Jerryl Morin   Number of children: 2   Years of education: Not on file   Highest education level: Not on file  Occupational History   Occupation: Part time work/[employer] RETIRED  Tobacco Use   Smoking status: Former    Current packs/day: 0.00    Average packs/day: 0.5 packs/day for 54.8 years (27.4 ttl pk-yrs)    Types: Cigarettes    Start date: 24    Quit date: 12/25/2021    Years since quitting: 1.6   Smokeless tobacco: Never   Tobacco comments:    01-01-2023  per pt quit smoking 12-25-2021 (stopped due to admission in hospital),  started age 89,  smoked for 54 yrs  Vaping Use   Vaping status: Never Used  Substance and Sexual Activity   Alcohol use: Yes    Alcohol/week: 1.0 - 2.0 standard drink of alcohol    Types: 1 - 2 Standard drinks or equivalent per week    Comment: 01-01-2023  per pt on saturday's 1-2   Drug use: Never   Sexual activity: Not on file  Other Topics Concern   Not on file  Social History Narrative   Lives with wife/2025   One son passed   Social  Drivers of Health   Financial Resource Strain: Low Risk  (08/03/2023)   Overall Financial Resource Strain (CARDIA)    Difficulty of Paying Living Expenses: Not very hard  Food Insecurity: No Food Insecurity (08/03/2023)   Hunger Vital Sign    Worried About Running Out of Food in the Last Year: Never true    Ran Out of Food in the Last Year: Never true  Transportation Needs: No Transportation Needs (08/03/2023)   PRAPARE - Administrator, Civil Service (Medical): No    Lack of Transportation (Non-Medical): No  Physical Activity: Inactive (08/03/2023)   Exercise Vital Sign    Days of Exercise per Week: 0 days    Minutes of Exercise per Session: 0 min  Stress: No Stress Concern Present (08/03/2023)   [employer] of Occupational Health - Occupational Stress Questionnaire    Feeling of Stress : Not at all  Social Connections: Socially Integrated (08/03/2023)   Social Connection and Isolation Panel [NHANES]    Frequency of Communication with Friends and Family: More than three times a week    Frequency of Social Gatherings with Friends and Family: Once a week    Attends Religious Services: More than 4 times per year    Active Member of [employer] or Organizations: Yes    Attends Banker Meetings: Never    Marital Status: Married    Tobacco Counseling Counseling given: Not Answered Tobacco comments: 01-01-2023  per pt quit smoking 12-25-2021 (stopped due to admission in hospital),  started age 21,  smoked for 54 yrs    Clinical Intake:     Pain : 0-10 Pain Score: 6  Pain Type: Acute pain Pain Location: Abdomen (Rt side pain) Pain Orientation: Right Pain Radiating Towards: rt hip and rt leg Pain Descriptors / Indicators: Aching, Discomfort Pain Onset: More than a month ago Pain Frequency: Constant Pain Relieving Factors: Tylenol   Pain Relieving Factors: Tylenol   BMI - recorded: 31.01 Nutritional Status: BMI > 30  Obese Nutritional Risks:  None Diabetes: Yes CBG done?: Yes (149-fasting per pt) CBG resulted in Enter/ Edit results?: No Did pt. bring in CBG monitor from home?: No  Lab Results  Component Value Date   HGBA1C 7.1 (H) 05/03/2023  HGBA1C 6.9 (H) 08/18/2022   HGBA1C 6.7 (H) 02/12/2022     How often do you need to have someone help you when you read instructions, pamphlets, or other written materials from your doctor or pharmacy?: 1 - Never  Interpreter Needed?: No  Information entered by :: Halina Asano, RMA   Activities of Daily Living     08/03/2023    8:43 AM 01/06/2023   12:29 PM  In your present state of health, do you have any difficulty performing the following activities:  Hearing? 1 1  Comment Per pt -has some hearing loss.   Vision? 0 0  Difficulty concentrating or making decisions? 0 0  Walking or climbing stairs? 0   Dressing or bathing? 0   Doing errands, shopping? 0   Preparing Food and eating ? N   Using the Toilet? N   In the past six months, have you accidently leaked urine? Y   Do you have problems with loss of bowel control? N   Managing your Medications? N   Managing your Finances? N   Housekeeping or managing your Housekeeping? N     Patient Care Team: Colene Dauphin, MD as PCP - General (Internal Medicine) Hugh Madura, MD as PCP - Cardiology (Cardiology) Tami Falcon, MD as Consulting Physician (Gastroenterology) Andrez Banker, MD (Urology) Hugh Madura, MD (Cardiology) Jonathan Neighbor, [facility] (Inactive) as Pharmacist (Pharmacist) Alvina Axon, MD as Consulting Physician (Ophthalmology) Katheleen Palmer, RN as Oncology Nurse Navigator Andrez Banker, MD as Attending Physician (Urology) Kenith Payer, MD as Consulting Physician (Radiation Oncology) Debbie Fails Laura Polio, NP as Nurse Practitioner (Hematology and Oncology) Neda Balk, RN as Registered Nurse  Indicate any recent Medical Services you may have received from other than [facility] providers  in the past year (date may be approximate).     Assessment:    This is a routine wellness examination for Saiquan.  Hearing/Vision screen Hearing Screening - Comments:: Per pt -has some hearing loss. Vision Screening - Comments:: Denies vision issues.    Goals Addressed             This Visit's Progress    Patient Stated   On track    Increase my physical activity by starting to walk twice a week. 2025       Depression Screen     08/03/2023    9:15 AM 08/31/2022    8:58 AM 08/04/2022   10:19 AM 02/12/2022    9:35 AM 12/31/2021   11:05 AM 10/13/2021    3:46 PM 10/10/2020   11:08 AM  PHQ 2/9 Scores  PHQ - 2 Score 0 0 0 0 0 0 0  PHQ- 9 Score 1   0  0   Exception Documentation      Patient refusal     Fall Risk     08/03/2023    9:11 AM 08/04/2022   10:25 AM 02/12/2022    9:35 AM 10/13/2021    3:46 PM 10/10/2020   11:11 AM  Fall Risk   Falls in the past year? 0 0 0 0 0  Number falls in past yr: 0 0 0 0 0  Injury with Fall? 0 0 0 1 0  Risk for fall due to : Impaired balance/gait No Fall Risks No Fall Risks No Fall Risks No Fall Risks  Follow up Falls evaluation completed;Falls prevention discussed Falls prevention discussed;Falls evaluation completed Falls evaluation completed Education provided Falls evaluation completed  MEDICARE RISK AT HOME:  Medicare Risk at Home Any stairs in or around the home?: Yes (outside 2-3) If so, are there any without handrails?: Yes Home free of loose throw rugs in walkways, pet beds, electrical cords, etc?: Yes Adequate lighting in your home to reduce risk of falls?: Yes Life alert?: No Use of a cane, walker or w/c?: No Grab bars in the bathroom?: Yes Shower chair or bench in shower?: No Elevated toilet seat or a handicapped toilet?: No  TIMED UP AND GO:  Was the test performed?  No  Cognitive Function: Declined/Normal: No cognitive concerns noted by patient or family. Patient alert, oriented, able to answer questions appropriately  and recall recent events. No signs of memory loss or confusion.    05/27/2017   10:29 AM 11/26/2015    1:39 PM  MMSE - Mini Mental State Exam  Not completed:  --  Orientation to time 5   Orientation to Place 5   Registration 3   Attention/ Calculation 5   Recall 3   Language- name 2 objects 2   Language- repeat 1   Language- follow 3 step command 3   Language- read & follow direction 1   Write a sentence 1   Copy design 1   Total score 30         08/04/2022   10:20 AM  6CIT Screen  What Year? 0 points  What month? 0 points  What time? 0 points  Count back from 20 0 points  Months in reverse 2 points  Repeat phrase 0 points  Total Score 2 points    Immunizations Immunization History  Administered Date(s) Administered   Fluad Quad(high Dose 65+) 02/07/2019, 02/07/2020, 02/11/2021, 01/05/2022   Influenza Split 03/20/2011   Influenza, High Dose Seasonal PF 01/30/2013, 01/09/2014, 11/13/2014, 11/26/2015, 11/26/2016, 11/29/2017   Influenza, Seasonal, Injecte, Preservative Fre 03/17/2012   PFIZER(Purple Top)SARS-COV-2 Vaccination 04/20/2019, 05/15/2019, 02/06/2020   Pneumococcal Conjugate-13 11/13/2014   Pneumococcal Polysaccharide-23 03/20/2011   Respiratory Syncytial Virus Vaccine,Recomb Aduvanted(Arexvy) 02/19/2022   Tdap 07/02/2010, 02/03/2022   Zoster Recombinant(Shingrix) 01/13/2022, 07/21/2022    Screening Tests Health Maintenance  Topic Date Due   Lung Cancer Screening  Never done   FOOT EXAM  08/04/2019   Diabetic kidney evaluation - Urine ACR  08/18/2023   OPHTHALMOLOGY EXAM  09/14/2023   INFLUENZA VACCINE  10/08/2023   HEMOGLOBIN A1C  10/31/2023   Diabetic kidney evaluation - eGFR measurement  05/02/2024   Medicare Annual Wellness (AWV)  08/02/2024   DTaP/Tdap/Td (3 - Td or Tdap) 02/04/2032   Pneumonia Vaccine 75+ Years old  Completed   Hepatitis C Screening  Completed   Zoster Vaccines- Shingrix  Completed   HPV VACCINES  Aged Out   Meningococcal B  Vaccine  Aged Out   Colonoscopy  Discontinued   COVID-19 Vaccine  Discontinued    Health Maintenance  Health Maintenance Due  Topic Date Due   Lung Cancer Screening  Never done   FOOT EXAM  08/04/2019   Diabetic kidney evaluation - Urine ACR  08/18/2023   Health Maintenance Items Addressed: Lung Cancer Screening ordered, Diabetic Foot Exam scheduled, UACR (Urine Albumin:Creatinine Ratio), See Nurse Notes  Additional Screening:  Vision Screening: Recommended annual ophthalmology exams for early detection of glaucoma and other disorders of the eye.  Dental Screening: Recommended annual dental exams for proper oral hygiene  Community Resource Referral / Chronic Care Management: CRR required this visit?  No   CCM required this visit?  No   Plan:    I have personally reviewed and noted the following in the patient's chart:   Medical and social history Use of alcohol, tobacco or illicit drugs  Current medications and supplements including opioid prescriptions. Patient is not currently taking opioid prescriptions. Functional ability and status Nutritional status Physical activity Advanced directives List of other physicians Hospitalizations, surgeries, and ER visits in previous 12 months Vitals Screenings to include cognitive, depression, and falls Referrals and appointments  In addition, I have reviewed and discussed with patient certain preventive protocols, quality metrics, and best practice recommendations. A written personalized care plan for preventive services as well as general preventive health recommendations were provided to patient.   Zoraida Havrilla L Churchill Grimsley, CMA   08/03/2023   After Visit Summary: (MyChart) Due to this being a telephonic visit, the after visit summary with patients personalized plan was offered to patient via MyChart   Notes: Please refer to Routing Comments.

## 2023-08-18 ENCOUNTER — Telehealth: Payer: Self-pay | Admitting: Cardiology

## 2023-08-18 DIAGNOSIS — I4819 Other persistent atrial fibrillation: Secondary | ICD-10-CM

## 2023-08-18 MED ORDER — APIXABAN 5 MG PO TABS: 5.0000 mg | ORAL_TABLET | Freq: Two times a day (BID) | ORAL | 1 refills | Status: DC

## 2023-08-18 NOTE — Telephone Encounter (Signed)
 Prescription refill request for Eliquis  received. Indication: Afib  Last office visit: 04/26/23 Renna Cary)  Scr: 1.04 (05/03/23)  Age: 78 Weight: 89.8kg  Appropriate dose. Refill sent.

## 2023-08-18 NOTE — Telephone Encounter (Signed)
*  STAT* If patient is at the pharmacy, call can be transferred to refill team.   1. Which medications need to be refilled? (please list name of each medication and dose if known) ELIQUIS 5 MG TABS tablet   2. Which pharmacy/location (including street and city if local pharmacy) is medication to be sent to?  [id number] - [place], [place] - [address]    3. Do they need a 30 day or 90 day supply? 90  

## 2023-09-01 ENCOUNTER — Other Ambulatory Visit: Payer: Self-pay | Admitting: Internal Medicine

## 2023-10-31 ENCOUNTER — Other Ambulatory Visit: Payer: Self-pay | Admitting: Internal Medicine

## 2023-11-03 ENCOUNTER — Encounter: Payer: Self-pay | Admitting: Internal Medicine

## 2023-11-03 NOTE — Progress Notes (Signed)
 Subjective:    Patient ID: Colin Love, male    DOB: [birth year], 78 y.o.   MRN: [id number]     HPI [place] is here for a physical exam and his chronic medical problems.      Medications and allergies reviewed with patient and updated if appropriate.  Current Outpatient Medications on File Prior to Visit  Medication Sig Dispense Refill   ACETAMINOPHEN  PO Take 650 mg by mouth every 6 (six) hours as needed for mild pain (pain score 1-3).     apixaban  (ELIQUIS ) 5 MG TABS tablet Take 1 tablet (5 mg total) by mouth 2 (two) times daily. 180 tablet 1   atorvastatin  (LIPITOR) 20 MG tablet TAKE 1 TABLET BY MOUTH ONCE DAILY AT 6PM 90 tablet 3   calcium  carbonate (TUMS - DOSED IN MG ELEMENTAL CALCIUM ) 500 MG chewable tablet Chew 1 tablet by mouth as needed for indigestion or heartburn.     diclofenac Sodium (VOLTAREN) 1 % GEL Apply 2 g topically 4 (four) times daily as needed (pain).     diltiazem  (CARDIZEM  CD) 180 MG 24 hr capsule TAKE 1 CAPSULE BY MOUTH TWICE A DAY 180 capsule 3   gabapentin  (NEURONTIN ) 100 MG capsule Take 1-2 capsules (100-200 mg total) by mouth daily as needed. 90 capsule 3   latanoprost  (XALATAN ) 0.005 % ophthalmic solution Place 1 drop into both eyes at bedtime.     lisinopril -hydrochlorothiazide  (ZESTORETIC ) 20-12.5 MG tablet TAKE 1 TABLET BY MOUTH TWICE A DAY 180 tablet 3   metFORMIN  (GLUCOPHAGE ) 500 MG tablet TAKE 1 TABLET BY MOUTH 2 TIMES DAILY WITH A MEAL. PLEASE SCHEDULE OFFICE VISIT FOR FURTHER REFILLS. 180 tablet 1   potassium chloride  (KLOR-CON ) 10 MEQ tablet TAKE 3 TABLETS BY MOUTH DAILY. 270 tablet 1   sildenafil (VIAGRA) 100 MG tablet Take 100 mg by mouth daily as needed for erectile dysfunction.     tamsulosin  (FLOMAX ) 0.4 MG CAPS capsule Take 1 capsule (0.4 mg total) by mouth daily. 7 capsule 0   terazosin  (HYTRIN ) 10 MG capsule TAKE 1 CAPSULE BY MOUTH EVERYDAY AT BEDTIME. PLEASE SCHEDULE OFFICE VISIT FOR FURTHER REFILLS 90 capsule 0   No current  facility-administered medications on file prior to visit.    Review of Systems     Objective:  There were no vitals filed for this visit. There were no vitals filed for this visit. There is no height or weight on file to calculate BMI.  BP Readings from Last 3 Encounters:  05/03/23 130/72  04/26/23 124/80  01/27/23 117/76    Wt Readings from Last 3 Encounters:  08/03/23 198 lb (89.8 kg)  05/03/23 198 lb (89.8 kg)  04/26/23 198 lb 9.6 oz (90.1 kg)      Physical Exam Constitutional: He appears well-developed and well-nourished. No distress.  HENT:  Head: Normocephalic and atraumatic.  Right Ear: External ear normal.  Left Ear: External ear normal.  Normal ear canals and TM b/l  Mouth/Throat: Oropharynx is clear and moist. Eyes: Conjunctivae and EOM are normal.  Neck: Neck supple. No tracheal deviation present. No thyromegaly present.  No carotid bruit  Cardiovascular: Normal rate, regular rhythm, normal heart sounds and intact distal pulses.   No murmur heard.  No lower extremity edema. Pulmonary/Chest: Effort normal and breath sounds normal. No respiratory distress. He has no wheezes. He has no rales.  Abdominal: Soft. He exhibits no distension. There is no tenderness.  Genitourinary: deferred  Lymphadenopathy:   He has no cervical adenopathy.  Skin: Skin is warm and dry. He is not diaphoretic.  Psychiatric: He has a normal mood and affect. His behavior is normal.         Assessment & Plan:   Physical exam: Screening blood work  ordered Exercise    Weight   Substance abuse   none   Reviewed recommended immunizations.   Health Maintenance  Topic Date Due   Diabetic kidney evaluation - Urine ACR  Never done   Lung Cancer Screening  Never done   FOOT EXAM  08/04/2019   OPHTHALMOLOGY EXAM  09/14/2023   INFLUENZA VACCINE  10/08/2023   HEMOGLOBIN A1C  10/31/2023   Diabetic kidney evaluation - eGFR measurement  05/02/2024   Medicare Annual Wellness (AWV)   08/02/2024   DTaP/Tdap/Td (3 - Td or Tdap) 02/04/2032   Pneumococcal Vaccine: 50+ Years  Completed   Hepatitis C Screening  Completed   Zoster Vaccines- Shingrix  Completed   HPV VACCINES  Aged Out   Meningococcal B Vaccine  Aged Out   Colonoscopy  Discontinued   COVID-19 Vaccine  Discontinued     See Problem List for Assessment and Plan of chronic medical problems.

## 2023-11-03 NOTE — Patient Instructions (Addendum)
 "     Blood work was ordered.       No change in meds right now.    Return in about 6 months (around 05/06/2024) for follow up.    Health Maintenance, Male Adopting a healthy lifestyle and getting preventive care are important in promoting health and wellness. Ask your health care provider about: The right schedule for you to have regular tests and exams. Things you can do on your own to prevent diseases and keep yourself healthy. What should I know about diet, weight, and exercise? Eat a healthy diet  Eat a diet that includes plenty of vegetables, fruits, low-fat dairy products, and lean protein. Do not eat a lot of foods that are high in solid fats, added sugars, or sodium. Maintain a healthy weight Body mass index (BMI) is a measurement that can be used to identify possible weight problems. It estimates body fat based on height and weight. Your health care provider can help determine your BMI and help you achieve or maintain a healthy weight. Get regular exercise Get regular exercise. This is one of the most important things you can do for your health. Most adults should: Exercise for at least 150 minutes each week. The exercise should increase your heart rate and make you sweat (moderate-intensity exercise). Do strengthening exercises at least twice a week. This is in addition to the moderate-intensity exercise. Spend less time sitting. Even light physical activity can be beneficial. Watch cholesterol and blood lipids Have your blood tested for lipids and cholesterol at 79 years of age, then have this test every 5 years. You may need to have your cholesterol levels checked more often if: Your lipid or cholesterol levels are high. You are older than 79 years of age. You are at high risk for heart disease. What should I know about cancer screening? Many types of cancers can be detected early and may often be prevented. Depending on your health history and family history, you  may need to have cancer screening at various ages. This may include screening for: Colorectal cancer. Prostate cancer. Skin cancer. Lung cancer. What should I know about heart disease, diabetes, and high blood pressure? Blood pressure and heart disease High blood pressure causes heart disease and increases the risk of stroke. This is more likely to develop in people who have high blood pressure readings or are overweight. Talk with your health care provider about your target blood pressure readings. Have your blood pressure checked: Every 3-5 years if you are 46-48 years of age. Every year if you are 31 years old or older. If you are between the ages of 87 and 37 and are a current or former smoker, ask your health care provider if you should have a one-time screening for abdominal aortic aneurysm (AAA). Diabetes Have regular diabetes screenings. This checks your fasting blood sugar level. Have the screening done: Once every three years after age 68 if you are at a normal weight and have a low risk for diabetes. More often and at a younger age if you are overweight or have a high risk for diabetes. What should I know about preventing infection? Hepatitis B If you have a higher risk for hepatitis B, you should be screened for this virus. Talk with your health care provider to find out if you are at risk for hepatitis B infection. Hepatitis C Blood testing is recommended for: Everyone born from [birth year] through 1966. Anyone with known risk factors for hepatitis C. Sexually transmitted  infections (STIs) You should be screened each year for STIs, including gonorrhea and chlamydia, if: You are sexually active and are younger than 79 years of age. You are older than 79 years of age and your health care provider tells you that you are at risk for this type of infection. Your sexual activity has changed since you were last screened, and you are at increased risk for chlamydia or gonorrhea. Ask your  health care provider if you are at risk. Ask your health care provider about whether you are at high risk for HIV. Your health care provider may recommend a prescription medicine to help prevent HIV infection. If you choose to take medicine to prevent HIV, you should first get tested for HIV. You should then be tested every 3 months for as long as you are taking the medicine. Follow these instructions at home: Alcohol use Do not drink alcohol if your health care provider tells you not to drink. If you drink alcohol: Limit how much you have to 0-2 drinks a day. Know how much alcohol is in your drink. In the [place] one drink equals one 12 oz bottle of beer (355 mL), one 5 oz glass of wine (148 mL), or one 1 oz glass of hard liquor (44 mL). Lifestyle Do not use any products that contain nicotine  or tobacco. These products include cigarettes, chewing tobacco, and vaping devices, such as e-cigarettes. If you need help quitting, ask your health care provider. Do not use street drugs. Do not share needles. Ask your health care provider for help if you need support or information about quitting drugs. General instructions Schedule regular health, dental, and eye exams. Stay current with your vaccines. Tell your health care provider if: You often feel depressed. You have ever been abused or do not feel safe at home. Summary Adopting a healthy lifestyle and getting preventive care are important in promoting health and wellness. Follow your health care provider's instructions about healthy diet, exercising, and getting tested or screened for diseases. Follow your health care provider's instructions on monitoring your cholesterol and blood pressure. This information is not intended to replace advice given to you by your health care provider. Make sure you discuss any questions you have with your health care provider. Document Revised: 07/15/2020 Document Reviewed: 07/15/2020 Elsevier Patient Education   2024 [employer]. "

## 2023-11-04 ENCOUNTER — Ambulatory Visit (INDEPENDENT_AMBULATORY_CARE_PROVIDER_SITE_OTHER): Payer: Medicare HMO | Admitting: Internal Medicine

## 2023-11-04 ENCOUNTER — Ambulatory Visit: Payer: Self-pay | Admitting: Internal Medicine

## 2023-11-04 VITALS — BP 120/76 | HR 80 | Temp 98.1°F | Ht 67.0 in | Wt 197.0 lb

## 2023-11-04 DIAGNOSIS — Z8551 Personal history of malignant neoplasm of bladder: Secondary | ICD-10-CM

## 2023-11-04 DIAGNOSIS — E1169 Type 2 diabetes mellitus with other specified complication: Secondary | ICD-10-CM | POA: Diagnosis not present

## 2023-11-04 DIAGNOSIS — Z7984 Long term (current) use of oral hypoglycemic drugs: Secondary | ICD-10-CM

## 2023-11-04 DIAGNOSIS — M5416 Radiculopathy, lumbar region: Secondary | ICD-10-CM | POA: Diagnosis not present

## 2023-11-04 DIAGNOSIS — I152 Hypertension secondary to endocrine disorders: Secondary | ICD-10-CM | POA: Diagnosis not present

## 2023-11-04 DIAGNOSIS — E876 Hypokalemia: Secondary | ICD-10-CM | POA: Diagnosis not present

## 2023-11-04 DIAGNOSIS — Z0000 Encounter for general adult medical examination without abnormal findings: Secondary | ICD-10-CM | POA: Diagnosis not present

## 2023-11-04 DIAGNOSIS — R2689 Other abnormalities of gait and mobility: Secondary | ICD-10-CM | POA: Diagnosis not present

## 2023-11-04 DIAGNOSIS — I4819 Other persistent atrial fibrillation: Secondary | ICD-10-CM

## 2023-11-04 DIAGNOSIS — E785 Hyperlipidemia, unspecified: Secondary | ICD-10-CM | POA: Diagnosis not present

## 2023-11-04 DIAGNOSIS — E118 Type 2 diabetes mellitus with unspecified complications: Secondary | ICD-10-CM

## 2023-11-04 DIAGNOSIS — E1159 Type 2 diabetes mellitus with other circulatory complications: Secondary | ICD-10-CM

## 2023-11-04 DIAGNOSIS — M48061 Spinal stenosis, lumbar region without neurogenic claudication: Secondary | ICD-10-CM | POA: Diagnosis not present

## 2023-11-04 DIAGNOSIS — G6289 Other specified polyneuropathies: Secondary | ICD-10-CM | POA: Diagnosis not present

## 2023-11-04 DIAGNOSIS — H409 Unspecified glaucoma: Secondary | ICD-10-CM

## 2023-11-04 DIAGNOSIS — N400 Enlarged prostate without lower urinary tract symptoms: Secondary | ICD-10-CM

## 2023-11-04 LAB — CBC WITH DIFFERENTIAL/PLATELET
Basophils Absolute: 0 10*3/uL (ref 0.0–0.1)
Basophils Relative: 0.7 % (ref 0.0–3.0)
Eosinophils Absolute: 0.1 10*3/uL (ref 0.0–0.7)
Eosinophils Relative: 1.6 % (ref 0.0–5.0)
HCT: 45.3 % (ref 39.0–52.0)
Hemoglobin: 14.6 g/dL (ref 13.0–17.0)
Lymphocytes Relative: 14.7 % (ref 12.0–46.0)
Lymphs Abs: 0.9 10*3/uL (ref 0.7–4.0)
MCHC: 32.2 g/dL (ref 30.0–36.0)
MCV: 87.4 fl (ref 78.0–100.0)
Monocytes Absolute: 0.5 10*3/uL (ref 0.1–1.0)
Monocytes Relative: 8.7 % (ref 3.0–12.0)
Neutro Abs: 4.4 10*3/uL (ref 1.4–7.7)
Neutrophils Relative %: 74.3 % (ref 43.0–77.0)
Platelets: 154 10*3/uL (ref 150.0–400.0)
RBC: 5.18 Mil/uL (ref 4.22–5.81)
RDW: 14.1 % (ref 11.5–15.5)
WBC: 5.9 10*3/uL (ref 4.0–10.5)

## 2023-11-04 LAB — COMPREHENSIVE METABOLIC PANEL WITH GFR
ALT: 19 U/L (ref 0–53)
AST: 19 U/L (ref 0–37)
Albumin: 4.5 g/dL (ref 3.5–5.2)
Alkaline Phosphatase: 74 U/L (ref 39–117)
BUN: 14 mg/dL (ref 6–23)
CO2: 28 meq/L (ref 19–32)
Calcium: 10.5 mg/dL (ref 8.4–10.5)
Chloride: 103 meq/L (ref 96–112)
Creatinine, Ser: 1.11 mg/dL (ref 0.40–1.50)
GFR: 63.43 mL/min
Glucose, Bld: 131 mg/dL — ABNORMAL HIGH (ref 70–99)
Potassium: 4.6 meq/L (ref 3.5–5.1)
Sodium: 142 meq/L (ref 135–145)
Total Bilirubin: 0.7 mg/dL (ref 0.2–1.2)
Total Protein: 7.2 g/dL (ref 6.0–8.3)

## 2023-11-04 LAB — LIPID PANEL
Cholesterol: 135 mg/dL (ref 0–200)
HDL: 49.6 mg/dL
LDL Cholesterol: 66 mg/dL (ref 0–99)
NonHDL: 85.38
Total CHOL/HDL Ratio: 3
Triglycerides: 95 mg/dL (ref 0.0–149.0)
VLDL: 19 mg/dL (ref 0.0–40.0)

## 2023-11-04 LAB — TSH: TSH: 1.11 u[IU]/mL (ref 0.35–5.50)

## 2023-11-04 LAB — HEMOGLOBIN A1C: Hgb A1c MFr Bld: 7.3 % — ABNORMAL HIGH (ref 4.6–6.5)

## 2023-11-04 LAB — MICROALBUMIN / CREATININE URINE RATIO
Creatinine,U: 97.2 mg/dL
Microalb Creat Ratio: 227.5 mg/g — ABNORMAL HIGH (ref 0.0–30.0)
Microalb, Ur: 22.1 mg/dL — ABNORMAL HIGH (ref 0.0–1.9)

## 2023-11-04 MED ORDER — GABAPENTIN 100 MG PO CAPS: 100.0000 mg | ORAL_CAPSULE | Freq: Two times a day (BID) | ORAL | 3 refills | Status: AC | PRN

## 2023-11-04 NOTE — Assessment & Plan Note (Signed)
 Chronic ?Regular exercise and healthy diet encouraged ?Check lipid panel, CMP, TSH ?Continue atorvastatin 20 mg daily ?

## 2023-11-04 NOTE — Assessment & Plan Note (Addendum)
 Chronic Controlled Following with urology On  Hytrin  10 mg daily

## 2023-11-04 NOTE — Assessment & Plan Note (Signed)
 New States that his balance is poor Discussed this is likely multifactorial Lumbar cyst spinal stenosis, peripheral neuropathy, osteoarthritis and possibly some physical deconditioning all contributing Deferred PT Discussed doing balance exercises at home

## 2023-11-04 NOTE — Assessment & Plan Note (Addendum)
 Chronic Blood pressure well controlled He occasionally has lightheadedness with standing-he tries to sit on the end of the bed before he gets up Mild decrease in SBP upon standing-try increasing fluids and monitor symptoms-if persist may need to adjust BP medication CMP, CBC Continue lisinopril -hydrochlorothiazide  20-12.5 mg twice daily, diltiazem  180 mg twice daily

## 2023-11-04 NOTE — Assessment & Plan Note (Signed)
 Chronic Follows with cardiology On Eliquis  5 mg twice daily, diltiazem  180 mg twice daily CBC, CMP, TSH

## 2023-11-04 NOTE — Assessment & Plan Note (Signed)
 Chronic Eye exams up-to-date

## 2023-11-04 NOTE — Assessment & Plan Note (Signed)
 Chronic CMP Continue potassium chloride 30 mEq daily

## 2023-11-04 NOTE — Assessment & Plan Note (Addendum)
 Chronic with neurogenic claudication Limited in activity Had injections but that did not seem to help much Continue gabapentin  daily as needed-try starting to take it more regularly since he is having right sided radiculopathy

## 2023-11-04 NOTE — Assessment & Plan Note (Signed)
 History of elevated calcium  Check PTH, intact and calcium 

## 2023-11-04 NOTE — Assessment & Plan Note (Signed)
 Chronic Possibly related to lumbar spinal stenosis and diabetes Continue gabapentin  100-200 mg daily as needed

## 2023-11-04 NOTE — Assessment & Plan Note (Signed)
 Chronic With hyperlipidemia and hypertension  Lab Results  Component Value Date   HGBA1C 7.1 (H) 05/03/2023    Check A1c, urine albumin/creatinine ratio Continue metformin  500 mg twice daily Stressed regular exercise, diabetic diet'

## 2023-11-04 NOTE — Assessment & Plan Note (Signed)
Following with urology No evidence of recurrence 

## 2023-11-04 NOTE — Assessment & Plan Note (Signed)
 Subacute Started earlier this year Discussed that pain in his leg is likely related to his back Discussed PT-deferred Not currently taking gabapentin -advised him to restart 100-200 mg twice daily as needed Discussed going back to see Dr. Joane for further evaluation-?  With an injection help

## 2023-11-05 LAB — PTH, INTACT AND CALCIUM
Calcium: 10.6 mg/dL — ABNORMAL HIGH (ref 8.6–10.3)
PTH: 54 pg/mL (ref 16–77)

## 2023-11-10 NOTE — Progress Notes (Unsigned)
   [name], PhD, LAT, ATC acting as a scribe for Artist Lloyd, MD.  Colin Love is a 78 y.o. male who presents to [facility] at [facility] today for bilat LE paresthesia. Pt was last seen for bilat LE weakness do to spinal stenosis in 2021. Last lumbar ESI, 10/21/20.  Today, pt c/o bilat LE paresthesia x ***  Pertinent review of systems: ***  Relevant historical information: ***   Exam:  There were no vitals taken for this visit. General: Well Developed, well nourished, and in no acute distress.   MSK: ***    Lab and Radiology Results No results found for this or any previous visit (from the past 72 hours). No results found.     Assessment and Plan: 78 y.o. male with ***   PDMP not reviewed this encounter. No orders of the defined types were placed in this encounter.  No orders of the defined types were placed in this encounter.    Discussed warning signs or symptoms. Please see discharge instructions. Patient expresses understanding.   ***

## 2023-11-11 ENCOUNTER — Encounter: Payer: Self-pay | Admitting: Family Medicine

## 2023-11-11 ENCOUNTER — Ambulatory Visit (INDEPENDENT_AMBULATORY_CARE_PROVIDER_SITE_OTHER)

## 2023-11-11 ENCOUNTER — Ambulatory Visit: Admitting: Family Medicine

## 2023-11-11 VITALS — BP 124/82 | HR 63 | Ht 67.0 in | Wt 202.0 lb

## 2023-11-11 DIAGNOSIS — M545 Low back pain, unspecified: Secondary | ICD-10-CM | POA: Diagnosis not present

## 2023-11-11 DIAGNOSIS — M542 Cervicalgia: Secondary | ICD-10-CM | POA: Diagnosis not present

## 2023-11-11 DIAGNOSIS — M5412 Radiculopathy, cervical region: Secondary | ICD-10-CM

## 2023-11-11 DIAGNOSIS — M47812 Spondylosis without myelopathy or radiculopathy, cervical region: Secondary | ICD-10-CM | POA: Diagnosis not present

## 2023-11-11 DIAGNOSIS — R29898 Other symptoms and signs involving the musculoskeletal system: Secondary | ICD-10-CM | POA: Diagnosis not present

## 2023-11-11 DIAGNOSIS — M5116 Intervertebral disc disorders with radiculopathy, lumbar region: Secondary | ICD-10-CM | POA: Diagnosis not present

## 2023-11-11 DIAGNOSIS — M5416 Radiculopathy, lumbar region: Secondary | ICD-10-CM | POA: Diagnosis not present

## 2023-11-11 DIAGNOSIS — M4726 Other spondylosis with radiculopathy, lumbar region: Secondary | ICD-10-CM | POA: Diagnosis not present

## 2023-11-11 DIAGNOSIS — Z981 Arthrodesis status: Secondary | ICD-10-CM | POA: Diagnosis not present

## 2023-11-11 NOTE — Patient Instructions (Addendum)
 Thank you for coming in today.   Please get an Xray today before you leave   We are ordering an MRI for you today.  The imaging office will be calling you to schedule your appointment after we obtain authorization from your insurance company.  If you have not heard from [facility] within a week, please call [phone] to schedule your MRI.   Please be sure you have signed up for MyChart so that we can get your results to you.  We will be in touch with you as soon as we can.  Please know, it can take up to 3-4 business days for the radiologist and Dr. Alease Hunter to have time to review the results and determine the best plan of care.  If there is something that appears to be surgical or needs a referral to other specialists we will let you know through MyChart or telephone.  Otherwise we will plan to schedule a follow up appointment with Dr. Alease Hunter once we have the results.    See you back for MRI review.

## 2023-11-17 ENCOUNTER — Encounter: Payer: Self-pay | Admitting: Family Medicine

## 2023-11-22 ENCOUNTER — Ambulatory Visit: Payer: Self-pay | Admitting: Family Medicine

## 2023-11-22 ENCOUNTER — Ambulatory Visit
Admission: RE | Admit: 2023-11-22 | Discharge: 2023-11-22 | Disposition: A | Discharge status: Home / Self Care | Source: Ambulatory Visit | Attending: Family Medicine | Admitting: Family Medicine

## 2023-11-22 DIAGNOSIS — M4802 Spinal stenosis, cervical region: Secondary | ICD-10-CM | POA: Diagnosis not present

## 2023-11-22 DIAGNOSIS — M5126 Other intervertebral disc displacement, lumbar region: Secondary | ICD-10-CM | POA: Diagnosis not present

## 2023-11-22 DIAGNOSIS — R29898 Other symptoms and signs involving the musculoskeletal system: Secondary | ICD-10-CM

## 2023-11-22 DIAGNOSIS — M47816 Spondylosis without myelopathy or radiculopathy, lumbar region: Secondary | ICD-10-CM | POA: Diagnosis not present

## 2023-11-22 DIAGNOSIS — M542 Cervicalgia: Secondary | ICD-10-CM

## 2023-11-22 DIAGNOSIS — M5416 Radiculopathy, lumbar region: Secondary | ICD-10-CM

## 2023-11-22 DIAGNOSIS — M5412 Radiculopathy, cervical region: Secondary | ICD-10-CM

## 2023-11-22 NOTE — Progress Notes (Signed)
 Low back x-ray shows arthritis.

## 2023-11-22 NOTE — Progress Notes (Signed)
 Cervical spine x-ray shows little bit of arthritis and prior surgery.

## 2023-11-30 NOTE — Progress Notes (Signed)
 Cervical spine MRI shows a pinched spinal cord at the level just above where you have had neck surgery.  I am not optimistic that injections are going to help tremendously.  Recommend return to clinic to go over the results in full detail and discuss treatment plan and options including possible surgical referral.

## 2023-11-30 NOTE — Progress Notes (Signed)
 Lumbar spine MRI shows some areas of pinched nerve which could cause leg pain.  Recommend return to clinic to go over the results of full detail and discuss treatment plan and options.

## 2023-12-09 DIAGNOSIS — C61 Malignant neoplasm of prostate: Secondary | ICD-10-CM | POA: Diagnosis not present

## 2023-12-13 NOTE — Progress Notes (Unsigned)
   [name], PhD, LAT, ATC acting as a scribe for Artist Lloyd, MD.  Colin Love is a 78 y.o. male who presents to [facility] at [facility] today for f/u cervical and lumbar radiculitis w/ MRI review. Pt was last seen by Dr. Lloyd on 11/11/23 and was advised to cont gabapentin , XR were obtained, and MRI's ordered.  Today, pt reports ***  Dx imaging: 11/22/23 L-spine & c-spine MRI  11/11/23 L-spine & c-spine XR  Pertinent review of systems: ***  Relevant historical information: ***   Exam:  There were no vitals taken for this visit. General: Well Developed, well nourished, and in no acute distress.   MSK: ***    Lab and Radiology Results No results found for this or any previous visit (from the past 72 hours). No results found.     Assessment and Plan: 78 y.o. male with ***   PDMP not reviewed this encounter. No orders of the defined types were placed in this encounter.  No orders of the defined types were placed in this encounter.    Discussed warning signs or symptoms. Please see discharge instructions. Patient expresses understanding.   ***

## 2023-12-14 ENCOUNTER — Ambulatory Visit: Admitting: Family Medicine

## 2023-12-14 ENCOUNTER — Encounter: Payer: Self-pay | Admitting: Family Medicine

## 2023-12-14 VITALS — BP 102/62 | HR 93 | Ht 67.0 in | Wt 193.0 lb

## 2023-12-14 DIAGNOSIS — M5412 Radiculopathy, cervical region: Secondary | ICD-10-CM

## 2023-12-14 DIAGNOSIS — M5416 Radiculopathy, lumbar region: Secondary | ICD-10-CM

## 2023-12-14 NOTE — Patient Instructions (Addendum)
 Thank you for coming in today.   We've placed an order for an injection for your back and your neck with [facility] ([facility]). Someone from [facility] will reach out to assist with scheduling after confirming insurance benefits and prior authorization requirements.   If the back injections do not help, let us  know and we can refer you to Neurosurgery.   See you back as needed.

## 2023-12-16 DIAGNOSIS — Z8546 Personal history of malignant neoplasm of prostate: Secondary | ICD-10-CM | POA: Diagnosis not present

## 2023-12-16 DIAGNOSIS — Z8551 Personal history of malignant neoplasm of bladder: Secondary | ICD-10-CM | POA: Diagnosis not present

## 2023-12-16 DIAGNOSIS — N3289 Other specified disorders of bladder: Secondary | ICD-10-CM | POA: Diagnosis not present

## 2023-12-16 DIAGNOSIS — R399 Unspecified symptoms and signs involving the genitourinary system: Secondary | ICD-10-CM | POA: Diagnosis not present

## 2023-12-22 ENCOUNTER — Telehealth: Payer: Self-pay

## 2023-12-22 NOTE — Telephone Encounter (Signed)
   Pre-operative Risk Assessment    Patient Name: Colin Love  DOB: 1946/01/19 MRN: [id number]   Date of last office visit: 04/26/23 ONEIL PARCHMENT, MD Date of next office visit: NONE   Request for Surgical Clearance    Procedure:  LUMBAR/ CERVICAL EPIDURAL  Date of Surgery:  Clearance TBD                                Surgeon:  NOT INDICATED Surgeon's Group or Practice Name:  [facility] Phone number:  [phone] Fax number:  [phone]   Type of Clearance Requested:   - Medical  - Pharmacy:  Hold Apixaban  (Eliquis ) 4 DOSES BEFORE   Type of Anesthesia:  Not Indicated   Additional requests/questions:    Signed, Lucie DELENA Ku   12/22/2023, 5:45 PM

## 2023-12-25 ENCOUNTER — Other Ambulatory Visit: Payer: Self-pay | Admitting: Internal Medicine

## 2023-12-31 NOTE — Telephone Encounter (Signed)
 Patient with diagnosis of afib on Eliquis  for anticoagulation.    Procedure: LUMBAR/ CERVICAL EPIDURAL  Date of procedure: TBD   CHA2DS2-VASc Score = 4   This indicates a 4.8% annual risk of stroke. The patient's score is based upon: CHF History: 0 HTN History: 1 Diabetes History: 1 Stroke History: 0 Vascular Disease History: 0 Age Score: 2 Gender Score: 0      CrCl 56.99 ml/min Platelet count 154  Patient has not had an Afib/aflutter ablation in the last 3 months, DCCV within the last 4 weeks or a watchman implanted in the last 45 days   Per office protocol, patient can hold Eliquis  for 3 days prior to procedure.    **This guidance is not considered finalized until pre-operative APP has relayed final recommendations.**

## 2023-12-31 NOTE — Telephone Encounter (Signed)
   Name: Colin Love  DOB: 22-Oct-1945  MRN: [id number]  Primary Cardiologist: Oneil Parchment, MD   Preoperative team, please contact this patient and set up a phone call appointment for further preoperative risk assessment. Please obtain consent and complete medication review. Thank you for your help.  I confirm that guidance regarding antiplatelet and oral anticoagulation therapy has been completed and, if necessary, noted below.  Patient has not had an Afib/aflutter ablation in the last 3 months, DCCV within the last 4 weeks or a watchman implanted in the last 45 days    Per office protocol, patient can hold Eliquis  for 3 days prior to procedure.    I also confirmed the patient resides in the state of [place] . As per [facility] Medical Board telemedicine laws, the patient must reside in the state in which the provider is licensed.   Lamarr Satterfield, NP 12/31/2023, 11:47 AM [facility]

## 2024-01-03 ENCOUNTER — Telehealth (HOSPITAL_BASED_OUTPATIENT_CLINIC_OR_DEPARTMENT_OTHER): Payer: Self-pay | Admitting: *Deleted

## 2024-01-03 NOTE — Telephone Encounter (Signed)
 Patient called to follow-up on Pre-op tele-visit.

## 2024-01-03 NOTE — Telephone Encounter (Signed)
 Pt called back and has scheduled tele preop appt 01/12/24. Med rec and consent are done.      Patient Consent for Virtual Visit        Colin Love has provided verbal consent on 01/03/2024 for a virtual visit (video or telephone).   CONSENT FOR VIRTUAL VISIT FOR:  Colin Love  By participating in this virtual visit I agree to the following:  I hereby voluntarily request, consent and authorize [facility] and its employed or contracted physicians, physician assistants, nurse practitioners or other licensed health care professionals (the Practitioner), to provide me with telemedicine health care services (the "Services) as deemed necessary by the treating Practitioner. I acknowledge and consent to receive the Services by the Practitioner via telemedicine. I understand that the telemedicine visit will involve communicating with the Practitioner through live audiovisual communication technology and the disclosure of certain medical information by electronic transmission. I acknowledge that I have been given the opportunity to request an in-person assessment or other available alternative prior to the telemedicine visit and am voluntarily participating in the telemedicine visit.  I understand that I have the right to withhold or withdraw my consent to the use of telemedicine in the course of my care at any time, without affecting my right to future care or treatment, and that the Practitioner or I may terminate the telemedicine visit at any time. I understand that I have the right to inspect all information obtained and/or recorded in the course of the telemedicine visit and may receive copies of available information for a reasonable fee.  I understand that some of the potential risks of receiving the Services via telemedicine include:  Delay or interruption in medical evaluation due to technological equipment failure or disruption; Information transmitted may not be sufficient  (e.g. poor resolution of images) to allow for appropriate medical decision making by the Practitioner; and/or  In rare instances, security protocols could fail, causing a breach of personal health information.  Furthermore, I acknowledge that it is my responsibility to provide information about my medical history, conditions and care that is complete and accurate to the best of my ability. I acknowledge that Practitioner's advice, recommendations, and/or decision may be based on factors not within their control, such as incomplete or inaccurate data provided by me or distortions of diagnostic images or specimens that may result from electronic transmissions. I understand that the practice of medicine is not an exact science and that Practitioner makes no warranties or guarantees regarding treatment outcomes. I acknowledge that a copy of this consent can be made available to me via my patient portal [facility] MyChart), or I can request a printed copy by calling the office of [facility].    I understand that my insurance will be billed for this visit.   I have read or had this consent read to me. I understand the contents of this consent, which adequately explains the benefits and risks of the Services being provided via telemedicine.  I have been provided ample opportunity to ask questions regarding this consent and the Services and have had my questions answered to my satisfaction. I give my informed consent for the services to be provided through the use of telemedicine in my medical care

## 2024-01-03 NOTE — Telephone Encounter (Signed)
 Pt called back and has scheduled tele preop appt 01/12/24. Med rec and consent are done.

## 2024-01-12 ENCOUNTER — Ambulatory Visit: Attending: Cardiology | Admitting: Physician Assistant

## 2024-01-12 DIAGNOSIS — Z01810 Encounter for preprocedural cardiovascular examination: Secondary | ICD-10-CM

## 2024-01-12 NOTE — Progress Notes (Signed)
 Virtual Visit via Telephone Note   Because of Colin Love co-morbid illnesses, he is at least at moderate risk for complications without adequate follow up.  This format is felt to be most appropriate for this patient at this time.  Due to technical limitations with video connection (technology), today's appointment will be conducted as an audio only telehealth visit, and Colin Love verbally agreed to proceed in this manner.   All issues noted in this document were discussed and addressed.  No physical exam could be performed with this format.  Evaluation Performed:  Preoperative cardiovascular risk assessment _____________   Date:  01/12/2024   Patient ID:  Colin Love, DOB Oct 08, 1945, MRN [id number] Patient Location:  Home Provider location:   Office  Primary Care Provider:  Geofm Glade PARAS, MD Primary Cardiologist:  Colin Parchment, MD  Chief Complaint / Patient Profile   78 y.o. y/o male with a h/o longstanding persistent atrial fibrillation, hypertension, hyperlipidemia, PACs, murmur, type 2 diabetes mellitus who is pending lumbar/cervical epidural and presents today for telephonic preoperative cardiovascular risk assessment.  History of Present Illness    Colin Love is a 78 y.o. male who presents via audio/video conferencing for a telehealth visit today.  Pt was last seen in cardiology clinic on 04/26/2023 by Dr. Parchment.  At that time Colin Love was doing well and was well-controlled on his diltiazem .  The patient is now pending procedure as outlined above. Since his last visit, he has not had CP or SOB. No issues with Afib and BP has been well controlled. He works a part time job and its a pretty active job. He does have some leg pain and that is coming from his back.   Per office protocol, patient can hold Eliquis  for 3 days prior to procedure.  Please resume medically safe to do so.   Past Medical History    Past Medical History:  Diagnosis Date    Adenoma of both adrenal glands    Anticoagulant long-term use    eliquis -- managed by cardiology   Arthritis    BPH (benign prostatic hypertrophy)    Diverticulosis of colon    ED (erectile dysfunction)    Full dentures    GERD (gastroesophageal reflux disease)    Glaucoma, both eyes    History of adenomatous polyp of colon    History of bladder cancer 02/06/2014   local, low grade pap uro carcinoma  s/p  TURBT   History of diverticulitis of colon    History of Helicobacter pylori infection 2012   treated   History of sepsis 12/25/2021   admission in epic w/ CAP   Hyperlipidemia    Hypertension    Malignant neoplasm prostate (HCC) 07/2022   urologist--- dr herrick/  radiation oncologist-- dr patrcia;  dx 05/ 2024, gleason 4+3, PSA 10.0, vol 35cc   Murmur, cardiac    Neuropathy    PAC (premature atrial contraction)    Persistent atrial fibrillation [facility])    cardiologist--- dr Love;   dccv 04-24-2021   Type 2 diabetes mellitus (HCC)    followed by pcp;   (01-01-2023  per pt checks blood sugar 3 times per week,  average fasting 120s)   Past Surgical History:  Procedure Laterality Date   ANTERIOR CERVICAL DECOMP/DISCECTOMY FUSION  05/24/2003   C4  --  C7   CARDIOVERSION N/A 04/24/2021   Procedure: CARDIOVERSION;  Surgeon: Alveta Aleene PARAS, MD;  Location: [facility] ENDOSCOPY;  Service: Cardiovascular;  Laterality: N/A;   COLONOSCOPY WITH ESOPHAGOGASTRODUODENOSCOPY (EGD)     last 07-24-2013   CYSTOSCOPY W/ RETROGRADES Bilateral 02/09/2014   Procedure: BILATERAL RETROGRADE PYELOGRAM , Cystoscopy;  Surgeon: Morene LELON Salines, MD;  Location: [facility];  Service: Urology;  Laterality: Bilateral;   RADIOACTIVE SEED IMPLANT N/A 01/06/2023   Procedure: RADIOACTIVE SEED IMPLANT/BRACHYTHERAPY IMPLANT;  Surgeon: Salines Morene LELON, MD;  Location: [facility];  Service: Urology;  Laterality: N/A;   SPACE OAR INSTILLATION N/A 01/06/2023   Procedure: SPACE OAR  INSTILLATION;  Surgeon: Salines Morene LELON, MD;  Location: [facility];  Service: Urology;  Laterality: N/A;   TRANSURETHRAL RESECTION OF BLADDER TUMOR WITH GYRUS (TURBT-GYRUS) N/A 02/09/2014   Procedure: Bladder Biopsies;  Surgeon: Morene LELON Salines, MD;  Location: [facility];  Service: Urology;  Laterality: N/A;    Allergies  No Known Allergies  Home Medications    Prior to Admission medications   Medication Sig Start Date End Date Taking? Authorizing Provider  ACETAMINOPHEN  PO Take 650 mg by mouth every 6 (six) hours as needed for mild pain (pain score 1-3).    [provider]  apixaban  (ELIQUIS ) 5 MG TABS tablet Take 1 tablet (5 mg total) by mouth 2 (two) times daily. 08/18/23   Colin Colin BROCKS, MD  atorvastatin  (LIPITOR) 20 MG tablet TAKE 1 TABLET BY MOUTH ONCE DAILY AT 6PM 07/05/23   Colin Glade PARAS, MD  calcium  carbonate (TUMS - DOSED IN MG ELEMENTAL CALCIUM ) 500 MG chewable tablet Chew 1 tablet by mouth as needed for indigestion or heartburn.    [provider]  diclofenac Sodium (VOLTAREN) 1 % GEL Apply 2 g topically 4 (four) times daily as needed (pain).    [provider]  diltiazem  (CARDIZEM  CD) 180 MG 24 hr capsule TAKE 1 CAPSULE BY MOUTH TWICE A DAY 07/06/23   Burns, Glade PARAS, MD  gabapentin  (NEURONTIN ) 100 MG capsule Take 1-2 capsules (100-200 mg total) by mouth 2 (two) times daily as needed. 11/04/23   Colin Glade PARAS, MD  latanoprost  (XALATAN ) 0.005 % ophthalmic solution Place 1 drop into both eyes at bedtime. 10/17/15   [provider]  lisinopril -hydrochlorothiazide  (ZESTORETIC ) 20-12.5 MG tablet TAKE 1 TABLET BY MOUTH TWICE A DAY 07/05/23   Burns, Glade PARAS, MD  metFORMIN  (GLUCOPHAGE ) 500 MG tablet TAKE 1 TABLET BY MOUTH 2 TIMES DAILY WITH A MEAL. PLEASE SCHEDULE OFFICE VISIT FOR FURTHER REFILLS. 12/27/23   Colin Glade PARAS, MD  potassium chloride  (KLOR-CON ) 10 MEQ tablet TAKE 3 TABLETS BY MOUTH DAILY. 09/01/23   Colin Glade PARAS, MD  sildenafil (VIAGRA) 100 MG tablet Take 100 mg by mouth daily as needed for erectile dysfunction.    [provider]  terazosin  (HYTRIN ) 10 MG capsule TAKE 1 CAPSULE BY MOUTH EVERYDAY AT BEDTIME. PLEASE SCHEDULE OFFICE VISIT FOR FURTHER REFILLS 11/01/23   Colin Glade PARAS, MD    Physical Exam    Vital Signs:  [facility] does not have vital signs available for review today.  Given telephonic nature of communication, physical exam is limited. AAOx3. NAD. Normal affect.  Speech and respirations are unlabored.  Accessory Clinical Findings    None  Assessment & Plan    1.  Preoperative Cardiovascular Risk Assessment:  Mr. Marbach perioperative risk of a major cardiac event is 0.9% according to the Revised Cardiac Risk Index (RCRI).  Therefore, he is at low risk for perioperative complications.   His functional capacity is  good at 5.62 METs according to the Duke Activity Status Index (DASI). Recommendations: According to ACC/AHA guidelines, no further cardiovascular testing needed.  The patient may proceed to surgery at acceptable risk.   Antiplatelet and/or Anticoagulation Recommendations:  Eliquis  (Apixaban ) can be held for 3 days prior to surgery.  Please resume post op when felt to be safe.    The patient was advised that if he develops new symptoms prior to surgery to contact our office to arrange for a follow-up visit, and he verbalized understanding.  A copy of this note will be routed to requesting surgeon.  Time:   Today, I have spent 9 minutes with the patient with telehealth technology discussing medical history, symptoms, and management plan.     Orren LOISE Fabry, PA-C  01/12/2024, 2:14 PM

## 2024-01-26 NOTE — Discharge Instructions (Signed)

## 2024-01-27 ENCOUNTER — Ambulatory Visit
Admission: RE | Admit: 2024-01-27 | Discharge: 2024-01-27 | Disposition: A | Discharge status: Home / Self Care | Source: Ambulatory Visit | Attending: Family Medicine | Admitting: Family Medicine

## 2024-01-27 DIAGNOSIS — M5412 Radiculopathy, cervical region: Secondary | ICD-10-CM | POA: Diagnosis not present

## 2024-01-27 MED ORDER — IOPAMIDOL (ISOVUE-M 300) INJECTION 61%
1.0000 mL | Freq: Once | INTRAMUSCULAR | Status: AC | PRN
  Administered 2024-01-27: 1 mL via INTRATHECAL

## 2024-01-27 MED ORDER — TRIAMCINOLONE ACETONIDE 40 MG/ML IJ SUSP (RADIOLOGY)
60.0000 mg | Freq: Once | INTRAMUSCULAR | Status: AC
  Administered 2024-01-27: 60 mg via EPIDURAL

## 2024-01-28 ENCOUNTER — Other Ambulatory Visit: Payer: Self-pay | Admitting: Internal Medicine

## 2024-02-10 NOTE — Discharge Instructions (Signed)

## 2024-02-11 ENCOUNTER — Inpatient Hospital Stay
Admission: RE | Admit: 2024-02-11 | Discharge: 2024-02-11 | Disposition: A | Discharge status: Home / Self Care | Source: Ambulatory Visit | Attending: Family Medicine | Admitting: Family Medicine

## 2024-02-18 NOTE — Discharge Instructions (Signed)

## 2024-02-20 ENCOUNTER — Other Ambulatory Visit: Payer: Self-pay | Admitting: Internal Medicine

## 2024-02-21 ENCOUNTER — Ambulatory Visit
Admission: RE | Admit: 2024-02-21 | Discharge: 2024-02-21 | Disposition: A | Discharge status: Home / Self Care | Source: Ambulatory Visit | Attending: Family Medicine | Admitting: Family Medicine

## 2024-02-21 DIAGNOSIS — M5416 Radiculopathy, lumbar region: Secondary | ICD-10-CM

## 2024-02-21 MED ORDER — IOPAMIDOL (ISOVUE-M 200) INJECTION 41%
1.0000 mL | Freq: Once | INTRAMUSCULAR | Status: AC
  Administered 2024-02-21: 10:00:00 1 mL via EPIDURAL

## 2024-02-21 MED ORDER — METHYLPREDNISOLONE ACETATE 40 MG/ML INJ SUSP (RADIOLOG
80.0000 mg | Freq: Once | INTRAMUSCULAR | Status: AC
  Administered 2024-02-21: 10:00:00 80 mg via EPIDURAL

## 2024-02-27 ENCOUNTER — Other Ambulatory Visit: Payer: Self-pay | Admitting: Cardiology

## 2024-02-27 DIAGNOSIS — I4819 Other persistent atrial fibrillation: Secondary | ICD-10-CM

## 2024-02-28 NOTE — Telephone Encounter (Signed)
 Eliquis  5mg  refill request received. Patient is 78 years old, weight-87.5kg, Crea-1.11 on 11/04/23, Diagnosis-Afib, and last seen by Orren Fabry on 01/12/24 via TeleVisit. Dose is appropriate based on dosing criteria. Will send in refill to requested pharmacy.

## 2024-05-16 ENCOUNTER — Ambulatory Visit (HOSPITAL_BASED_OUTPATIENT_CLINIC_OR_DEPARTMENT_OTHER): Admitting: Cardiology

## 2024-08-03 ENCOUNTER — Ambulatory Visit
